# Patient Record
Sex: Female | Born: 1955 | Race: White | Hispanic: No | Marital: Married | Smoking: Former smoker
Health system: Southern US, Community
[De-identification: ages and names within clinical notes are randomized; demographics above are authoritative.]

## PROBLEM LIST (undated history)

## (undated) DIAGNOSIS — E669 Obesity, unspecified: Secondary | ICD-10-CM

## (undated) DIAGNOSIS — K760 Fatty (change of) liver, not elsewhere classified: Secondary | ICD-10-CM

## (undated) DIAGNOSIS — F419 Anxiety disorder, unspecified: Secondary | ICD-10-CM

## (undated) DIAGNOSIS — I10 Essential (primary) hypertension: Secondary | ICD-10-CM

## (undated) DIAGNOSIS — N200 Calculus of kidney: Secondary | ICD-10-CM

## (undated) HISTORY — PX: ABDOMINAL HYSTERECTOMY: SHX81

## (undated) HISTORY — DX: Obesity, unspecified: E66.9

## (undated) HISTORY — DX: Calculus of kidney: N20.0

## (undated) HISTORY — DX: Anxiety disorder, unspecified: F41.9

## (undated) HISTORY — DX: Essential (primary) hypertension: I10

## (undated) HISTORY — DX: Fatty (change of) liver, not elsewhere classified: K76.0

---

## 2006-02-03 ENCOUNTER — Ambulatory Visit: Payer: Self-pay | Admitting: Family Medicine

## 2006-03-27 ENCOUNTER — Ambulatory Visit: Payer: Self-pay | Admitting: Family Medicine

## 2006-04-14 ENCOUNTER — Ambulatory Visit: Payer: Self-pay | Admitting: Family Medicine

## 2006-05-16 DIAGNOSIS — E785 Hyperlipidemia, unspecified: Secondary | ICD-10-CM | POA: Insufficient documentation

## 2006-05-16 DIAGNOSIS — E118 Type 2 diabetes mellitus with unspecified complications: Secondary | ICD-10-CM

## 2006-05-16 DIAGNOSIS — I10 Essential (primary) hypertension: Secondary | ICD-10-CM | POA: Insufficient documentation

## 2006-05-16 DIAGNOSIS — IMO0002 Reserved for concepts with insufficient information to code with codable children: Secondary | ICD-10-CM | POA: Insufficient documentation

## 2006-05-16 DIAGNOSIS — E1165 Type 2 diabetes mellitus with hyperglycemia: Secondary | ICD-10-CM

## 2006-05-23 ENCOUNTER — Ambulatory Visit: Payer: Self-pay | Admitting: Family Medicine

## 2006-05-26 ENCOUNTER — Ambulatory Visit: Payer: Self-pay | Admitting: Family Medicine

## 2006-05-29 ENCOUNTER — Ambulatory Visit: Payer: Self-pay | Admitting: Family Medicine

## 2006-06-20 ENCOUNTER — Telehealth: Payer: Self-pay | Admitting: Family Medicine

## 2006-06-22 ENCOUNTER — Telehealth: Payer: Self-pay | Admitting: Family Medicine

## 2006-06-22 ENCOUNTER — Ambulatory Visit: Payer: Self-pay | Admitting: Family Medicine

## 2006-06-27 ENCOUNTER — Telehealth (INDEPENDENT_AMBULATORY_CARE_PROVIDER_SITE_OTHER): Payer: Self-pay | Admitting: *Deleted

## 2006-07-17 ENCOUNTER — Ambulatory Visit: Payer: Self-pay | Admitting: Family Medicine

## 2006-07-17 LAB — CONVERTED CEMR LAB
Glucose, Urine, Semiquant: 100
Nitrite: NEGATIVE
Protein, U semiquant: >=300
Urobilinogen, UA: 0.2

## 2006-07-21 ENCOUNTER — Telehealth: Payer: Self-pay | Admitting: Family Medicine

## 2006-07-26 ENCOUNTER — Telehealth: Payer: Self-pay | Admitting: Family Medicine

## 2006-08-04 ENCOUNTER — Telehealth: Payer: Self-pay | Admitting: Family Medicine

## 2006-09-21 ENCOUNTER — Ambulatory Visit: Payer: Self-pay | Admitting: Family Medicine

## 2006-09-21 LAB — CONVERTED CEMR LAB: Microalbumin U total vol: ABNORMAL mg/L

## 2006-10-02 ENCOUNTER — Encounter: Payer: Self-pay | Admitting: Family Medicine

## 2006-10-19 ENCOUNTER — Encounter (INDEPENDENT_AMBULATORY_CARE_PROVIDER_SITE_OTHER): Payer: Self-pay | Admitting: Specialist

## 2006-10-19 ENCOUNTER — Ambulatory Visit (HOSPITAL_COMMUNITY): Admission: RE | Admit: 2006-10-19 | Discharge: 2006-10-19 | Payer: Self-pay | Admitting: Surgery

## 2006-10-19 ENCOUNTER — Encounter: Payer: Self-pay | Admitting: Family Medicine

## 2006-10-25 ENCOUNTER — Ambulatory Visit: Payer: Self-pay | Admitting: Family Medicine

## 2006-10-26 ENCOUNTER — Telehealth (INDEPENDENT_AMBULATORY_CARE_PROVIDER_SITE_OTHER): Payer: Self-pay | Admitting: *Deleted

## 2006-10-26 ENCOUNTER — Encounter: Payer: Self-pay | Admitting: Family Medicine

## 2006-10-26 LAB — CONVERTED CEMR LAB
ALT: 24 units/L (ref 0–35)
AST: 15 units/L (ref 0–37)
Albumin: 4.2 g/dL (ref 3.5–5.2)
Alkaline Phosphatase: 76 units/L (ref 39–117)
BUN: 17 mg/dL (ref 6–23)
MCHC: 33.3 g/dL (ref 30.0–36.0)
Platelets: 341 10*3/uL (ref 150–400)
Potassium: 4.4 meq/L (ref 3.5–5.3)
RDW: 13.7 % (ref 11.5–14.0)
Sodium: 138 meq/L (ref 135–145)

## 2006-10-30 ENCOUNTER — Encounter: Payer: Self-pay | Admitting: Family Medicine

## 2006-10-30 LAB — CONVERTED CEMR LAB
HDL: 43 mg/dL (ref >39)
LDL Cholesterol: 86 mg/dL (ref 0–99)

## 2006-11-07 ENCOUNTER — Emergency Department (HOSPITAL_COMMUNITY): Admission: EM | Admit: 2006-11-07 | Discharge: 2006-11-07 | Payer: Self-pay | Admitting: Emergency Medicine

## 2006-11-07 ENCOUNTER — Telehealth: Payer: Self-pay | Admitting: Family Medicine

## 2006-11-27 ENCOUNTER — Telehealth: Payer: Self-pay | Admitting: Family Medicine

## 2006-12-26 ENCOUNTER — Ambulatory Visit: Payer: Self-pay | Admitting: Family Medicine

## 2006-12-26 DIAGNOSIS — E669 Obesity, unspecified: Secondary | ICD-10-CM | POA: Insufficient documentation

## 2006-12-27 ENCOUNTER — Encounter: Payer: Self-pay | Admitting: Family Medicine

## 2006-12-27 LAB — CONVERTED CEMR LAB
CO2: 21 meq/L (ref 19–32)
Calcium: 10.3 mg/dL (ref 8.4–10.5)
Sodium: 141 meq/L (ref 135–145)

## 2007-01-02 ENCOUNTER — Telehealth: Payer: Self-pay | Admitting: Family Medicine

## 2007-01-02 ENCOUNTER — Encounter: Payer: Self-pay | Admitting: Family Medicine

## 2007-01-10 ENCOUNTER — Telehealth: Payer: Self-pay | Admitting: Family Medicine

## 2007-01-10 ENCOUNTER — Ambulatory Visit: Payer: Self-pay | Admitting: Family Medicine

## 2007-01-10 LAB — CONVERTED CEMR LAB: Hgb A1c MFr Bld: 7 %

## 2007-01-11 ENCOUNTER — Telehealth: Payer: Self-pay | Admitting: Family Medicine

## 2007-01-15 ENCOUNTER — Telehealth: Payer: Self-pay | Admitting: Family Medicine

## 2007-01-17 ENCOUNTER — Encounter: Payer: Self-pay | Admitting: Family Medicine

## 2007-01-17 ENCOUNTER — Encounter: Admission: RE | Admit: 2007-01-17 | Discharge: 2007-01-17 | Payer: Self-pay | Admitting: Family Medicine

## 2007-01-23 ENCOUNTER — Telehealth: Payer: Self-pay | Admitting: Family Medicine

## 2007-02-07 ENCOUNTER — Ambulatory Visit: Payer: Self-pay | Admitting: Family Medicine

## 2007-02-12 ENCOUNTER — Telehealth: Payer: Self-pay | Admitting: Family Medicine

## 2007-02-20 ENCOUNTER — Telehealth (INDEPENDENT_AMBULATORY_CARE_PROVIDER_SITE_OTHER): Payer: Self-pay | Admitting: *Deleted

## 2007-02-21 ENCOUNTER — Encounter: Admission: RE | Admit: 2007-02-21 | Discharge: 2007-02-21 | Payer: Self-pay | Admitting: Oncology

## 2007-03-14 ENCOUNTER — Ambulatory Visit: Payer: Self-pay | Admitting: Family Medicine

## 2007-03-14 DIAGNOSIS — F411 Generalized anxiety disorder: Secondary | ICD-10-CM | POA: Insufficient documentation

## 2007-04-06 ENCOUNTER — Telehealth: Payer: Self-pay | Admitting: Family Medicine

## 2007-04-16 ENCOUNTER — Ambulatory Visit (HOSPITAL_COMMUNITY): Payer: Self-pay | Admitting: Psychiatry

## 2007-04-19 ENCOUNTER — Telehealth: Payer: Self-pay | Admitting: Family Medicine

## 2007-04-30 ENCOUNTER — Ambulatory Visit (HOSPITAL_COMMUNITY): Payer: Self-pay | Admitting: Psychology

## 2007-05-02 ENCOUNTER — Telehealth: Payer: Self-pay | Admitting: Family Medicine

## 2007-05-04 ENCOUNTER — Ambulatory Visit: Payer: Self-pay | Admitting: Family Medicine

## 2007-05-04 LAB — CONVERTED CEMR LAB: Hgb A1c MFr Bld: 6.5 %

## 2007-05-30 ENCOUNTER — Ambulatory Visit: Payer: Self-pay | Admitting: Family Medicine

## 2007-06-08 ENCOUNTER — Telehealth: Payer: Self-pay | Admitting: Family Medicine

## 2007-11-13 ENCOUNTER — Ambulatory Visit: Payer: Self-pay | Admitting: Family Medicine

## 2007-11-13 DIAGNOSIS — R809 Proteinuria, unspecified: Secondary | ICD-10-CM | POA: Insufficient documentation

## 2007-11-13 LAB — CONVERTED CEMR LAB: Hgb A1c MFr Bld: 7.1 %

## 2007-12-24 ENCOUNTER — Telehealth (INDEPENDENT_AMBULATORY_CARE_PROVIDER_SITE_OTHER): Payer: Self-pay | Admitting: *Deleted

## 2008-01-14 ENCOUNTER — Ambulatory Visit: Payer: Self-pay | Admitting: Family Medicine

## 2008-01-14 DIAGNOSIS — L570 Actinic keratosis: Secondary | ICD-10-CM | POA: Insufficient documentation

## 2008-01-25 ENCOUNTER — Telehealth (INDEPENDENT_AMBULATORY_CARE_PROVIDER_SITE_OTHER): Payer: Self-pay | Admitting: *Deleted

## 2008-01-29 ENCOUNTER — Encounter: Payer: Self-pay | Admitting: Family Medicine

## 2008-01-31 LAB — CONVERTED CEMR LAB
CO2: 21 meq/L (ref 19–32)
Cholesterol: 198 mg/dL (ref 0–200)
Creatinine, Ser: 0.95 mg/dL (ref 0.40–1.20)
Glucose, Bld: 200 mg/dL — ABNORMAL HIGH (ref 70–99)
Total Bilirubin: 0.7 mg/dL (ref 0.3–1.2)
Total CHOL/HDL Ratio: 5.1
Triglycerides: 156 mg/dL — ABNORMAL HIGH (ref <150)
VLDL: 31 mg/dL (ref 0–40)

## 2008-02-01 ENCOUNTER — Encounter: Admission: RE | Admit: 2008-02-01 | Discharge: 2008-02-01 | Payer: Self-pay | Admitting: Family Medicine

## 2008-02-01 ENCOUNTER — Ambulatory Visit: Payer: Self-pay | Admitting: Family Medicine

## 2008-02-12 ENCOUNTER — Telehealth: Payer: Self-pay | Admitting: Family Medicine

## 2008-02-13 ENCOUNTER — Ambulatory Visit: Payer: Self-pay | Admitting: Family Medicine

## 2008-03-03 ENCOUNTER — Encounter: Admission: RE | Admit: 2008-03-03 | Discharge: 2008-03-03 | Payer: Self-pay | Admitting: Family Medicine

## 2008-04-24 ENCOUNTER — Ambulatory Visit: Payer: Self-pay | Admitting: Family Medicine

## 2008-05-21 ENCOUNTER — Telehealth (INDEPENDENT_AMBULATORY_CARE_PROVIDER_SITE_OTHER): Payer: Self-pay | Admitting: *Deleted

## 2008-06-27 ENCOUNTER — Encounter: Payer: Self-pay | Admitting: Family Medicine

## 2008-08-08 HISTORY — PX: LITHOTRIPSY: SUR834

## 2008-09-16 ENCOUNTER — Ambulatory Visit: Payer: Self-pay | Admitting: Family Medicine

## 2008-09-16 ENCOUNTER — Encounter: Admission: RE | Admit: 2008-09-16 | Discharge: 2008-09-16 | Payer: Self-pay | Admitting: Family Medicine

## 2008-12-15 ENCOUNTER — Ambulatory Visit: Payer: Self-pay | Admitting: Family Medicine

## 2008-12-15 LAB — CONVERTED CEMR LAB: Hgb A1c MFr Bld: 7.1 %

## 2008-12-21 ENCOUNTER — Telehealth: Payer: Self-pay | Admitting: Family Medicine

## 2009-01-15 ENCOUNTER — Telehealth: Payer: Self-pay | Admitting: Family Medicine

## 2009-02-13 ENCOUNTER — Ambulatory Visit: Payer: Self-pay | Admitting: Family Medicine

## 2009-02-13 ENCOUNTER — Other Ambulatory Visit: Admission: RE | Admit: 2009-02-13 | Discharge: 2009-02-13 | Payer: Self-pay | Admitting: Family Medicine

## 2009-02-13 ENCOUNTER — Encounter: Payer: Self-pay | Admitting: Family Medicine

## 2009-02-13 DIAGNOSIS — Z780 Asymptomatic menopausal state: Secondary | ICD-10-CM | POA: Insufficient documentation

## 2009-02-14 ENCOUNTER — Encounter: Payer: Self-pay | Admitting: Family Medicine

## 2009-02-16 LAB — CONVERTED CEMR LAB
ALT: 27 units/L (ref 0–35)
CO2: 24 meq/L (ref 19–32)
Calcium: 9.5 mg/dL (ref 8.4–10.5)
Chloride: 107 meq/L (ref 96–112)
Cholesterol: 195 mg/dL (ref 0–200)
Sodium: 141 meq/L (ref 135–145)
Total Protein: 7.1 g/dL (ref 6.0–8.3)
VLDL: 35 mg/dL (ref 0–40)

## 2009-02-23 ENCOUNTER — Telehealth: Payer: Self-pay | Admitting: Family Medicine

## 2009-04-06 ENCOUNTER — Telehealth: Payer: Self-pay | Admitting: Family Medicine

## 2009-04-20 ENCOUNTER — Encounter: Admission: RE | Admit: 2009-04-20 | Discharge: 2009-04-20 | Payer: Self-pay | Admitting: Family Medicine

## 2009-04-20 ENCOUNTER — Encounter: Payer: Self-pay | Admitting: Family Medicine

## 2009-04-22 ENCOUNTER — Encounter: Payer: Self-pay | Admitting: Family Medicine

## 2009-04-22 LAB — HM COLONOSCOPY

## 2009-04-23 ENCOUNTER — Encounter: Payer: Self-pay | Admitting: Family Medicine

## 2009-04-23 DIAGNOSIS — K5730 Diverticulosis of large intestine without perforation or abscess without bleeding: Secondary | ICD-10-CM | POA: Insufficient documentation

## 2009-04-23 DIAGNOSIS — D126 Benign neoplasm of colon, unspecified: Secondary | ICD-10-CM | POA: Insufficient documentation

## 2009-04-27 ENCOUNTER — Encounter: Payer: Self-pay | Admitting: Family Medicine

## 2009-06-02 ENCOUNTER — Encounter: Admission: RE | Admit: 2009-06-02 | Discharge: 2009-06-02 | Payer: Self-pay | Admitting: Family Medicine

## 2009-06-02 ENCOUNTER — Ambulatory Visit: Payer: Self-pay | Admitting: Family Medicine

## 2009-06-02 DIAGNOSIS — R059 Cough, unspecified: Secondary | ICD-10-CM | POA: Insufficient documentation

## 2009-06-02 DIAGNOSIS — IMO0002 Reserved for concepts with insufficient information to code with codable children: Secondary | ICD-10-CM | POA: Insufficient documentation

## 2009-06-02 DIAGNOSIS — R0902 Hypoxemia: Secondary | ICD-10-CM | POA: Insufficient documentation

## 2009-06-02 DIAGNOSIS — R05 Cough: Secondary | ICD-10-CM

## 2009-06-04 ENCOUNTER — Encounter: Admission: RE | Admit: 2009-06-04 | Discharge: 2009-08-03 | Payer: Self-pay | Admitting: Family Medicine

## 2009-06-16 ENCOUNTER — Ambulatory Visit: Payer: Self-pay | Admitting: Family Medicine

## 2009-06-25 ENCOUNTER — Telehealth: Payer: Self-pay | Admitting: Family Medicine

## 2009-06-26 ENCOUNTER — Encounter: Payer: Self-pay | Admitting: Family Medicine

## 2009-09-11 ENCOUNTER — Ambulatory Visit: Payer: Self-pay | Admitting: Family Medicine

## 2009-09-21 ENCOUNTER — Telehealth: Payer: Self-pay | Admitting: Family Medicine

## 2009-09-24 ENCOUNTER — Telehealth: Payer: Self-pay | Admitting: Family Medicine

## 2009-09-30 ENCOUNTER — Telehealth: Payer: Self-pay | Admitting: Family Medicine

## 2009-10-05 ENCOUNTER — Telehealth: Payer: Self-pay | Admitting: Family Medicine

## 2009-10-08 ENCOUNTER — Telehealth (INDEPENDENT_AMBULATORY_CARE_PROVIDER_SITE_OTHER): Payer: Self-pay | Admitting: *Deleted

## 2009-10-13 ENCOUNTER — Ambulatory Visit: Payer: Self-pay | Admitting: Family Medicine

## 2009-10-13 DIAGNOSIS — J209 Acute bronchitis, unspecified: Secondary | ICD-10-CM | POA: Insufficient documentation

## 2009-10-16 ENCOUNTER — Telehealth: Payer: Self-pay | Admitting: Family Medicine

## 2009-10-29 ENCOUNTER — Ambulatory Visit: Payer: Self-pay | Admitting: Family Medicine

## 2009-10-29 ENCOUNTER — Encounter: Admission: RE | Admit: 2009-10-29 | Discharge: 2009-10-29 | Payer: Self-pay | Admitting: Family Medicine

## 2009-10-29 DIAGNOSIS — IMO0002 Reserved for concepts with insufficient information to code with codable children: Secondary | ICD-10-CM | POA: Insufficient documentation

## 2009-11-02 ENCOUNTER — Telehealth: Payer: Self-pay | Admitting: Family Medicine

## 2009-11-09 ENCOUNTER — Ambulatory Visit: Payer: Self-pay | Admitting: Family Medicine

## 2009-12-17 ENCOUNTER — Encounter: Payer: Self-pay | Admitting: Family Medicine

## 2009-12-22 ENCOUNTER — Ambulatory Visit: Payer: Self-pay | Admitting: Family Medicine

## 2010-02-01 LAB — HM DIABETES EYE EXAM

## 2010-02-04 ENCOUNTER — Encounter: Payer: Self-pay | Admitting: Family Medicine

## 2010-02-11 ENCOUNTER — Telehealth (INDEPENDENT_AMBULATORY_CARE_PROVIDER_SITE_OTHER): Payer: Self-pay | Admitting: *Deleted

## 2010-02-27 ENCOUNTER — Ambulatory Visit: Payer: Self-pay | Admitting: Emergency Medicine

## 2010-02-27 DIAGNOSIS — E1165 Type 2 diabetes mellitus with hyperglycemia: Secondary | ICD-10-CM

## 2010-02-27 DIAGNOSIS — IMO0001 Reserved for inherently not codable concepts without codable children: Secondary | ICD-10-CM | POA: Insufficient documentation

## 2010-02-27 LAB — CONVERTED CEMR LAB
Bilirubin Urine: NEGATIVE
Blood Glucose, Fingerstick: 436
Ketones, urine, test strip: NEGATIVE
Nitrite: NEGATIVE
Protein, U semiquant: 30
Time of First Serial CBG: 1035
WBC Urine, dipstick: NEGATIVE

## 2010-02-28 ENCOUNTER — Encounter: Payer: Self-pay | Admitting: Emergency Medicine

## 2010-03-19 ENCOUNTER — Ambulatory Visit: Payer: Self-pay | Admitting: Family Medicine

## 2010-03-19 LAB — CONVERTED CEMR LAB
Albumin/Creatinine Ratio, Urine, POC: >300
Creatinine,U: 50 mg/dL
Hgb A1c MFr Bld: 10.8 %

## 2010-03-22 ENCOUNTER — Telehealth: Payer: Self-pay | Admitting: Family Medicine

## 2010-03-24 ENCOUNTER — Telehealth (INDEPENDENT_AMBULATORY_CARE_PROVIDER_SITE_OTHER): Payer: Self-pay | Admitting: *Deleted

## 2010-03-26 ENCOUNTER — Telehealth: Payer: Self-pay | Admitting: Family Medicine

## 2010-03-31 ENCOUNTER — Encounter: Payer: Self-pay | Admitting: Family Medicine

## 2010-03-31 ENCOUNTER — Telehealth (INDEPENDENT_AMBULATORY_CARE_PROVIDER_SITE_OTHER): Payer: Self-pay | Admitting: *Deleted

## 2010-04-23 ENCOUNTER — Ambulatory Visit: Payer: Self-pay | Admitting: Family Medicine

## 2010-04-26 ENCOUNTER — Ambulatory Visit: Payer: Self-pay | Admitting: Family Medicine

## 2010-04-26 DIAGNOSIS — R7309 Other abnormal glucose: Secondary | ICD-10-CM | POA: Insufficient documentation

## 2010-04-26 DIAGNOSIS — R748 Abnormal levels of other serum enzymes: Secondary | ICD-10-CM | POA: Insufficient documentation

## 2010-04-26 LAB — CONVERTED CEMR LAB
ALT: 64 units/L — ABNORMAL HIGH (ref 0–35)
AST: 52 units/L — ABNORMAL HIGH (ref 0–37)
Albumin: 4.2 g/dL (ref 3.5–5.2)
Alkaline Phosphatase: 85 units/L (ref 39–117)
Glucose, Bld: 322 mg/dL — ABNORMAL HIGH (ref 70–99)
LDL Cholesterol: 102 mg/dL — ABNORMAL HIGH (ref 0–99)
Potassium: 4.7 meq/L (ref 3.5–5.3)
Sodium: 140 meq/L (ref 135–145)
Total Protein: 7.2 g/dL (ref 6.0–8.3)
VLDL: 24 mg/dL (ref 0–40)

## 2010-05-21 ENCOUNTER — Telehealth: Payer: Self-pay | Admitting: Family Medicine

## 2010-06-14 ENCOUNTER — Telehealth: Payer: Self-pay | Admitting: Family Medicine

## 2010-06-15 ENCOUNTER — Encounter: Admission: RE | Admit: 2010-06-15 | Discharge: 2010-06-15 | Payer: Self-pay | Admitting: Family Medicine

## 2010-06-15 ENCOUNTER — Ambulatory Visit: Payer: Self-pay | Admitting: Family Medicine

## 2010-06-15 DIAGNOSIS — R1013 Epigastric pain: Secondary | ICD-10-CM | POA: Insufficient documentation

## 2010-06-15 DIAGNOSIS — K7689 Other specified diseases of liver: Secondary | ICD-10-CM | POA: Insufficient documentation

## 2010-06-15 LAB — HM MAMMOGRAPHY: HM Mammogram: NEGATIVE

## 2010-06-16 ENCOUNTER — Telehealth: Payer: Self-pay | Admitting: Family Medicine

## 2010-06-17 ENCOUNTER — Encounter: Payer: Self-pay | Admitting: Family Medicine

## 2010-06-18 ENCOUNTER — Encounter: Admission: RE | Admit: 2010-06-18 | Discharge: 2010-06-18 | Payer: Self-pay | Admitting: Family Medicine

## 2010-06-18 LAB — CONVERTED CEMR LAB
ALT: 69 units/L — ABNORMAL HIGH (ref 0–35)
AST: 69 units/L — ABNORMAL HIGH (ref 0–37)
CO2: 25 meq/L (ref 19–32)
Calcium: 9.6 mg/dL (ref 8.4–10.5)
Chloride: 103 meq/L (ref 96–112)
Potassium: 4.8 meq/L (ref 3.5–5.3)
Sodium: 137 meq/L (ref 135–145)
Total Protein: 7.2 g/dL (ref 6.0–8.3)

## 2010-08-10 ENCOUNTER — Ambulatory Visit
Admission: RE | Admit: 2010-08-10 | Discharge: 2010-08-10 | Payer: Self-pay | Source: Home / Self Care | Attending: Family Medicine | Admitting: Family Medicine

## 2010-08-10 DIAGNOSIS — R112 Nausea with vomiting, unspecified: Secondary | ICD-10-CM | POA: Insufficient documentation

## 2010-08-10 DIAGNOSIS — K5289 Other specified noninfective gastroenteritis and colitis: Secondary | ICD-10-CM | POA: Insufficient documentation

## 2010-08-11 ENCOUNTER — Encounter: Payer: Self-pay | Admitting: Family Medicine

## 2010-08-11 LAB — CONVERTED CEMR LAB
ALT: 27 units/L (ref 0–35)
Alkaline Phosphatase: 73 units/L (ref 39–117)
Creatinine, Ser: 1.17 mg/dL (ref 0.40–1.20)
Lipase: 12 units/L (ref 0–75)
Sodium: 133 meq/L — ABNORMAL LOW (ref 135–145)
Total Bilirubin: 0.9 mg/dL (ref 0.3–1.2)
Total Protein: 7.3 g/dL (ref 6.0–8.3)

## 2010-08-27 ENCOUNTER — Ambulatory Visit
Admission: RE | Admit: 2010-08-27 | Discharge: 2010-08-27 | Payer: Self-pay | Source: Home / Self Care | Attending: Family Medicine | Admitting: Family Medicine

## 2010-08-27 LAB — CONVERTED CEMR LAB: Hgb A1c MFr Bld: 11.7 %

## 2010-08-29 ENCOUNTER — Encounter: Payer: Self-pay | Admitting: Family Medicine

## 2010-09-07 NOTE — Progress Notes (Signed)
Summary: genetic counseling  ---- Converted from flag ---- ---- 09/24/2009 1:43 PM, Payton Spark CMA wrote: Pt aware  ---- 09/24/2009 8:16 AM, Seymour Bars DO wrote: ok, I talked to Jola Babinski.  She advised calling Annia Friendly at [phone] x [phone] and leaving a message.  Harriett Sine is a Dentist.  This is the best approach.    ---- 09/23/2009 11:57 AM, Payton Spark CMA wrote: Pt called about screening for pancreatic cancer. Please advise. ------------------------------

## 2010-09-07 NOTE — Assessment & Plan Note (Signed)
Summary: no retinopathy   

## 2010-09-07 NOTE — Assessment & Plan Note (Signed)
Summary: FLU SHOT  Nurse Visit   Vitals Entered By: Payton Spark CMA (April 23, 2010 10:30 AM)  Allergies: 1)  ! Hydrochlorothiazide (Hydrochlorothiazide) 2)  ! * Victoza 3)  ! * Metformin  Orders Added: 1)  Admin 1st Vaccine [90471] 2)  Flu Vaccine 42yrs + [82956] Flu Vaccine Consent Questions     Do you have a history of severe allergic reactions to this vaccine? no    Any prior history of allergic reactions to egg and/or gelatin? no    Do you have a sensitivity to the preservative Thimersol? no    Do you have a past history of Guillan-Barre Syndrome? no    Do you currently have an acute febrile illness? no    Have you ever had a severe reaction to latex? no    Vaccine information given and explained to patient? yes    Are you currently pregnant? no    Lot Number:[id number]   Exp Date:02/05/2011   Site Given  Left Deltoid IM   .lbflu

## 2010-09-07 NOTE — Progress Notes (Signed)
Summary: Dyspnea and rapid heart rate  Phone Note Call from Patient   Caller: Patient Summary of Call: Pt called stating she has had rapid heart beat and dyspnea this afternoon while out shopping. I advised Pt to go to ED. Pt states that you told her to call here first bc she has been to the ED often. I explained that you were unavailable and she should go to the closest ED or UC. Pt agreed.  Initial call taken by: Payton Spark CMA,  May 21, 2010 2:32 PM  Follow-up for Phone Call        yes, agree with going to ED now. Follow-up by: Seymour Bars DO,  May 21, 2010 2:46 PM

## 2010-09-07 NOTE — Progress Notes (Signed)
Summary: call a nurse  [facility] Triage Call Report Triage Record Num: [id number] Operator: Tomasita Crumble Patient Name: Erin [name] Call Date & Time: 06/12/2010 6:00:51PM Patient Phone: [phone] PCP: Seymour Bars, DO Patient Gender: Female PCP Fax : [phone] Patient DOB: 12-14-1955 [facility] Name[facility] Reason for Call: Pt. calling about abdominal pain; onset  ~ 1 month. Increased pain 11/5; intermittent. Sharp pain in left upper abdomen - caller rates stabbing pains at 8 that lasts only a second, then aching in between the sharp pains; nausea, mild dizzinesss. Last voided at 1700. Reports battery in glucose meter is dead; most recent reading 268 "last week". Advised ED per Diabetes: Gastrointestinal Problems protocol. At the conclusion of the call pt. states she takes insulin. States plan to [facility]. Protocol(s) Used: Diabetes: Gastrointestinal Problems Recommended Outcome per Protocol: See ED Immediately Reason for Outcome: Signs of dehydration Care Advice:  ~ 06/12/2010 6:11:12PM Page 1 of 1 [email or web address]  Appended Document: call a nurse Set up OV on WED this week to review sugar readings.  Seymour Bars, D.O.  Appended Document: call a nurse [facility] informing Pt of the above

## 2010-09-07 NOTE — Progress Notes (Signed)
Summary: No better  Phone Note Call from Patient   Caller: Patient Summary of Call: Pt states she is still not feeling any better. C/O  fatigue, muscle aches, productive cough, HA. Earache. Clear nasal drainage. SOB with exertion. Facial congestion and chest tightness. She also states that she has been vomiting from the Victoza. Please advise. Initial call taken by: Payton Spark CMA,  November 02, 2009 11:29 AM  Follow-up for Phone Call        See if she can cut back on the Victoza to 0.3 mg / day (3 clicks) below the 0.6 line on the pen to reduce nausea risk.  I will add an abx but if still not better. she will need to see pulm or ENT for f/u. Follow-up by: Seymour Bars DO,  November 02, 2009 11:51 AM    New/Updated Medications: LEVAQUIN 750 MG TABS (LEVOFLOXACIN) 1 tab by mouth daily x 5 days Prescriptions: LEVAQUIN 750 MG TABS (LEVOFLOXACIN) 1 tab by mouth daily x 5 days  #5 tabs x 0   Entered and Authorized by:   Seymour Bars DO   Signed by:   Seymour Bars DO on 11/02/2009   Method used:   Electronically to        [facility]. [phone]* (retail)       [address]       [place], [place]  [address]       Ph: [phone]       Fax: [phone]   RxID:   [id number]   Appended Document: No better pt aware

## 2010-09-07 NOTE — Progress Notes (Signed)
Summary: High sugar readings  Phone Note Call from Patient   Caller: Patient Summary of Call: Pt states she is taking all DM meds as directed and her sugars are still running over 300. Please advise. Initial call taken by: Payton Spark CMA,  March 31, 2010 9:12 AM  Follow-up for Phone Call        Increase Levemir to 35 units once a day and call with readings on Monday. Follow-up by: Seymour Bars DO,  March 31, 2010 9:24 AM  Additional Follow-up for Phone Call Additional follow up Details #1::        Pt aware of the above Additional Follow-up by: Payton Spark CMA,  March 31, 2010 10:45 AM    New/Updated Medications: LEVEMIR FLEXPEN 100 UNIT/ML SOLN (INSULIN DETEMIR) 35  units [place] injection every morning

## 2010-09-07 NOTE — Assessment & Plan Note (Signed)
Summary: f/u high sugars   Vital Signs:  Patient profile:   55 year old female Menstrual status:  hysterectomy Height:      65.75 inches Weight:      231 pounds BMI:     37.70 O2 Sat:      96 % on Room air Pulse rate:   79 / minute BP sitting:   106 / 70  (left arm) Cuff size:   large  Vitals Entered By: Payton Spark CMA (April 26, 2010 2:00 PM)  O2 Flow:  Room air CC: F/U sugars. Has not been taking lisinopril.   Primary Care Provider:  Seymour Bars D.O.  CC:  F/U sugars. Has not been taking lisinopril.Marland Kitchen  History of Present Illness: 55 yo WF presents for f/u T2DM.  She admits to fair diet.  She has only been using 25 units of Levemir instead of 35 units because she felt 'low' one day and it scared her.  She never did check her sugar this day.  She had been using Levemir in the evenings.  Denies skipping meals.  Under stress w/ her mom's failing health with no physical activity.    She is still on glipizide twice a day with breakfast and dinner.  Her LFTs came back elevated on labs here and at the ED recently.  She has not been evaluated for fatty liver dz.  Off Lisiinopril and BP is doing well on AZor and Atenolol.   Current Medications (verified): 1)  Glucotrol 5 Mg Tabs (Glipizide) .Marland Kitchen.. 1 Tab By Mouth Two Times A Day With Meals 2)  Simvastatin 40 Mg Tabs (Simvastatin) .Marland Kitchen.. 1 Tab By Mouth Qhs 3)  Azor 5-40 Mg Tabs (Amlodipine-Olmesartan) .Marland Kitchen.. 1 Tab By Mouth Daily 4)  Aspir-Low 81 Mg Tbec (Aspirin) .Marland Kitchen.. 1 Tab By Mouth Daily 5)  Atenolol 50 Mg Tabs (Atenolol) .... Take 1 Tab Po Once Daily 6)  Alprazolam 0.5 Mg Tabs (Alprazolam) .Marland Kitchen.. 1 Tab By Mouth Two Times A Day As Needed Anxiety 7)  Levemir Flexpen 100 Unit/ml Soln (Insulin Detemir) .... 35  Units [place] Injection Every Morning 8)  Lisinopril 10 Mg Tabs (Lisinopril) .Marland Kitchen.. 1 Tab By Mouth Daily 9)  Pen Needles 1/2" 29g X 12mm Misc (Insulin Pen Needle) .... Use Daily As Directed  Allergies (verified): 1)  !  Hydrochlorothiazide (Hydrochlorothiazide) 2)  ! * Victoza 3)  ! * Metformin  Past History:  Past Medical History: Reviewed history from 02/27/2010 and no changes required. Hx of kidney stones DM HTN obesity anxiety  Echo 5-09 with [facility]  Past Surgical History: Reviewed history from 02/13/2009 and no changes required. Complete hysterectomy with BSO  for dysplasia  kidney stones in 20s, 30s, s/sec lithotripsy  in [place] 08-2008  Social History: Reviewed history from 02/27/2010 and no changes required. 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   3 adult children with prior spouse.  Quit smoking 2005, no etoh, no drugs,  2 caff/day, no reg exercise.  Review of Systems      See HPI  Physical Exam  General:  alert, well-developed, well-nourished, and well-hydrated.  obese Head:  normocephalic and atraumatic.   Mouth:  pharynx pink and moist.   Neck:  no masses.   Abdomen:  soft, non-tender, normal bowel sounds, no distention, no hepatomegaly, and no splenomegaly.   Skin:  color normal.   Psych:  good eye contact, not anxious appearing, and not depressed appearing.     Impression &  Recommendations:  Problem # 1:  HYPERGLYCEMIA (ICD-790.29) Reviewed her plan.  She had a lot of fears of having lows and had cut her own dose.   Will change her to Levemir 25 units two times a day from once a day.  Continue glipizide.  Work on diabetic diet.  Gave parameters to look at.  Call if any problems.  RTC in 3 wks for f/u. Her updated medication list for this problem includes:    Glucotrol 5 Mg Tabs (Glipizide) .Marland Kitchen... 1 tab by mouth two times a day with meals    Levemir Flexpen 100 Unit/ml Soln (Insulin detemir) .Marland Kitchen... 25 units [place] injection two times a day  Problem # 2:  OTHER NONSPECIFIC ABNORMAL SERUM ENZYME LEVELS (ICD-790.5) Likely NAFLD.  2nd x elevated.  Will get RUQ u/s as I suspect she has NAFLD.   Orders: T-Ultrasound  Abdominal, Complete (45409)  Problem # 3:  HYPERTENSION, BENIGN SYSTEMIC (ICD-401.1) BP looks good on just Azor and ATenolol. The following medications were removed from the medication list:    Lisinopril 10 Mg Tabs (Lisinopril) .Marland Kitchen... 1 tab by mouth daily Her updated medication list for this problem includes:    Azor 5-40 Mg Tabs (Amlodipine-olmesartan) .Marland Kitchen... 1 tab by mouth daily    Atenolol 50 Mg Tabs (Atenolol) .Marland Kitchen... Take 1 tab po once daily  BP today: 106/70 Prior BP: 137/85 (03/19/2010)  Prior 10 Yr Risk Heart Disease: 11 % (04/24/2008)  Labs Reviewed: K+: 4.7 (04/23/2010) Creat: : 0.93 (04/23/2010)   Chol: 164 (04/23/2010)   HDL: 38 (04/23/2010)   LDL: 102 (04/23/2010)   TG: 122 (04/23/2010)  Complete Medication List: 1)  Glucotrol 5 Mg Tabs (Glipizide) .Marland Kitchen.. 1 tab by mouth two times a day with meals 2)  Simvastatin 40 Mg Tabs (Simvastatin) .Marland Kitchen.. 1 tab by mouth qhs 3)  Azor 5-40 Mg Tabs (Amlodipine-olmesartan) .Marland Kitchen.. 1 tab by mouth daily 4)  Aspir-low 81 Mg Tbec (Aspirin) .Marland Kitchen.. 1 tab by mouth daily 5)  Atenolol 50 Mg Tabs (Atenolol) .... Take 1 tab po once daily 6)  Alprazolam 0.5 Mg Tabs (Alprazolam) .Marland Kitchen.. 1 tab by mouth two times a day as needed anxiety 7)  Levemir Flexpen 100 Unit/ml Soln (Insulin detemir) .... 25 units [place] injection two times a day 8)  Pen Needles 1/2" 29g X 12mm Misc (Insulin pen needle) .... Use daily as directed  Patient Instructions: 1)  Increase LEVEMIR frequency to morning and bedtime.-- 25 units with EACH injection. 2)  Stay on Glipizide with breakfast and dinner. 3)  Work on diabetic diet. 4)  AM fasting goal is 80-120. 5)  2 hr after dinner goal is <150. 6)  Call if running >350 or <70. 7)  Set up abdominal ultrasound downstairs to look for fatty liver dz. 8)  Return to review sugar readings in 3 wks.

## 2010-09-07 NOTE — Progress Notes (Signed)
Summary: CALL A NURSE  Phone Note From Other Clinic   Caller: CALL A NURSE Summary of Call: [facility] Triage Call Report Triage Record Num: [id number] Operator: Dayton Martes Patient Name: Jaquesha Boroff Call Date & Time: 03/25/2010 5:27:33PM Patient Phone: [phone] PCP: Seymour Bars, DO Patient Gender: Female PCP Fax : [phone] Patient DOB: Dec 19, 1955 [facility] Name[facility] Reason for Call: Pt sts that she feels very weak and her throat feels tight and "I can't lift my arms." RN called [facility] for pt. RN stayed on phone with pt. RN called pt's daughter at her request and asked her to come get her son. [id number] arrived. Protocol(s) Used: Allergic Reaction, Severe Recommended Outcome per Protocol: Activate EMS 911 Reason for Outcome: New or worsening signs and symptoms that may indicate shock Care Advice:  ~ Do not give the patient anything to eat or drink. Lay the person down and elevate legs at least 12 inches (30 cm) above level of heart. Cover to help maintain body temperature.  ~ Initial call taken by: Payton Spark CMA,  March 26, 2010 8:32 AM

## 2010-09-07 NOTE — Assessment & Plan Note (Signed)
Summary: poorly controlled DM   Vital Signs:  Patient profile:   55 year old female Menstrual status:  hysterectomy Height:      65.75 inches Weight:      230 pounds BMI:     37.54 O2 Sat:      96 % on Room air Pulse rate:   77 / minute BP sitting:   137 / 85  (left arm) Cuff size:   large  Vitals Entered By: Payton Spark CMA (March 19, 2010 1:05 PM)  O2 Flow:  Room air  CC: DM f/u    Primary Care Provider:  Seymour Bars D.O.  CC:  DM f/u .  History of Present Illness: 55 yo WF presents for f/u diabetes.  She stopped her Victoza due to N/V.  She claims to have been on Glipizide two times a day and having fair dietary adherence but her sugar has been running in the 400s.  She still has her testing supplies.  She has failed to lose any wt.  Denies blurry vision, CP, SOB, paresthesias or polyuria.  She never started the Metformin Dr Orson Aloe prescribed 2 wks ago because she had swelling on it in the past.  She is emotionally upset b/c her mom is dying from pancreatic cancer.  Hospice is involved but she has not done cousneling.  She is taking her xanax on a regular basis and has refused SSRIs.      Current Medications (verified): 1)  Glucotrol 5 Mg Tabs (Glipizide) .Marland Kitchen.. 1 Tab By Mouth Two Times A Day With Meals 2)  Simvastatin 40 Mg Tabs (Simvastatin) .Marland Kitchen.. 1 Tab By Mouth Qhs 3)  Azor 5-40 Mg  Tabs (Amlodipine-Olmesartan) .Marland Kitchen.. 1 Tab By Mouth Daily 4)  Aspir-Low 81 Mg Tbec (Aspirin) .Marland Kitchen.. 1 Tab By Mouth Daily 5)  True Care Test Strips .... Use Twice Daily As Directed 6)  Atenolol 50 Mg Tabs (Atenolol) .... Take 1 Tab Po Once Daily 7)  Accu-Check Aviva .... Glucometer Use As Directed Dx 250.00 8)  Accu-Check Aviva Test Strips .... Use Twice Daily As Directed 9)  Alprazolam 0.5 Mg Tabs (Alprazolam) .Marland Kitchen.. 1 Tab By Mouth Two Times A Day As Needed Anxiety 10)  Pen Needles 31g X 6 Mm Misc (Insulin Pen Needle) .... Use Daily As Directed 11)  Metformin Hcl 500 Mg Tabs (Metformin Hcl)  .Marland Kitchen.. 1 Tab By Mouth Bid  Allergies (verified): 1)  ! Hydrochlorothiazide (Hydrochlorothiazide) 2)  ! * Victoza 3)  ! * Metformin  Past History:  Past Medical History: Reviewed history from 02/27/2010 and no changes required. Hx of kidney stones DM HTN obesity anxiety  Echo 5-09 with [facility]  Past Surgical History: Reviewed history from 02/13/2009 and no changes required. Complete hysterectomy with BSO  for dysplasia  kidney stones in 20s, 30s, s/sec lithotripsy  in [place] 08-2008  Social History: Reviewed history from 02/27/2010 and no changes required. 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   3 adult children with prior spouse.  Quit smoking 2005, no etoh, no drugs,  2 caff/day, no reg exercise.  Review of Systems      See HPI  Physical Exam  General:  alert, well-developed, well-nourished, and well-hydrated.   Head:  normocephalic and atraumatic.   Eyes:  pupils equal, pupils round, and pupils reactive to light.   Mouth:  pharynx pink and moist.   Lungs:  Normal respiratory effort, chest expands symmetrically. Lungs are clear to auscultation, no  crackles or wheezes. dry cough Heart:  Normal rate and regular rhythm. S1 and S2 normal without gallop, murmur, click, rub or other extra sounds. Pulses:  2+ radial and pedal pulses Extremities:  no E/C/C Skin:  color normal.   Cervical Nodes:  No lymphadenopathy noted Psych:  depressed affect.    Diabetes Management Exam:    Foot Exam (with socks and/or shoes not present):       Sensory-Pinprick/Light touch:          Left medial foot (L-4): normal          Left dorsal foot (L-5): normal          Left lateral foot (S-1): normal          Right medial foot (L-4): normal          Right dorsal foot (L-5): normal          Right lateral foot (S-1): normal       Sensory-Monofilament:          Left foot: normal          Right foot: normal       Inspection:           Left foot: normal          Right foot: normal       Nails:          Left foot: normal          Right foot: normal   Impression & Recommendations:  Problem # 1:  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 Will treat with KOMBIGLYZE + Glipizide.  Call if any problems.   Update labs in 4 wks. F/U in 3 mos. The following medications were removed from the medication list:    Victoza 18 Mg/67ml Soln (Liraglutide) .Marland Kitchen... 0.6 mg [place] injection qam x 1 wk then increase to 1.2 mg [place] injection daily    Metformin Hcl 500 Mg Tabs (Metformin hcl) .Marland Kitchen... 1 tab by mouth bid Her updated medication list for this problem includes:    Glucotrol 5 Mg Tabs (Glipizide) .Marland Kitchen... 1 tab by mouth two times a day with meals    Azor 5-40 Mg Tabs (Amlodipine-olmesartan) .Marland Kitchen... 1 tab by mouth daily    Aspir-low 81 Mg Tbec (Aspirin) .Marland Kitchen... 1 tab by mouth daily    Kombiglyze Xr 12-998 Mg Xr24h-tab (Saxagliptin-metformin) .Marland Kitchen... 1 tab by mouth daily    Lisinopril 10 Mg Tabs (Lisinopril) .Marland Kitchen... 1 tab by mouth daily  Labs Reviewed: Creat: 0.98 (02/14/2009)   Microalbumin: 150 (03/19/2010)  Last Eye Exam: no retinopathy (Dr Sherrie George) (02/01/2010) Reviewed HgBA1c results: 10.8 (03/19/2010)  8.2 (09/11/2009)  Problem # 2:  MICROALBUMINURIA (ICD-791.0) UMicro today. Add Lisinopril to ARB and recheck in 3 mos.  Problem # 3:  HYPERTENSION, BENIGN SYSTEMIC (ICD-401.1) BP goal is <130/80.  Add Lisinopril on board.  REcheck labs in 4 wks. Her updated medication list for this problem includes:    Azor 5-40 Mg Tabs (Amlodipine-olmesartan) .Marland Kitchen... 1 tab by mouth daily    Atenolol 50 Mg Tabs (Atenolol) .Marland Kitchen... Take 1 tab po once daily    Lisinopril 10 Mg Tabs (Lisinopril) .Marland Kitchen... 1 tab by mouth daily  BP today: 137/85 Prior BP: 137/93 (02/27/2010)  Prior 10 Yr Risk Heart Disease: 11 % (04/24/2008)  Labs Reviewed: K+: 4.3 (02/14/2009) Creat: : 0.98  (02/14/2009)   Chol: 195 (02/14/2009)   HDL:  46 (02/14/2009)   LDL: 114 (02/14/2009)   TG: 177 (02/14/2009)  Problem # 4:  ANXIETY STATE NOS (ICD-300.00) Worsened by the poor health of her mother.  Advised use of SSRI but she is refusing.  I will not increase her dose or frequency of xanax.  REcommended Hospice grief counseling for emotional support. Her updated medication list for this problem includes:    Alprazolam 0.5 Mg Tabs (Alprazolam) .Marland Kitchen... 1 tab by mouth two times a day as needed anxiety  Complete Medication List: 1)  Glucotrol 5 Mg Tabs (Glipizide) .Marland Kitchen.. 1 tab by mouth two times a day with meals 2)  Simvastatin 40 Mg Tabs (Simvastatin) .Marland Kitchen.. 1 tab by mouth qhs 3)  Azor 5-40 Mg Tabs (Amlodipine-olmesartan) .Marland Kitchen.. 1 tab by mouth daily 4)  Aspir-low 81 Mg Tbec (Aspirin) .Marland Kitchen.. 1 tab by mouth daily 5)  Atenolol 50 Mg Tabs (Atenolol) .... Take 1 tab po once daily 6)  Alprazolam 0.5 Mg Tabs (Alprazolam) .Marland Kitchen.. 1 tab by mouth two times a day as needed anxiety 7)  Kombiglyze Xr 12-998 Mg Xr24h-tab (Saxagliptin-metformin) .Marland Kitchen.. 1 tab by mouth daily 8)  Lisinopril 10 Mg Tabs (Lisinopril) .Marland Kitchen.. 1 tab by mouth daily  Other Orders: Fingerstick (81191) Hemoglobin A1C (47829) Urine Microalbumin (56213) T-Comprehensive Metabolic Panel (08657-84696) T-Lipid Profile (29528-41324)  Patient Instructions: 1)  Start on Kombiglyze once a day and stay on Glipizide twice a day for diabetes. 2)  AM fasting goal is 80-120. 3)  2 hrs after dinner goal is <150. 4)  Add Lisinopril for you other  BP meds for urine microalbumin (kidney protection). 5)  Update fasting labs in 4 wks.   6)  Talk to Hospice re: grief counseling. 7)  Return for follow up in 3 mos. Prescriptions: GLUCOTROL 5 MG TABS (GLIPIZIDE) 1 tab by mouth two times a day with meals  #60 x 3   Entered and Authorized by:   Seymour Bars DO   Signed by:   Seymour Bars DO on 03/19/2010   Method used:   Electronically to        [facility] [phone]*  (retail)       [address]       [place], [place]  [address]       Ph: [phone]       Fax: [phone]   RxID:   [id number] ALPRAZOLAM 0.5 MG TABS (ALPRAZOLAM) 1 tab by mouth two times a day as needed anxiety  #60 x 0   Entered and Authorized by:   Seymour Bars DO   Signed by:   Seymour Bars DO on 03/19/2010   Method used:   Printed then faxed to ...       [facility] [phone]* (retail)       [address]       [place], [place]  [address]       Ph: [phone]       Fax: [phone]   RxID:   [id number] LISINOPRIL 10 MG TABS (LISINOPRIL) 1 tab by mouth daily  #30 x 3   Entered and Authorized by:   Seymour Bars DO   Signed by:   Seymour Bars DO on 03/19/2010   Method used:   Electronically to        [facility] [phone]* (retail)       [address]       [place], [place]  [address]       Ph: [phone]       Fax: [phone]  RxID:   [id number] KOMBIGLYZE XR 12-998 MG XR24H-TAB (SAXAGLIPTIN-METFORMIN) 1 tab by mouth daily  #30 x 3   Entered and Authorized by:   Seymour Bars DO   Signed by:   Seymour Bars DO on 03/19/2010   Method used:   Printed then faxed to ...       [facility] [phone]* (retail)       [address]       [place], [place]  [address]       Ph: [phone]       Fax: [phone]   RxID:   [id number] KOMBIGLYZE XR 12-998 MG XR24H-TAB (SAXAGLIPTIN-METFORMIN) 1 tab by mouth daily  #14 x 0   Entered and Authorized by:   Seymour Bars DO   Signed by:   Seymour Bars DO on 03/19/2010   Method used:   Printed then faxed to ...       [facility] [phone]* (retail)       [address]       [place], [place]  [address]       Ph: [phone]       Fax: [phone]   RxID:   [id number]   Laboratory Results   Urine Tests    Microalbumin (urine): 150 mg/L Creatinine: 50mg /dL  A:C Ratio >517  Blood Tests     HGBA1C: 10.8%   (Normal Range: Non-Diabetic - 3-6%   Control Diabetic - 6-8%)

## 2010-09-07 NOTE — Assessment & Plan Note (Signed)
Summary: bronchitis f/u   Vital Signs:  Patient profile:   55 year old female Menstrual status:  hysterectomy Height:      65.75 inches Weight:      226 pounds BMI:     36.89 O2 Sat:      95 % on Room air Temp:     98.6 degrees F oral BP sitting:   114 / 82  (left arm) Cuff size:   large  Vitals Entered By: Payton Spark CMA (October 29, 2009 9:01 AM)  O2 Flow:  Room air CC: Cough, weak and HA   Primary Care Provider:  Seymour Bars D.O.  CC:  Cough and weak and HA.  History of Present Illness: 55  yo  WF presents for continued cough after treatement for bronchitis almost 57 wks ago with Zithromax and RX cough meds.  She continues to smoke about 2 cigs/ day.  C/O  fatigue, muscle aches, productive cough, constant HA x2 days. Earache as of this am. Clear nasal drainage. SOB with exertion. Facial congestion and chest tightness. She feels drainage down the back of her throat. She is able to go to work. Sleeps okay but has to get up  >3x during the night to use the restroom. She is not taking any OTC medications.  She started Victoza yesterday and experienced diarrhea last night and this am.                                                                        She has a small abrasion to the right lower abdomen at the waist line of her pants. The skin is open. It is painful and red. She first noticed it about a week ago after wearing "low rise jeans."   Current Medications (verified): 1)  Glucotrol 5 Mg Tabs (Glipizide) .Marland Kitchen.. 1 Tab By Mouth Two Times A Day With Meals 2)  Simvastatin 40 Mg Tabs (Simvastatin) .Marland Kitchen.. 1 Tab By Mouth Qhs 3)  Azor 5-40 Mg  Tabs (Amlodipine-Olmesartan) .Marland Kitchen.. 1 Tab By Mouth Daily 4)  Aspir-Low 81 Mg Tbec (Aspirin) .Marland Kitchen.. 1 Tab By Mouth Daily 5)  True Care Test Strips .... Use Twice Daily As Directed 6)  Atenolol 50 Mg Tabs (Atenolol) .... Take 1 Tab Po Once Daily 7)  Accu-Check Aviva .... Glucometer Use As Directed Dx 250.00 8)  Accu-Check Aviva Test Strips ....  Use Twice Daily As Directed 9)  Alprazolam 0.5 Mg Tabs (Alprazolam) .Marland Kitchen.. 1 Tab By Mouth Two Times A Day As Needed Anxiety 10)  Promethazine Vc/codeine 6.25-5-10 Mg/73ml Syrp (Phenyleph-Promethazine-Cod) .... 5 Ml By Mouth Q 6 Hrs As Needed Cough 11)  Victoza 18 Mg/47ml Soln (Liraglutide) .... 0.6 Mg [place] Injection Qam X 1 Wk Then Increase To 1.2 Mg [place] Injection Daily 12)  Pen Needles 31g X 6 Mm Misc (Insulin Pen Needle) .... Use Daily As Directed  Allergies (verified): 1)  ! Hydrochlorothiazide (Hydrochlorothiazide)  Past History:  Past Medical History: Hx of kidney stones DM HTN obesity anxiety smoker  Echo 5-09 with [facility]  Past Surgical History: Reviewed history from 02/13/2009 and no changes required. Complete hysterectomy with BSO  for dysplasia  kidney stones in 20s, 30s, s/sec lithotripsy  in [place] 08-2008  Family History: Reviewed history from 09/11/2009 and no changes required. BrCA-mother, Kidney Ca Aunt (shelby matthews) died of pancreatic cancer  Social History: Reviewed history from 09/11/2009 and no changes required. 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  3 adult children with prior spouse.  Quit smoking 2005, no etoh, no drugs, 2 caff/day, no reg exercise.  Review of Systems      See HPI       The patient complains of dyspnea on exertion, prolonged cough, and headaches.  The patient denies fever.    Physical Exam  General:  alert, cooperative to examination, obese  Head:  normocephalic and atraumatic. sinuses NTTP Eyes:  Conjunctiva  clear. Ears:  EACs patent; TMs translucent and gray with good cone of light and bony landmarks.  Nose:  no external deformity, no external erythema, no nasal discharge, and no mucosal edema.   Mouth:  pharyngeal erythema and postnasal drip.    Neck:  supple and full ROM.   Lungs:  Normal respiratory effort, chest expands symmetrically. Lungs are clear to auscultation,  no crackles or wheezes. dry cough Heart:  Normal rate and regular rhythm. S1 and S2 normal without gallop, murmur, click, rub or other extra sounds. Extremities:  no E/C/C Skin:  Right lower abdomen superficial abrasion,4cmx2cm, scant clear drainage. Border red. Surrounding skin pink and dry.  Psych:  Oriented X3, normally interactive, good eye contact, not anxious appearing, and not depressed appearing.     Impression & Recommendations:  Problem # 1:  ACUTE BRONCHITIS (ICD-466.0) Cough continues after about 4 wks and a round of Zithromax.  CXR today to r/o PNA/ Mass. Depo Medrol injection today.  PFs in the green zone so bronchodilator not added.   Take Delsym during the day and RX cough syrup at bedtime.  Stop smoking. Return in 3 weeks for F/U, call if symptoms worsen.  Orders: Depo- Medrol 40mg  (J1030) Admin of Therapeutic Inj  intramuscular or subcutaneous (16109)  The following medications were removed from the medication list:    Zithromax Z-pak 250 Mg Tabs (Azithromycin) .Marland Kitchen... Take as directed Her updated medication list for this problem includes:    Promethazine Vc/codeine 6.25-5-10 Mg/2ml Syrp (Phenyleph-promethazine-cod) .Marland KitchenMarland KitchenMarland KitchenMarland Kitchen 5 ml by mouth q 6 hrs as needed cough    Hydromet 5-1.5 Mg/46ml Syrp (Hydrocodone-homatropine) .Marland KitchenMarland KitchenMarland KitchenMarland Kitchen 5 ml by mouth at bedtime as needed cough  Problem # 2:  DIABETES MELLITUS II, UNCOMPLICATED (ICD-250.00) Stop Actos. (this was removed from her med list at previous visit)   Continue Victoza and Glipizide for DM. Continue am Fasting blood glucose checks. Call for lows, BS <70.  Her updated medication list for this problem includes:    Glucotrol 5 Mg Tabs (Glipizide) .Marland Kitchen... 1 tab by mouth two times a day with meals    Azor 5-40 Mg Tabs (Amlodipine-olmesartan) .Marland Kitchen... 1 tab by mouth daily    Aspir-low 81 Mg Tbec (Aspirin) .Marland Kitchen... 1 tab by mouth daily    Victoza 18 Mg/9ml Soln (Liraglutide) .Marland Kitchen... 0.6 mg [place] injection qam x 1 wk then increase to 1.2 mg [place]  injection daily  Problem # 3:  TRUNK ABRASION/FRICTION BURN WITHOUT MENTION INF (ICD-911.0) Abdomnial wound dressed today in office.  Keep abdominal wound clean with soap and water and cover with polysporin ointment and a bandage until healed.  Complete Medication List: 1)  Glucotrol 5 Mg Tabs (Glipizide) .Marland Kitchen.. 1 tab by mouth two times a day with meals 2)  Simvastatin 40 Mg Tabs (Simvastatin) .Marland KitchenMarland KitchenMarland Kitchen 1  tab by mouth qhs 3)  Azor 5-40 Mg Tabs (Amlodipine-olmesartan) .Marland Kitchen.. 1 tab by mouth daily 4)  Aspir-low 81 Mg Tbec (Aspirin) .Marland Kitchen.. 1 tab by mouth daily 5)  True Care Test Strips  .... Use twice daily as directed 6)  Atenolol 50 Mg Tabs (Atenolol) .... Take 1 tab po once daily 7)  Accu-check Aviva  .... Glucometer use as directed dx 250.00 8)  Accu-check Aviva Test Strips  .... Use twice daily as directed 9)  Alprazolam 0.5 Mg Tabs (Alprazolam) .Marland Kitchen.. 1 tab by mouth two times a day as needed anxiety 10)  Promethazine Vc/codeine 6.25-5-10 Mg/86ml Syrp (Phenyleph-promethazine-cod) .... 5 ml by mouth q 6 hrs as needed cough 11)  Victoza 18 Mg/9ml Soln (Liraglutide) .... 0.6 mg [place] injection qam x 1 wk then increase to 1.2 mg [place] injection daily 12)  Pen Needles 31g X 6 Mm Misc (Insulin pen needle) .... Use daily as directed 13)  Hydromet 5-1.5 Mg/37ml Syrp (Hydrocodone-homatropine) .... 5 ml by mouth at bedtime as needed cough  Other Orders: T-DG Chest 2 View [phone])  Patient Instructions: 1)  Depo Medrol injection today. 2)  CXR today.  Will call you w/ results this afternoon. 3)  Avoid smoking. 4)  Keep abdominal wound clean with soap and water. 5)  Cover with polysporin ointment and a bandage until healed. 6)  Use Delsym during the  day and RX cough syrup at night. 7)  STOP ACTOS. 8)  Stay on Victoza + Glipizide for diabetes. 9)  REturn for f/u in 3 wks. Prescriptions: HYDROMET 5-1.5 MG/5ML SYRP (HYDROCODONE-HOMATROPINE) 5 ml by mouth at bedtime as needed cough  #100 ml x 0   Entered and  Authorized by:   Seymour Bars DO   Signed by:   Seymour Bars DO on 10/29/2009   Method used:   Printed then faxed to ...       [facility]* (retail)       [address] [place], [place]  [address]       Ph: [phone]       Fax: [phone]   RxID:   [id number]    Medication Administration  Injection # 1:    Medication: Depo- Medrol 40mg     Diagnosis: ACUTE BRONCHITIS (ICD-466.0)    Route: IM    Site: RUOQ gluteus    Exp Date: 08/2011    Lot #: [id number]    Patient tolerated injection without complications    Given by: Payton Spark CMA (October 29, 2009 10:08 AM)  Orders Added: 1)  T-DG Chest 2 View [71020] 2)  Depo- Medrol 40mg  [J1030] 3)  Admin of Therapeutic Inj  intramuscular or subcutaneous [96372] 4)  Est. Patient Level IV [72536]

## 2010-09-07 NOTE — Progress Notes (Signed)
Summary: r/s visit  Phone Note Call from Patient   Caller: Patient Call For: Erin Bars DO Reason for Call: Referral Summary of Call: Patient rescheduled her appointment with you today until March 8th and she advised the [facility] office that she is not going to Dr Christell Constant because she has been there before and she does need to see a psychiatrist.  Please advise if you would like to discuss. Kellie Initial call taken by: Fabienne Bruns,  October 05, 2009 3:32 PM  Follow-up for Phone Call        Will discuss at her OV.   Follow-up by: Erin Bars DO,  October 06, 2009 3:02 PM

## 2010-09-07 NOTE — Progress Notes (Signed)
Summary: Anxiety  Phone Note Call from Patient   Caller: Patient Summary of Call: Pt states the med for anxiety is not working well. She is tearful and is having a hard time w/ emotions. She would like to go back on the alprazolam. Please advise.  Initial call taken by: Payton Spark CMA,  September 21, 2009 10:46 AM  Follow-up for Phone Call        can change her on 3-4.  Both are controlled medications. Follow-up by: Seymour Bars DO,  September 21, 2009 1:07 PM     Appended Document: Anxiety LMOM informing Pt of the above

## 2010-09-07 NOTE — Progress Notes (Signed)
Summary: Lost ABX  Phone Note Call from Patient   Caller: Patient Summary of Call: Pt states she lost her Zpack before complete. Pt states she has not had any improvement and requests replacement. Please advise.  Initial call taken by: Payton Spark CMA,  October 16, 2009 11:52 AM    Prescriptions: ZITHROMAX Z-PAK 250 MG TABS (AZITHROMYCIN) take as directed  #1 pack x 0   Entered and Authorized by:   Seymour Bars DO   Signed by:   Seymour Bars DO on 10/16/2009   Method used:   Electronically to        [facility]* (retail)       [address] [place], [place]  [address]       Ph: [phone]       Fax: [phone]   RxID:   [id number]   Appended Document: Lost ABX Pt aware

## 2010-09-07 NOTE — Medication Information (Signed)
Summary: Denial for Patient Assistance/[employer]  Denial for Patient Assistance/[employer]   Imported By: Lanelle Bal 04/08/2010 08:33:17  _____________________________________________________________________  External Attachment:    Type:   Image     Comment:   External Document

## 2010-09-07 NOTE — Progress Notes (Signed)
Summary: Xanax phone message   Phone Note Call from Patient   Caller: Patient Summary of Call: Pt LMOM that said, "This is [employer]. I need my xanax, I have been taking xanax for years and I need it. I can't believe y'all are doing this to me. I am going to the hospital." Initial call taken by: Payton Spark CMA,  September 30, 2009 3:59 PM  Follow-up for Phone Call        Has rx for tamezepam to last until 3/4 so will have to wait until then to change benzos.  Follow-up by: Nani Gasser MD,  September 30, 2009 4:07 PM     Appended Document: Xanax phone message  Pt called Diane stating that she threw away the temezepam bc it was making her "sick". Pt was crying and saying that she has not been off of xanax in 5 years and now she is having bad anxiety attacks and can not wait until 10/09/09. Please advise.   Appended Document: Xanax phone message  I will fill Xanax but I am going to require her to see psych for med managment after this.  Seymour Bars, D.O.  Appended Document: Xanax phone message  Prescriptions: ALPRAZOLAM 0.5 MG TABS (ALPRAZOLAM) 1 tab by mouth two times a day as needed anxiety  #60 x 0   Entered and Authorized by:   Seymour Bars DO   Signed by:   Seymour Bars DO on 10/01/2009   Method used:   Printed then faxed to ...       [facility]* (retail)       [address] [place], [place]  [address]       Ph: [phone]       Fax: [phone]   RxID:   [id number]   Appended Document: Xanax phone message  Pt states that she has tried psych before and they were not able to make any changes. Pt states she prefers to stay on xanax from now on. Please advise.   Appended Document: Xanax phone message  Kellie: Pls address.  This pt has requested benzos early and has refussed psych referral.  Pls explain to pt that this is not an option .  Seymour Bars, D.O.

## 2010-09-07 NOTE — Assessment & Plan Note (Signed)
Summary: HYPERGLYCEMIA/TM   Vital Signs:  Patient Profile:   55 Years Old Female CC:      Hyperglycemia Height:     65.75 inches Weight:      250 pounds O2 Sat:      97 % O2 treatment:    Room Air Temp:     98.3 degrees F oral Pulse rate:   88 / minute Resp:     20 per minute BP sitting:   137 / 93  (right arm) Cuff size:   large  Vitals Entered By: Lajean Saver RN (February 27, 2010 11:25 AM)             Is Patient Diabetic? Yes  CBG Result 436     CBG #1 436 Time 1035 CBG #2 329 Time 1145 CBG #3 298 Time 1247      Updated Prior Medication List: GLUCOTROL 5 MG TABS (GLIPIZIDE) 1 tab by mouth two times a day with meals SIMVASTATIN 40 MG TABS (SIMVASTATIN) 1 tab by mouth qhs AZOR 5-40 MG  TABS (AMLODIPINE-OLMESARTAN) 1 tab by mouth daily ASPIR-LOW 81 MG TBEC (ASPIRIN) 1 tab by mouth daily * TRUE CARE TEST STRIPS use twice daily as directed ATENOLOL 50 MG TABS (ATENOLOL) Take 1 tab po once daily * ACCU-CHECK AVIVA glucometer use as directed Dx 250.00 * ACCU-CHECK AVIVA TEST STRIPS use twice daily as directed ALPRAZOLAM 0.5 MG TABS (ALPRAZOLAM) 1 tab by mouth two times a day as needed anxiety PROMETHAZINE VC/CODEINE 6.25-5-10 MG/5ML SYRP (PHENYLEPH-PROMETHAZINE-COD) 5 ml by mouth q 6 hrs as needed cough VICTOZA 18 MG/3ML SOLN (LIRAGLUTIDE) 0.6 mg [place] injection qAM x 1 wk then increase to 1.2 mg [place] injection daily PEN NEEDLES 31G X 6 MM MISC (INSULIN PEN NEEDLE) use daily as directed HYDROMET 5-1.5 MG/5ML SYRP (HYDROCODONE-HOMATROPINE) 5 ml by mouth at bedtime as needed cough LEVAQUIN 750 MG TABS (LEVOFLOXACIN) 1 tab by mouth daily x 5 days METFORMIN HCL 500 MG TABS (METFORMIN HCL) 1 tab by mouth BID  Current Allergies (reviewed today): ! HYDROCHLOROTHIAZIDE (HYDROCHLOROTHIAZIDE)History of Present Illness History from: patient Chief Complaint: Hyperglycemia History of Present Illness: 55yo WF with DM2 comes to clinic today with BS>400 at home this morning.  Hasn't  been seeing her physician very often or following instructions.  Poor diet, not taking medications regularly.  She has a lot of stress at home with her family.  No other illness recently.  Today she feels nauseated with mild abd pain, but otherwise ok.  No fever, CP, SOB, sweating, numbness, tingling, confusion, dizziness.  She is still taking her Glipizide.  Her BS at home has averaged in the mid-200's.  She doesn't have a f/u appt with her PCP scheduled.  She used to take Metformin but doesn't take it anymore and needs a refill.  REVIEW OF SYSTEMS Constitutional Symptoms       Complains of fatigue.     Denies fever, chills, night sweats, weight loss, and weight gain.      Comments: diaphoresis Eyes       Denies change in vision, eye pain, eye discharge, glasses, contact lenses, and eye surgery. Ear/Nose/Throat/Mouth       Complains of dizziness.      Denies hearing loss/aids, change in hearing, ear pain, ear discharge, frequent runny nose, frequent nose bleeds, sinus problems, sore throat, hoarseness, and tooth pain or bleeding.  Respiratory       Denies dry cough, productive cough, wheezing, shortness of breath, asthma, bronchitis, and emphysema/COPD.  Cardiovascular       Denies murmurs, chest pain, and tires easily with exhertion.    Gastrointestinal       Complains of nausea/vomiting.      Denies stomach pain, diarrhea, constipation, blood in bowel movements, and indigestion.      Comments: no vomiting Genitourniary       Denies painful urination, kidney stones, and loss of urinary control. Neurological       Denies paralysis, seizures, and fainting/blackouts. Musculoskeletal       Denies muscle pain, joint pain, joint stiffness, decreased range of motion, redness, swelling, muscle weakness, and gout.  Skin       Denies bruising, unusual mles/lumps or sores, and hair/skin or nail changes.  Psych       Denies mood changes, temper/anger issues, anxiety/stress, speech problems,  depression, and sleep problems. Other Comments: Patient is a type II diabetic. She took her glipizide this AM and then checked her CBG which was 401. Her current CBG is 436.  9 units of insulin given and IV NS started.    Past History:  Past Medical History: Hx of kidney stones DM HTN obesity anxiety  Echo 5-09 with [facility]  Past Surgical History: Reviewed history from 02/13/2009 and no changes required. Complete hysterectomy with BSO  for dysplasia  kidney stones in 20s, 30s, s/sec lithotripsy  in [place] 08-2008  Family History: Reviewed history from 09/11/2009 and no changes required. BrCA-mother, Kidney Ca Aunt (shelby matthews) died of pancreatic cancer  Social History: Reviewed history from 09/11/2009 and no changes required. 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   3 adult children with prior spouse.  Quit smoking 2005, no etoh, no drugs,  2 caff/day, no reg exercise. Physical Exam General appearance: well developed, well nourished, no acute distress.  Obese. Head: normocephalic, atraumatic Eyes: conjunctivae and lids normal Pupils: equal, round, reactive to light Ears: normal, no lesions or deformities Nasal: mucosa pink, nonedematous, no septal deviation, turbinates normal Oral/Pharynx: tongue normal, posterior pharynx without erythema or exudate Neck: neck supple,  trachea midline, no masses Thyroid: no nodules, masses, tenderness, or enlargement Chest/Lungs: no rales, wheezes, or rhonchi bilateral, breath sounds equal without effort Heart: regular rate and  rhythm, no murmur Abdomen: soft, non-tender without obvious organomegaly Extremities: normal extremities Neurological: grossly intact and non-focal Skin: no obvious rashes or lesions MSE: oriented to time, place, and person Assessment New Problems: DIAB W/O MENTION COMP TYPE II/UNS TYPE UNCNTRL (ICD-250.02)  Initial BS is 436 After 500cc NS given  and 9Units of Insulin, BS to 329. After another 500cc [place], BS is now 298.  Slight nausea treated with 4mg  IM Zofran. Pt tolerating P.O water 1:20pm Patient is now feeling much better, no nausea, no other symptoms and ready to go home  Plan New Medications/Changes: METFORMIN HCL 500 MG TABS (METFORMIN HCL) 1 tab by mouth BID  #60 x 0, 02/27/2010, Hoyt Koch MD  New Orders: New Patient Level V [99205] T-Comprehensive Metabolic Panel [80053-22900] T-CBC w/Diff [85025-10010] T- Hemoglobin A1C [83036-23375] T-TSH [16109-60454] UA Dipstick w/o Micro (automated)  [81003] Capillary Blood Glucose/CBG [82948] Insulin 5 units [J1815] IV Fluids 1st hr Non Medicare [96360] 0.9% NS [EMRZERO] Admin of Therapeutic Inj  intramuscular or subcutaneous [96372] Fingerstick [36416] 12 Lead EKG [12 Lead EKG] EKG w/ Interpretation [93000] Zofran 4mg  Tab [EMRORAL] Zofran 1mg . injection [J2405] Planning Comments:   To discharge patient home in stable and improved condition  Follow Up: Follow  up with Primary Physician Follow Up: this week with your PCP  The patient and/or caregiver has been counseled thoroughly with regard to medications prescribed including dosage, schedule, interactions, rationale for use, and possible side effects and they verbalize understanding.  Diagnoses and expected course of recovery discussed and will return if not improved as expected or if the condition worsens. Patient and/or caregiver verbalized understanding.  Prescriptions: METFORMIN HCL 500 MG TABS (METFORMIN HCL) 1 tab by mouth BID  #60 x 0   Entered and Authorized by:   Hoyt Koch MD   Signed by:   Hoyt Koch MD on 02/27/2010   Method used:   Printed then faxed to ...       [facility] [phone]* (retail)       [address]       [place], [place]  [address]       Ph: [phone]       Fax: [phone]   RxID:   [id number]   Patient Instructions: 1)  Monitor your blood sugar a  few times a day for the next few days 2)  You must schedule a f/u appt with your PCP on Monday or Tuesday to discuss labs and your diabetes treatment 3)  If blood sugar goes above 400 again, or if further severe symptoms, go to the ER 4)  Take all your medicines as directed 5)  You may need to start insulin but I'd like you to discuss that with your PCP 6)  Be sure to hydrate more 7)  Healthy ADA diet discussed 8)  Patient was counseled for 30 mins about diabetes, diet, and warning signs on when to seek medical attention  Medication Administration  Injection # 1:    Medication: Novolin 100u/ml    Diagnosis: DIAB W/O MENTION COMP TYPE II/UNS TYPE UNCNTRL (ICD-250.02)    Route: SQ    Site: posterior right arm    Exp Date: 05/07/2010    Lot #: [id number]    Mfr: Novo Nordisk    Comments: 9units given for CBG of 436    Patient tolerated injection without complications    Given by: Lajean Saver RN (February 27, 2010 11:31 AM)  Injection # 2:    Medication: Zofran 1mg . injection    Diagnosis: DIAB W/O MENTION COMP TYPE II/UNS TYPE UNCNTRL (ICD-250.02)    Route: IM    Site: R deltoid    Exp Date: 07/08/2011    Lot #: [id number]    Mfr: [place]     Comments: 4mg  given    Patient tolerated injection without complications    Given by: Lajean Saver RN (February 27, 2010 1:04 PM)  Infusion # 1:    Diagnosis: DIAB W/O MENTION COMP TYPE II/UNS TYPE UNCNTRL (ICD-250.02)    Started: 1100    Stopped: 1300    Solution: 0.9% NS    Instructions: Infuse over 2 hours    Route: IV infusion    Site: right wrist    Ordered by: Dr. Sheppard Coil    Administered by: Lajean Saver RN-February 27, 2010 11:30 AM    Patient tolerated infusion without complications  Orders Added: 1)  New Patient Level V [99205] 2)  T-Comprehensive Metabolic Panel [80053-22900] 3)  T-CBC w/Diff [63875-64332] 4)  T- Hemoglobin A1C [83036-23375] 5)  T-TSH [95188-41660] 6)  UA Dipstick w/o Micro (automated)  [81003] 7)   Capillary Blood Glucose/CBG [82948] 8)  Insulin 5 units [J1815] 9)  IV Fluids 1st hr Non Medicare [96360] 10)  0.9% NS [EMRZERO] 11)  Admin of Therapeutic Inj  intramuscular or subcutaneous [96372] 12)  Fingerstick [36416] 13)  12 Lead EKG [12 Lead EKG] 14)  EKG w/ Interpretation [93000] 15)  Zofran 4mg  Tab [EMRORAL] 16)  Zofran 1mg . injection [J2405]  Laboratory Results   Urine Tests  Date/Time Received: February 27, 2010 11:20 AM  Date/Time Reported: February 27, 2010 11:20 AM   Routine Urinalysis   Color: yellow Appearance: slightly cloudy Glucose: >=1000   (Normal Range: Negative) Bilirubin: negative   (Normal Range: Negative) Ketone: negative   (Normal Range: Negative) Spec. Gravity: 1.010   (Normal Range: 1.003-1.035) Blood: negative   (Normal Range: Negative) pH: 5.0   (Normal Range: 5.0-8.0) Protein: 30   (Normal Range: Negative) Urobilinogen: 0.2   (Normal Range: 0-1) Nitrite: negative   (Normal Range: Negative) Leukocyte Esterace: negative   (Normal Range: Negative)     Blood Tests     CBG Random:: 436mg /dL

## 2010-09-07 NOTE — Progress Notes (Signed)
Summary: Insulin  Phone Note Call from Patient   Caller: Patient Summary of Call: Pt states she has decided since last OV that she would rather take insulin daily if she can dc some of the other oral DM meds. Pt states she had a lot going on at OV and wasn't "thinking right". Is this something Pt can start?   Initial call taken by: Payton Spark CMA,  March 22, 2010 10:45 AM  Follow-up for Phone Call        OK to stop Kombiglyze.  Stay on Glipizide twice a day and ADD LEVEMIR 20 units [place] injection every morning.  Check sugars AM fasting AND 2 hrs after dinner. Call if any readings are <70 or >300.  Call with home sugar readings in 1 wk. Return for f/u with sugar readings in 3-4 wks.   Follow-up by: Seymour Bars DO,  March 22, 2010 10:52 AM    New/Updated Medications: LEVEMIR FLEXPEN 100 UNIT/ML SOLN (INSULIN DETEMIR) 20 units [place] injection every morning PEN NEEDLES 1/2" 29G X MISC (INSULIN PEN NEEDLE) use daily as directed Prescriptions: PEN NEEDLES 1/2" 29G X MISC (INSULIN PEN NEEDLE) use daily as directed  #100 x 3   Entered and Authorized by:   Seymour Bars DO   Signed by:   Seymour Bars DO on 03/22/2010   Method used:   Electronically to        [facility] [phone]* (retail)       [address]       [place], [place]  [address]       Ph: [phone]       Fax: [phone]   RxID:   [id number] LEVEMIR FLEXPEN 100 UNIT/ML SOLN (INSULIN DETEMIR) 20 units [place] injection every morning  #1 box x 2   Entered and Authorized by:   Seymour Bars DO   Signed by:   Seymour Bars DO on 03/22/2010   Method used:   Electronically to        [facility] [phone]* (retail)       [address]       [place], [place]  [address]       Ph: [phone]       Fax: [phone]   RxID:   [id number]   Appended Document: Insulin Pt aware of the above

## 2010-09-07 NOTE — Assessment & Plan Note (Signed)
Summary: f/u U/S   Vital Signs:  Patient profile:   55 year old female Menstrual status:  hysterectomy Height:      65.75 inches Weight:      232 pounds BMI:     37.87 O2 Sat:      94 % on Room air Pulse rate:   76 / minute BP sitting:   126 / 79  (left arm) Cuff size:   large  Vitals Entered By: Payton Spark CMA (June 15, 2010 11:59 AM)  O2 Flow:  Room air CC: F/U US.    Primary Care Provider:  Seymour Bars D.O.  CC:  F/U US. .  History of Present Illness: 55 yo WF presents for f/u u/s abdomen done today.  She has been having epigastric pain on and off for 2-3 wks.  She denies blood in her stool or constipation.  Slight nausea, no vomitting.  Her appetite is OK.  it is a little worse after eating.  She has used Tums or Prevacid which does not help.  The pain is stabbing and goes to her L flank.  No fevers but has chills.  No cough.  No nightsweats or wt loss.  Her sugars have been running 'high' because she ran out of Levemir samples and cannot afford the RX.    Current Medications (verified): 1)  Glucotrol 5 Mg Tabs (Glipizide) .Marland Kitchen.. 1 Tab By Mouth Two Times A Day With Meals 2)  Simvastatin 40 Mg Tabs (Simvastatin) .Marland Kitchen.. 1 Tab By Mouth Qhs 3)  Azor 5-40 Mg Tabs (Amlodipine-Olmesartan) .Marland Kitchen.. 1 Tab By Mouth Daily 4)  Aspir-Low 81 Mg Tbec (Aspirin) .Marland Kitchen.. 1 Tab By Mouth Daily 5)  Atenolol 50 Mg Tabs (Atenolol) .... Take 1 Tab Po Once Daily 6)  Alprazolam 0.5 Mg Tabs (Alprazolam) .Marland Kitchen.. 1 Tab By Mouth Two Times A Day As Needed Anxiety 7)  Levemir Flexpen 100 Unit/ml Soln (Insulin Detemir) .... 25 Units [place] Injection Two Times A Day 8)  Pen Needles 1/2" 29g X 12mm Misc (Insulin Pen Needle) .... Use Daily As Directed  Allergies (verified): 1)  ! Hydrochlorothiazide (Hydrochlorothiazide) 2)  ! * Victoza 3)  ! * Metformin  Past History:  Past Medical History: Hx of kidney stones DM HTN obesity anxiety fatty liver dz  Echo 5-09 with [facility]  Past  Surgical History: Reviewed history from 02/13/2009 and no changes required. Complete hysterectomy with BSO  for dysplasia  kidney stones in 20s, 30s, s/sec lithotripsy  in [place] 08-2008  Family History: Reviewed history from 09/11/2009 and no changes required. BrCA-mother, Kidney Ca Aunt (shelby matthews) died of pancreatic cancer  Social History: Reviewed history from 02/27/2010 and no changes required. 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 Separated from [place].   3 adult children with prior spouse.  Quit smoking 2005, no etoh, no drugs,  2 caff/day, no reg exercise.  Review of Systems General:  Complains of fatigue; denies fever, loss of appetite, and weight loss. CV:  Denies chest pain or discomfort and shortness of breath with exertion. GI:  Complains of abdominal pain, indigestion, and nausea; denies bloody stools, change in bowel habits, constipation, diarrhea, loss of appetite, and vomiting. GU:  Denies dysuria.  Physical Exam  General:  alert, well-developed, well-nourished, and well-hydrated.  obese Head:  normocephalic and atraumatic.   Eyes:  sclera non icteric Mouth:  pharynx pink and moist.   Neck:  no masses.   Lungs:  Normal respiratory effort, chest expands  symmetrically. Lungs are clear to auscultation, no crackles or wheezes. dry cough Heart:  Normal rate and regular rhythm. S1 and S2 normal without gallop, murmur, click, rub or other extra sounds. Abdomen:  Bowel sounds positive,abdomen soft and non-tender without masses, organomegaly  Extremities:  no E/C/C Skin:  color normal.   Psych:  good eye contact, not anxious appearing, and not depressed appearing.     Impression & Recommendations:  Problem # 1:  FATTY LIVER DISEASE (ICD-571.8) Hepatic steatosis on u/s today with hepatomegaly secondary to her obesity -- BMI 37 c/w class II obesity. We discussed treatment plan: weight loss.  Plan to get albumin, protime in the next 4 mos for  f/u and avoid any hepatotoxic medications.  Problem # 2:  DIABETES MELLITUS II, UNCOMPLICATED (ICD-250.00) Poor control due to financial problems paying for her Levemir so she stopped it.  I changed her Levemir to Lantus SoloStar pen once daily - 35 units once daily (from 20 units two times a day on Levemir).  She needs to stay on diabetic diet.  Sample pens (2) given today. RTC with sugar readings in 2 wks.  Her updated medication list for this problem includes:    Glucotrol 5 Mg Tabs (Glipizide) .Marland Kitchen... 1 tab by mouth two times a day with meals    Azor 5-40 Mg Tabs (Amlodipine-olmesartan) .Marland Kitchen... 1 tab by mouth daily    Aspir-low 81 Mg Tbec (Aspirin) .Marland Kitchen... 1 tab by mouth daily    Lantus Solostar 100 Unit/ml Soln (Insulin glargine) .Marland KitchenMarland KitchenMarland KitchenMarland Kitchen 35 units [place] injection qam  Labs Reviewed: Creat: 0.93 (04/23/2010)   Microalbumin: 150 (03/19/2010)  Last Eye Exam: no retinopathy (Dr Sherrie George) (02/01/2010) Reviewed HgBA1c results: 10.8 (03/19/2010)  8.2 (09/11/2009)  Problem # 3:  ABDOMINAL PAIN, EPIGASTRIC (ICD-789.06) Intermittent pain with normal exam findings today.  Had fatty liver and a questionable spot on her pancreas on u/s this AM.  Will need CT to further evaluate her pancreas esp given her strong fam hx of pancreatic cancer and her diabetes.    Complete Medication List: 1)  Glucotrol 5 Mg Tabs (Glipizide) .Marland Kitchen.. 1 tab by mouth two times a day with meals 2)  Simvastatin 40 Mg Tabs (Simvastatin) .Marland Kitchen.. 1 tab by mouth qhs 3)  Azor 5-40 Mg Tabs (Amlodipine-olmesartan) .Marland Kitchen.. 1 tab by mouth daily 4)  Aspir-low 81 Mg Tbec (Aspirin) .Marland Kitchen.. 1 tab by mouth daily 5)  Atenolol 50 Mg Tabs (Atenolol) .... Take 1 tab po once daily 6)  Alprazolam 0.5 Mg Tabs (Alprazolam) .Marland Kitchen.. 1 tab by mouth two times a day as needed anxiety 7)  Lantus Solostar 100 Unit/ml Soln (Insulin glargine) .... 35 units [place] injection qam 8)  Pen Needles 1/2" 29g X 12mm Misc (Insulin pen needle) .... Use daily as directed  Patient  Instructions: 1)  Will get your CT ordered to be done downstairs tomorrow and I will call you w/ results. 2)  Will change Levemir to Lantus Solostar 35 units injection once daily (in the AM). 3)  REturn for follow up in 2 wks.   Orders Added: 1)  Est. Patient Level IV [[address]]

## 2010-09-07 NOTE — Progress Notes (Signed)
  Phone Note Refill Request Message from:  Fax from Pharmacy on February 11, 2010 4:48 PM  Refills Requested: Medication #1:  GLUCOTROL 5 MG TABS 1 tab by mouth two times a day with meals   Dosage confirmed as above?Dosage Confirmed   Brand Name Necessary? No   Supply Requested: 1 month   Last Refilled: 10/19/2009   Notes: generic for glipizide  Medication #2:  AZOR 5-40 MG  TABS 1 tab by mouth daily   Dosage confirmed as above?Dosage Confirmed   Brand Name Necessary? No   Supply Requested: 1 month   Last Refilled: 11/08/2009 Initial call taken by: Fabienne Bruns,  February 11, 2010 4:50 PM    Prescriptions: AZOR 5-40 MG  TABS (AMLODIPINE-OLMESARTAN) 1 tab by mouth daily  #30.0 Each x 0   Entered by:   Payton Spark CMA   Authorized by:   Seymour Bars DO   Signed by:   Payton Spark CMA on 02/11/2010   Method used:   Electronically to        [facility]. [phone]* (retail)       [address]       [place], [place]  [address]       Ph: [phone]       Fax: [phone]   RxID:   [id number] GLUCOTROL 5 MG TABS (GLIPIZIDE) 1 tab by mouth two times a day with meals  #60.0 Each x 0   Entered by:   Payton Spark CMA   Authorized by:   Seymour Bars DO   Signed by:   Payton Spark CMA on 02/11/2010   Method used:   Electronically to        [facility]. [phone]* (retail)       [address]       [place], [place]  [address]       Ph: [phone]       Fax: [phone]   RxID:   [id number]   Appended Document:

## 2010-09-07 NOTE — Assessment & Plan Note (Signed)
Summary: bronchitis/ anxiety   Vital Signs:  Patient profile:   55 year old female Menstrual status:  hysterectomy Height:      65.75 inches Weight:      231 pounds BMI:     37.70 O2 Sat:      97 % on Room air Temp:     98.5 degrees F oral Pulse rate:   82 / minute BP sitting:   132 / 82  (left arm) Cuff size:   large  Vitals Entered By: Payton Spark CMA (October 13, 2009 11:03 AM)  O2 Flow:  Room air CC: F/U. Discuss meds.    Primary Care Provider:  Seymour Bars D.O.  CC:  F/U. Discuss meds. .  History of Present Illness: 55 yo WF presents for 2 days of sore thoat and cough.  No fevers or chills.  She has some nasal congestion and production w/ her cough.  No SOB or CP.  She is not a smoker.  She is not taking anything OTC.  Her cough is keeping her up at night.  She is doing well back on Alprazolam which she claims to only be taking once a day, usually at night for anxiety.  Things are going better at home.  Her husband came back and they are working things out.  She has yet to get back to exercise and has only cut back on portion sizes but is eating out a lot and gaining more wt.  She is getting fasting sugars in the 160s on Glipizide two times a day but is not taking her actos.  She declined referral to counselor for mood issues.  She tried Zoloft and Effexor, neither of which did her 'much good'.  She would like to focus on improving her sugars and her weight.  She feels emotionally stable now.    Current Medications (verified): 1)  Glucotrol 5 Mg Tabs (Glipizide) .Marland Kitchen.. 1 Tab By Mouth Two Times A Day With Meals 2)  Simvastatin 40 Mg Tabs (Simvastatin) .Marland Kitchen.. 1 Tab By Mouth Qhs 3)  Azor 5-40 Mg  Tabs (Amlodipine-Olmesartan) .Marland Kitchen.. 1 Tab By Mouth Daily 4)  Aspir-Low 81 Mg Tbec (Aspirin) .Marland Kitchen.. 1 Tab By Mouth Daily 5)  True Care Test Strips .... Use Twice Daily As Directed 6)  Atenolol 50 Mg Tabs (Atenolol) .... Take 1 Tab Po Once Daily 7)  Accu-Check Aviva .... Glucometer Use As  Directed Dx 250.00 8)  Accu-Check Aviva Test Strips .... Use Twice Daily As Directed 9)  Actos 30 Mg Tabs (Pioglitazone Hcl) .Marland Kitchen.. 1 Tab By Mouth Once Daily 10)  Alprazolam 0.5 Mg Tabs (Alprazolam) .Marland Kitchen.. 1 Tab By Mouth Two Times A Day As Needed Anxiety  Allergies (verified): 1)  ! Hydrochlorothiazide (Hydrochlorothiazide)  Past History:  Past Medical History: Reviewed history from 12/15/2008 and no changes required. Hx of kidney stones DM HTN obesity anxiety  Echo 5-09 with [facility]  Past Surgical History: Reviewed history from 02/13/2009 and no changes required. Complete hysterectomy with BSO  for dysplasia  kidney stones in 20s, 30s, s/sec lithotripsy  in [place] 08-2008  Social History: Reviewed history from 09/11/2009 and no changes required. 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  3 adult children with prior spouse.  Quit smoking 2005, no etoh, no drugs, 2 caff/day, no reg exercise.  Review of Systems      See HPI  Physical Exam  General:  alert, well-developed, well-nourished, and well-hydrated.  obese Head:  normocephalic and atraumatic.   Eyes:  conjunctiva clear, wears glasses Ears:  EACs patent; TMs translucent and gray with good cone of light and bony landmarks.  Nose:  nasal congestion present Mouth:  o/p injected Neck:  no masses.   Lungs:  normal respiratory effort, no intercostal retractions, no accessory muscle use, and normal breath sounds.  rhonchi with cough Heart:  Normal rate and regular rhythm. S1 and S2 normal without gallop, murmur, click, rub or other extra sounds. Extremities:  no LE edema no cyanosis Skin:  color normal.   Cervical Nodes:  No lymphadenopathy noted Psych:  good eye contact, not anxious appearing, and not depressed appearing.     Impression & Recommendations:  Problem # 1:  ACUTE BRONCHITIS (ICD-466.0) Treat bronchial cough with Zithromax and RX cough meds. Call if not  improved in a wk. Her updated medication list for this problem includes:    Zithromax Z-pak 250 Mg Tabs (Azithromycin) .Marland Kitchen... Take as directed    Promethazine Vc/codeine 6.25-5-10 Mg/25ml Syrp (Phenyleph-promethazine-cod) .Marland KitchenMarland KitchenMarland KitchenMarland Kitchen 5 ml by mouth q 6 hrs as needed cough  Problem # 2:  ANXIETY STATE NOS (ICD-300.00) Doing better.  Home life more stable.  Agrees to take her Xanax just once daily. Her updated medication list for this problem includes:    Alprazolam 0.5 Mg Tabs (Alprazolam) .Marland Kitchen... 1 tab by mouth two times a day as needed anxiety  Problem # 3:  OBESITY NOS (ICD-278.00) BMI 37 complicated by T2DM, HTN, high cholesterol, depression, back pain. 3 # wt gain from last visit. Will start her on Victoza for DM and also wt loss. Cautioned about slowed gastric emptying and nausea SEs. F/U in 3 wks.  Problem # 4:  DIABETES MELLITUS II, UNCOMPLICATED (ICD-250.00) AM fastings in the 160s, not at goal with further wt gain. She is to stay on Glipizide 5 mg two times a day and will add Victoza pen 0.6 mg [place] injection daily x 1 wk then go up to 1.2 mg/ day. Check AM fastings.  Call if any lows (<70).  Work on diet, exercise.   Has f/u with me in 3 wks. The following medications were removed from the medication list:    Actos 30 Mg Tabs (Pioglitazone hcl) .Marland Kitchen... 1 tab by mouth once daily Her updated medication list for this problem includes:    Glucotrol 5 Mg Tabs (Glipizide) .Marland Kitchen... 1 tab by mouth two times a day with meals    Azor 5-40 Mg Tabs (Amlodipine-olmesartan) .Marland Kitchen... 1 tab by mouth daily    Aspir-low 81 Mg Tbec (Aspirin) .Marland Kitchen... 1 tab by mouth daily    Victoza 18 Mg/71ml Soln (Liraglutide) .Marland Kitchen... 0.6 mg [place] injection qam x 1 wk then increase to 1.2 mg  injection daily  Complete Medication List: 1)  Glucotrol 5 Mg Tabs (Glipizide) .Marland Kitchen.. 1 tab by mouth two times a day with meals 2)  Simvastatin 40 Mg Tabs (Simvastatin) .Marland Kitchen.. 1 tab by mouth qhs 3)  Azor 5-40 Mg Tabs (Amlodipine-olmesartan) .Marland Kitchen.. 1 tab  by mouth daily 4)  Aspir-low 81 Mg Tbec (Aspirin) .Marland Kitchen.. 1 tab by mouth daily 5)  True Care Test Strips  .... Use twice daily as directed 6)  Atenolol 50 Mg Tabs (Atenolol) .... Take 1 tab po once daily 7)  Accu-check Aviva  .... Glucometer use as directed dx 250.00 8)  Accu-check Aviva Test Strips  .... Use twice daily as directed 9)  Alprazolam 0.5 Mg Tabs (Alprazolam) .Marland Kitchen.. 1 tab by mouth two times a  day as needed anxiety 10)  Zithromax Z-pak 250 Mg Tabs (Azithromycin) .... Take as directed 11)  Promethazine Vc/codeine 6.25-5-10 Mg/58ml Syrp (Phenyleph-promethazine-cod) .... 5 ml by mouth q 6 hrs as needed cough 12)  Victoza 18 Mg/25ml Soln (Liraglutide) .... 0.6 mg [place] injection qam x 1 wk then increase to 1.2 mg [place] injection daily 13)  Pen Needles 31g X 6 Mm Misc (Insulin pen needle) .... Use daily as directed  Patient Instructions: 1)  Take Zithromax x 5 days for bronchitis. 2)  Use RX cough syrup as needed -- it will make you sleepy. 3)  In 10 days, start on VICTOZA injection once daily (with breakfast).  Stay on your Glipizide. 4)  Check AM fastings 90-120 is goal. 5)  Call if you get any lows (<70). 6)  Work on [employer] and regular exercise. 7)  Call if you want Marcelino Duster to go over injection technique with you. 8)  REturn for f/u with glucose log in 1 month. Prescriptions: SIMVASTATIN 40 MG TABS (SIMVASTATIN) 1 tab by mouth qhs  #30 x 3   Entered and Authorized by:   Seymour Bars DO   Signed by:   Seymour Bars DO on 10/13/2009   Method used:   Electronically to        [facility]. [phone]* (retail)       [address]       [place], [place]  [address]       Ph: [phone]       Fax: [phone]   RxID:   [id number] PEN NEEDLES 31G X 6 MM MISC (INSULIN PEN NEEDLE) use daily as directed  #100 x 1   Entered and Authorized by:   Seymour Bars DO   Signed by:   Seymour Bars DO on 10/13/2009   Method used:   Electronically to        [facility]. [phone]*  (retail)       [address]       [place], [place]  [address]       Ph: [phone]       Fax: [phone]   RxID:   [id number] ZSWFUXN 18 MG/3ML SOLN (LIRAGLUTIDE) 0.6 mg [place] injection qAM x 1 wk then increase to 1.2 mg [place] injection daily  #1 box x 0   Entered and Authorized by:   Seymour Bars DO   Signed by:   Seymour Bars DO on 10/13/2009   Method used:   Electronically to        [facility]. [phone]* (retail)       [address]       [place], [place]  [address]       Ph: [phone]       Fax: [phone]   RxID:   [id number] PROMETHAZINE VC/CODEINE 6.25-5-10 MG/5ML SYRP (PHENYLEPH-PROMETHAZINE-COD) 5 ml by mouth q 6 hrs as needed cough  #200 ml x 0   Entered and Authorized by:   Seymour Bars DO   Signed by:   Seymour Bars DO on 10/13/2009   Method used:   Printed then faxed to ...       [facility] [address] [phone]* (retail)       [address]       [place], [place]  [address]       Ph: [phone]       Fax: [phone]   RxID:   [id number] ZITHROMAX Z-PAK 250 MG TABS (AZITHROMYCIN) take as directed  #  1 pack x 0   Entered and Authorized by:   Seymour Bars DO   Signed by:   Seymour Bars DO on 10/13/2009   Method used:   Electronically to        [facility]. [phone]* (retail)       [address]       [place], [place]  [address]       Ph: [phone]       Fax: [phone]   RxID:   [id number]

## 2010-09-07 NOTE — Progress Notes (Signed)
Summary: Peer to Peer review w/MD for AMI  Phone Note Other Incoming   Caller: Mary from [facility] Summary of Call: Request a peer to peer review with their MD for the CT Abdome and Pelvis. Call before 4 today is required. Call at [phone] Initial call taken by: Kathlene November LPN,  June 16, 2010 7:59 AM  Follow-up for Phone Call        I called and got her OKd for CT abdomen without pelvis -- this is OK.  #[id number] Follow-up by: Seymour Bars DO,  June 16, 2010 12:33 PM

## 2010-09-07 NOTE — Assessment & Plan Note (Signed)
Summary: f/u DM   Vital Signs:  Patient profile:   55 year old female Menstrual status:  hysterectomy Height:      65.75 inches Weight:      228.2 pounds BMI:     37.25 Pulse rate:   72 / minute BP sitting:   118 / 78  Primary Care Provider:  Seymour Bars D.O.   History of Present Illness: 55 yo obese WF presents for overdue f/u for T2DM.  She reports fairly good medication compliance with glipizide 5 mg two times a day.  Denies lows.  Not checking sugars at home.  "Lost" her meter when her husband left her a few months ago.  She has been going thru a lot of emotions but declines offfer for counseling.  She is needing her Xanax to sleep each night.  She has not been exercising and is still struggling w/ her weight.  Denies CP, ABd pain or SOB.  She failed metformin in the past due to GI upset.    She is concerned about her fam hx of pancreatic cancer and is interested in getting screened.     Allergies: 1)  ! Hydrochlorothiazide (Hydrochlorothiazide)  Past History:  Past Medical History: Reviewed history from 12/15/2008 and no changes required. Hx of kidney stones DM HTN obesity anxiety  Echo 5-09 with [facility]  Past Surgical History: Reviewed history from 02/13/2009 and no changes required. 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  3 adult children with prior spouse.  Quit smoking 2005, no etoh, no drugs, 2 caff/day, no reg exercise.  Review of Systems      See HPI  Physical Exam  General:  obese WF in NAD Head:  normocephalic and atraumatic.   Eyes:  pupils equal, pupils round, and pupils reactive to light.   Mouth:  pharynx pink and moist.   Neck:  no masses.   Lungs:  normal respiratory  effort, no intercostal retractions, no accessory muscle use, and normal breath sounds.   Heart:  Normal rate and regular rhythm. S1 and S2 normal without gallop, murmur, click, rub or other extra sounds. Abdomen:  soft and non-tender.   Extremities:  no LE edema no cyanosis Skin:  color normal.   Cervical Nodes:  No lymphadenopathy noted Psych:  good eye contact, not anxious appearing, and not depressed appearing.     Impression & Recommendations:  Problem # 1:  DIABETES MELLITUS II, UNCOMPLICATED (ICD-250.00) A1C rise from 7 to 8 with poor diet, obesity and lack of exercise and emotional stressors. Continue Glipizde at current dose.  Add Actos once daily for insulin resistance.  New RX for a meter with test strips given.  Will have her start checking AM fastings and RTC to review log book and check wt in 2 mos.  Her urine micro is UTD but it looks like her diabetic eye exam is due.   Her updated medication list for this problem includes:    Glucotrol 5 Mg Tabs (Glipizide) .Marland Kitchen... 1 tab by mouth two times a day with meals    Azor 5-40 Mg Tabs (Amlodipine-olmesartan) .Marland Kitchen... 1 tab by mouth daily    Aspir-low 81 Mg Tbec (Aspirin) .Marland Kitchen... 1 tab by mouth daily    Actos 30 Mg Tabs (  Pioglitazone hcl) .Marland Kitchen... 1 tab by mouth once daily  Orders: TLB-A1C / Hgb A1C (Glycohemoglobin) (83036-A1C)  Problem # 2:  HYPERTENSION, BENIGN SYSTEMIC (ICD-401.1) Assessment: Improved At goal. Her updated medication list for this problem includes:    Azor 5-40 Mg Tabs (Amlodipine-olmesartan) .Marland Kitchen... 1 tab by mouth daily    Atenolol 50 Mg Tabs (Atenolol) .Marland Kitchen... Take 1 tab po once daily  BP today: 118/78 Prior BP: 142/82 (06/02/2009)  Prior 10 Yr Risk Heart Disease: 11 % (04/24/2008)  Labs Reviewed: K+: 4.3 (02/14/2009) Creat: : 0.98 (02/14/2009)   Chol: 195 (02/14/2009)   HDL: 46 (02/14/2009)   LDL: 114 (02/14/2009)   TG: 177 (02/14/2009)  Problem # 3:  ANXIETY STATE NOS (ICD-300.00) Off Pristiq.  Using Xanax at  night for sleep.  I changed her to Temazepam which seem less habit forming and has a longer half life for sleep.  Consider adding Zoloft as adjunt but at this time, she is resistant. The following medications were removed from the medication list:    Pristiq 50 Mg Xr24h-tab (Desvenlafaxine succinate) .Marland Kitchen... 1 tab by mouth daily x 1 wk then take every other day for a wk then stop.  Problem # 4:  OBESITY NOS (ICD-278.00) BMI 37 c/w Class II obesity. 7 # gain from last visit in the Fall. She agrees to work on her diet and exercise.  Consider a diabetes nutrition refresher course.  Problem # 5:  DYSLIPIDEMIA (ICD-272.4) Labs UTD. Her updated medication list for this problem includes:    Simvastatin 40 Mg Tabs (Simvastatin) .Marland Kitchen... 1 tab by mouth qhs  Labs Reviewed: SGOT: 22 (02/14/2009)   SGPT: 27 (02/14/2009)  Prior 10 Yr Risk Heart Disease: 11 % (04/24/2008)   HDL:46 (02/14/2009), 39 (01/29/2008)  LDL:114 (02/14/2009), 128 (01/29/2008)  Chol:195 (02/14/2009), 198 (01/29/2008)  Trig:177 (02/14/2009), 156 (01/29/2008)  Complete Medication List: 1)  Glucotrol 5 Mg Tabs (Glipizide) .Marland Kitchen.. 1 tab by mouth two times a day with meals 2)  Simvastatin 40 Mg Tabs (Simvastatin) .Marland Kitchen.. 1 tab by mouth qhs 3)  Azor 5-40 Mg Tabs (Amlodipine-olmesartan) .Marland Kitchen.. 1 tab by mouth daily 4)  Aspir-low 81 Mg Tbec (Aspirin) .Marland Kitchen.. 1 tab by mouth daily 5)  True Care Test Strips  .... Use twice daily as directed 6)  Temazepam 15 Mg Caps (Temazepam) .Marland Kitchen.. 1 capsule by mouth qhs 7)  Atenolol 50 Mg Tabs (Atenolol) .... Take 1 tab po once daily 8)  Ibuprofen 800 Mg Tabs (Ibuprofen) .Marland Kitchen.. 1 tab by mouth three times a day with food x 10 days 9)  Accu-check Aviva  .... Glucometer use as directed dx 250.00 10)  Accu-check Aviva Test Strips  .... Use twice daily as directed 11)  Actos 30 Mg Tabs (Pioglitazone hcl) .Marland Kitchen.. 1 tab by mouth once daily  Patient Instructions: 1)  I will contact our genetic specialist about screening for  pancreatic cancer. 2)  Add Actos 30 mg once daily to Glipizide two times a day with meals for sugars. 3)  Work on diabetic diet and exercise. 4)  Change Alprazolam to Temazepam for sleep at night. 5)  REturn for f/u with sugar readings in 8 wks. Prescriptions: ACTOS 30 MG TABS (PIOGLITAZONE HCL) 1 tab by mouth once daily  #30 x 3   Entered and Authorized by:   Seymour Bars DO   Signed by:   Seymour Bars DO on 09/11/2009   Method used:   Electronically to        [facility]* (retail)  [address], [place]  [address]       Ph: [phone]       Fax: [phone]   RxID:   [id number] ACCU-CHECK AVIVA TEST STRIPS use twice daily as directed  #60 x 3   Entered and Authorized by:   Seymour Bars DO   Signed by:   Seymour Bars DO on 09/11/2009   Method used:   Printed then faxed to ...       [facility]* (retail)       [address] [place], [place]  [address]       Ph: [phone]       Fax: [phone]   RxID:   [id number] ACCU-CHECK AVIVA glucometer use as directed Dx 250.00  #1 x 0   Entered and Authorized by:   Seymour Bars DO   Signed by:   Seymour Bars DO on 09/11/2009   Method used:   Printed then faxed to ...       [facility]* (retail)       [address] [place], [place]  [address]       Ph: [phone]       Fax: [phone]   RxID:   [id number] TEMAZEPAM 15 MG CAPS (TEMAZEPAM) 1 capsule by mouth qhs  #30 x 2   Entered and Authorized by:   Seymour Bars DO   Signed by:   Seymour Bars DO on 09/11/2009   Method used:   Printed then faxed to ...       [facility]* (retail)       [address] [place], [place]  [address]       Ph: [phone]       Fax: [phone]   RxID:   [id number]   Laboratory Results   Blood Tests     HGBA1C: 8.2%   (Normal Range: Non-Diabetic - 3-6%   Control Diabetic - 6-8%)

## 2010-09-07 NOTE — Progress Notes (Signed)
Summary: Levemir too expensive  Phone Note Call from Patient   Caller: Patient Summary of Call: Pt can not afford Levemir and we do not have any savings cards. Initial call taken by: Payton Spark CMA,  March 24, 2010 3:18 PM  Follow-up for Phone Call        fill out pt assistance sheet from [employer] and submit for cost savings.  pick up samples for now if needed. Follow-up by: Seymour Bars DO,  March 24, 2010 3:43 PM  Additional Follow-up for Phone Call Additional follow up Details #1::        Pt aware Additional Follow-up by: Payton Spark CMA,  March 24, 2010 4:15 PM

## 2010-09-07 NOTE — Letter (Signed)
Summary: Patient No Show/[facility]  Patient No Show/[facility]   Imported By: Lanelle Bal 12/25/2009 11:42:55  _____________________________________________________________________  External Attachment:    Type:   Image     Comment:   External Document

## 2010-09-09 NOTE — Assessment & Plan Note (Signed)
Summary: gastroenteritis   Vital Signs:  Patient profile:   55 year old female Menstrual status:  hysterectomy Height:      65.75 inches Weight:      225 pounds BMI:     36.72 O2 Sat:      96 % on Room air Temp:     98.3 degrees F oral Pulse rate:   89 / minute BP sitting:   127 / 75  (left arm) Cuff size:   large  Vitals Entered By: Payton Spark CMA (August 10, 2010 11:25 AM)  O2 Flow:  Room air CC: nausea, fever, HA, vomiting and achy x 3 days.   Primary Care Provider:  Seymour Bars D.O.  CC:  nausea, fever, HA, and vomiting and achy x 3 days.Marland Kitchen  History of Present Illness: 55 yo WF presents for 3 days of nausea and vomitting with fever and poor appetite.  she has had bodyaches.  She denies any abdominal pain.  She denies any diarrhea.  She has been taking tylenol.  She took some phenergan which did knock her out.  She cannot hold water down but is holding sprite down.  She is not taking her insulin and has not been checking her sugars.    She is having a HA and some lightheadedness.  Had had 2 episodes of vomitting yesterday and today.      Current Medications (verified): 1)  Glucotrol 5 Mg Tabs (Glipizide) .Marland Kitchen.. 1 Tab By Mouth Two Times A Day With Meals 2)  Simvastatin 40 Mg Tabs (Simvastatin) .Marland Kitchen.. 1 Tab By Mouth Qhs 3)  Azor 5-40 Mg Tabs (Amlodipine-Olmesartan) .Marland Kitchen.. 1 Tab By Mouth Daily 4)  Aspir-Low 81 Mg Tbec (Aspirin) .Marland Kitchen.. 1 Tab By Mouth Daily 5)  Atenolol 50 Mg Tabs (Atenolol) .... Take 1 Tab Po Once Daily 6)  Alprazolam 0.5 Mg Tabs (Alprazolam) .Marland Kitchen.. 1 Tab By Mouth Two Times A Day As Needed Anxiety 7)  Lantus Solostar 100 Unit/ml Soln (Insulin Glargine) .... 35 Units [place] Injection Qam 8)  Pen Needles 1/2" 29g X 12mm Misc (Insulin Pen Needle) .... Use Daily As Directed  Allergies (verified): 1)  ! Hydrochlorothiazide (Hydrochlorothiazide) 2)  ! * Victoza 3)  ! * Metformin  Past History:  Past Medical History: Reviewed history from 06/15/2010 and no  changes required. Hx of kidney stones DM HTN obesity anxiety fatty liver dz  Echo 5-09 with [facility]  Past Surgical History: Reviewed history from 02/13/2009 and no changes required. Complete hysterectomy with BSO  for dysplasia  kidney stones in 20s, 30s, s/sec lithotripsy  in [place] 08-2008  Social History: Reviewed history from 02/27/2010 and no changes required. 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   3 adult children with prior spouse.  Quit smoking 2005, no etoh, no drugs,  2 caff/day, no reg exercise.  Review of Systems      See HPI  Physical Exam  General:  alert, well-developed, and well-nourished.  obese Head:  normocephalic and atraumatic.   Eyes:  sclera non icteric Mouth:  o/p fairly moist and pink Neck:  no masses.   Lungs:  Normal respiratory effort, chest expands symmetrically. Lungs are clear to auscultation, no crackles or wheezes. Heart:  Normal rate and regular rhythm. S1 and S2 normal without gallop, murmur, click, rub or other extra sounds. Abdomen:  soft.  no epigastric TTP and neg Murphys sign, mildly distended. Skin:  color normal.  no jaundice, pallor or diaphoresis  Cervical Nodes:  No lymphadenopathy noted Psych:  good eye contact, not anxious appearing, and not depressed appearing.     Impression & Recommendations:  Problem # 1:  GASTROENTERITIS, ACUTE (ICD-558.9)  Day 3 viral gastroenteritis with N/V and no sign of clinical dehydration.   Treated in the office (has a driver today) with Phenergan 25 mg IM.  RX for Odansetron sent to pharmacy. Clear liquids, rest, ibuprofen as needed HAs.  Advance to bland diet once nausea improves. Call if not resolved by FRI. Labs today to r/o cholecystitis, hepatitis and pancreatitis. Her updated medication list for this problem includes:    Ondansetron 8 Mg Tbdp (Ondansetron) .Marland Kitchen... 1 tab by mouth three times a day as needed  nausea  Orders: Promethazine up to 50mg  (J2550) Admin of Therapeutic Inj  intramuscular or subcutaneous (04540)  Complete Medication List: 1)  Glucotrol 5 Mg Tabs (Glipizide) .Marland Kitchen.. 1 tab by mouth two times a day with meals 2)  Simvastatin 40 Mg Tabs (Simvastatin) .Marland Kitchen.. 1 tab by mouth qhs 3)  Azor 5-40 Mg Tabs (Amlodipine-olmesartan) .Marland Kitchen.. 1 tab by mouth daily 4)  Aspir-low 81 Mg Tbec (Aspirin) .Marland Kitchen.. 1 tab by mouth daily 5)  Atenolol 50 Mg Tabs (Atenolol) .... Take 1 tab po once daily 6)  Alprazolam 0.5 Mg Tabs (Alprazolam) .Marland Kitchen.. 1 tab by mouth two times a day as needed anxiety 7)  Lantus Solostar 100 Unit/ml Soln (Insulin glargine) .... 35 units [place] injection qam 8)  Pen Needles 1/2" 29g X 12mm Misc (Insulin pen needle) .... Use daily as directed 9)  Ondansetron 8 Mg Tbdp (Ondansetron) .Marland Kitchen.. 1 tab by mouth three times a day as needed nausea  Other Orders: T-Lipase (98119-14782) T-Comprehensive Metabolic Panel (95621-30865)  Patient Instructions: 1)  Phenergan injection given today. 2)  It will make you sleepy for the next 4-6 hrs. 3)  If nausea returns after 4-6 hrs, you can use the RX Odansetron. 4)  Bland diet, clear liquids.  Advance diet to bland foods once nausea improves. 5)  Rest, hydrate and use Ibuprofen as needed for aches and pains. 6)  Labs today. 7)  Call if not improved by FRI.   Prescriptions: ONDANSETRON 8 MG TBDP (ONDANSETRON) 1 tab by mouth three times a day as needed nausea  #20 x 0   Entered and Authorized by:   Seymour Bars DO   Signed by:   Seymour Bars DO on 08/10/2010   Method used:   Electronically to        [facility] [phone]* (retail)       [address]       [place], [place]  [address]       Ph: [phone]       Fax: [phone]   RxID:   [id number]    Medication Administration  Injection # 1:    Medication: Promethazine up to 50mg     Diagnosis: GASTROENTERITIS, ACUTE (ICD-558.9)    Route: IM    Site: LUOQ gluteus    Exp Date: 01/2012     Lot #: [id number]    Comments: 25mg     Patient tolerated injection without complications    Given by: Payton Spark CMA (August 10, 2010 12:01 PM)  Orders Added: 1)  T-Lipase [id number] 2)  T-Comprehensive Metabolic Panel [[id number]] 3)  Est. Patient Level III [16010] 4)  Promethazine up to 50mg  [J2550] 5)  Admin of Therapeutic Inj  intramuscular or subcutaneous [96372]     Medication Administration  Injection # 1:  Medication: Promethazine up to 50mg     Diagnosis: GASTROENTERITIS, ACUTE (ICD-558.9)    Route: IM    Site: LUOQ gluteus    Exp Date: 01/2012    Lot #: [id number]    Comments: 25mg     Patient tolerated injection without complications    Given by: Payton Spark CMA (August 10, 2010 12:01 PM)  Orders Added: 1)  T-Lipase [id number] 2)  T-Comprehensive Metabolic Panel [[id number]] 3)  Est. Patient Level III [01093] 4)  Promethazine up to 50mg  [J2550] 5)  Admin of Therapeutic Inj  intramuscular or subcutaneous [23557]

## 2010-09-09 NOTE — Assessment & Plan Note (Signed)
Summary: f/u DM   Vital Signs:  Patient profile:   55 year old female Menstrual status:  hysterectomy Height:      65.75 inches Weight:      224 pounds BMI:     36.56 O2 Sat:      96 % on Room air Pulse rate:   79 / minute BP sitting:   132 / 81  (left arm) Cuff size:   large  Vitals Entered By: Payton Spark CMA (August 27, 2010 1:57 PM)  O2 Flow:  Room air CC: F/U DM.    Primary Care Provider:  Seymour Bars D.O.  CC:  F/U DM. Marland Kitchen  History of Present Illness: 55 yo WF presents for RF of her Xanax and Azor.  Her labs are UTD.  Her A1C is high at 11 b/c she has not been taking her Lantus due to cost.  She is picking up a sample today.  She applied for savings program but she apparently did not qualify.  She denies CP or DOE.  She has recently started exercising and eating better.    Current Medications (verified): 1)  Glucotrol 5 Mg Tabs (Glipizide) .Marland Kitchen.. 1 Tab By Mouth Two Times A Day With Meals 2)  Simvastatin 40 Mg Tabs (Simvastatin) .Marland Kitchen.. 1 Tab By Mouth Qhs 3)  Azor 5-40 Mg Tabs (Amlodipine-Olmesartan) .Marland Kitchen.. 1 Tab By Mouth Daily 4)  Aspir-Low 81 Mg Tbec (Aspirin) .Marland Kitchen.. 1 Tab By Mouth Daily 5)  Atenolol 50 Mg Tabs (Atenolol) .... Take 1 Tab Po Once Daily 6)  Alprazolam 0.5 Mg Tabs (Alprazolam) .Marland Kitchen.. 1 Tab By Mouth Two Times A Day As Needed Anxiety 7)  Lantus Solostar 100 Unit/ml Soln (Insulin Glargine) .... 35 Units [place] Injection Qam 8)  Pen Needles 1/2" 29g X 12mm Misc (Insulin Pen Needle) .... Use Daily As Directed 9)  Ondansetron 8 Mg Tbdp (Ondansetron) .Marland Kitchen.. 1 Tab By Mouth Three Times A Day As Needed Nausea  Allergies (verified): 1)  ! Hydrochlorothiazide (Hydrochlorothiazide) 2)  ! * Victoza 3)  ! * Metformin  Past History:  Past Medical History: Reviewed history from 06/15/2010 and no changes required. Hx of kidney stones DM HTN obesity anxiety fatty liver dz  Echo 5-09 with [facility]  Social History: Reviewed history from 02/27/2010 and no  changes required. 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   3 adult children with prior spouse.  Quit smoking 2005, no etoh, no drugs,  2 caff/day, no reg exercise.  Review of Systems      See HPI  Physical Exam  General:  alert, well-developed, well-nourished, and well-hydrated.  obese Eyes:  pupils equal, pupils round, and pupils reactive to light.   Mouth:  pharynx pink and moist.   Lungs:  Normal respiratory effort, chest expands symmetrically. Lungs are clear to auscultation, no crackles or wheezes. Heart:  Normal rate and regular rhythm. S1 and S2 normal without gallop, murmur, click, rub or other extra sounds. Skin:  color normal.   Psych:  good eye contact, not anxious appearing, and not depressed appearing.     Impression & Recommendations:  Problem # 1:  DIAB W/O MENTION COMP TYPE II/UNS TYPE UNCNTRL (ICD-250.02) Poor control with A1C 11.7 due to pt non adherence with insulin due to cost.  Will continue to provide her samples of Lantus Solostar and made sure she was taking Glipizide 2 x a day with food.   Her updated medication list for this problem includes:  Glucotrol 5 Mg Tabs (Glipizide) .Marland Kitchen... 1 tab by mouth two times a day with meals    Azor 5-40 Mg Tabs (Amlodipine-olmesartan) .Marland Kitchen... 1 tab by mouth daily    Aspir-low 81 Mg Tbec (Aspirin) .Marland Kitchen... 1 tab by mouth daily    Lantus Solostar 100 Unit/ml Soln (Insulin glargine) .Marland KitchenMarland KitchenMarland KitchenMarland Kitchen 35 units [place] injection qam  Labs Reviewed: Creat: 1.17 (08/11/2010)   Microalbumin: 150 (03/19/2010)  Last Eye Exam: no retinopathy (Dr Sherrie George) (02/01/2010) Reviewed HgBA1c results: 11.7 (08/27/2010)  10.8 (03/19/2010)  Problem # 2:  ANXIETY STATE NOS (ICD-300.00) Stable.  RFd her Xanax.   Her updated medication list for this problem includes:    Alprazolam 0.5 Mg Tabs (Alprazolam) .Marland Kitchen... 1 tab by mouth two times a day as needed anxiety  Problem # 3:  HYPERTENSION, BENIGN SYSTEMIC (ICD-401.1) Labs  UTD.  BP goal is <130/80, close to that today.  RFd her AZOR. Her updated medication list for this problem includes:    Azor 5-40 Mg Tabs (Amlodipine-olmesartan) .Marland Kitchen... 1 tab by mouth daily    Atenolol 50 Mg Tabs (Atenolol) .Marland Kitchen... Take 1 tab po once daily  BP today: 132/81 Prior BP: 127/75 (08/10/2010)  Prior 10 Yr Risk Heart Disease: 11 % (04/24/2008)  Labs Reviewed: K+: 4.4 (08/11/2010) Creat: : 1.17 (08/11/2010)   Chol: 164 (04/23/2010)   HDL: 38 (04/23/2010)   LDL: 102 (04/23/2010)   TG: 122 (04/23/2010)  Problem # 4:  DYSLIPIDEMIA (ICD-272.4) Labs UTD and doing well on Simvastatin, continue. Her updated medication list for this problem includes:    Simvastatin 40 Mg Tabs (Simvastatin) .Marland Kitchen... 1 tab by mouth qhs  Labs Reviewed: SGOT: 27 (08/11/2010)   SGPT: 27 (08/11/2010)  Prior 10 Yr Risk Heart Disease: 11 % (04/24/2008)   HDL:38 (04/23/2010), 46 (02/14/2009)  LDL:102 (04/23/2010), 114 (02/14/2009)  Chol:164 (04/23/2010), 195 (02/14/2009)  Trig:122 (04/23/2010), 177 (02/14/2009)  Complete Medication List: 1)  Glucotrol 5 Mg Tabs (Glipizide) .Marland Kitchen.. 1 tab by mouth two times a day with meals 2)  Simvastatin 40 Mg Tabs (Simvastatin) .Marland Kitchen.. 1 tab by mouth qhs 3)  Azor 5-40 Mg Tabs (Amlodipine-olmesartan) .Marland Kitchen.. 1 tab by mouth daily 4)  Aspir-low 81 Mg Tbec (Aspirin) .Marland Kitchen.. 1 tab by mouth daily 5)  Atenolol 50 Mg Tabs (Atenolol) .... Take 1 tab po once daily 6)  Alprazolam 0.5 Mg Tabs (Alprazolam) .Marland Kitchen.. 1 tab by mouth two times a day as needed anxiety 7)  Lantus Solostar 100 Unit/ml Soln (Insulin glargine) .... 35 units [place] injection qam 8)  Pen Needles 1/2" 29g X 12mm Misc (Insulin pen needle) .... Use daily as directed  Other Orders: Fingerstick (36416) Hemoglobin A1C (64403)  Patient Instructions: 1)  Continue current meds. 2)  OK to come back next month to pick up more samples of Lantus Solostar. 3)  BP at goal. 4)  Return for f/u in 3 mos. Prescriptions: AZOR 5-40 MG TABS  (AMLODIPINE-OLMESARTAN) 1 tab by mouth daily  #30 x 6   Entered and Authorized by:   Seymour Bars DO   Signed by:   Seymour Bars DO on 08/27/2010   Method used:   Electronically to        [facility] [phone]* (retail)       [address]       [place], [place]  [address]       Ph: [phone]       Fax: [phone]   RxID:   [id number]    Orders Added: 1)  Fingerstick [02542] 2)  Hemoglobin A1C [83036] 3)  Est. Patient Level IV [[address]]    Laboratory Results   Blood Tests     HGBA1C: 11.7%   (Normal Range: Non-Diabetic - 3-6%   Control Diabetic - 6-8%)

## 2010-10-28 ENCOUNTER — Other Ambulatory Visit: Payer: Self-pay | Admitting: Family Medicine

## 2010-10-28 DIAGNOSIS — F419 Anxiety disorder, unspecified: Secondary | ICD-10-CM

## 2010-11-09 ENCOUNTER — Other Ambulatory Visit: Payer: Self-pay | Admitting: Family Medicine

## 2010-11-24 ENCOUNTER — Other Ambulatory Visit: Payer: Self-pay | Admitting: Family Medicine

## 2010-12-22 ENCOUNTER — Other Ambulatory Visit: Payer: Self-pay | Admitting: Family Medicine

## 2010-12-24 NOTE — Op Note (Signed)
NAMEMERCEDIES, GANESH             ACCOUNT NO.:  [id number]   MEDICAL RECORD NO.:  [id number]          PATIENT TYPE:  AMB   LOCATION:  SDS                          FACILITY:  [facility]   PHYSICIAN:  Thomas A. Cornett, M.D.DATE OF BIRTH:  12-30-1955   DATE OF PROCEDURE:  DATE OF DISCHARGE:                               OPERATIVE REPORT   PREOPERATIVE DIAGNOSIS:  Large lipoma upper back.   POSTOPERATIVE DIAGNOSIS:  Large lipoma upper back.   PROCEDURE:  Excision lipoma upper back measuring 10 x 15 cm.   SURGEON:  Dr. Harriette Bouillon.   ANESTHESIA:  General endotracheal anesthesia with 20 mL of 0.25%  Sensorcaine.   ESTIMATED BLOOD LOSS:  20 mL.   SPECIMEN:  Roughly 10 x 15 cm mass of fat to pathology felt to be  lipoma.   DRAINS:  None.   INDICATIONS FOR PROCEDURE:  The patient is a 55 year old female who  presents with a very large lipoma at the base of her upper back and neck  region.  It causes her to be unable to extend her neck without  discomfort and is in the way when she lays on it.  She is here today for  excision of this due to the above symptoms.  Informed consent was  obtained.  The patient agreed to proceed.   DESCRIPTION OF PROCEDURE:  The patient was brought to the operating  room, placed supine.  After induction of general anesthesia, the patient  was then placed prone and was appropriately padded.  The upper back  region was prepped and draped in a sterile fashion.  I used tape to pull  down the skin to help better expose the mass which was at the base of  her neck.  A transverse incision was made.  A large area of fat was  encountered  and I dissected this out circumferentially under the skin.  The fat was removed off the fascia of the muscle and was not involving  the muscle.  The entire area was taken out in two separate pieces.  Once  we did this, I then irrigated the cavity and found it to be hemostatic.  I then closed the cavity in layers using 3-0 Vicryl  for a deep layer and  4-0 Monocryl for a  subcuticular layer.  Steri-Strips and dry dressings were applied.  The  patient was then placed back supine, extubated and taken to recovery in  satisfactory condition.  All final counts of sponge, needle and  instruments were found be correct at this portion of the case.      Thomas A. Cornett, M.D.  Electronically Signed     TAC/MEDQ  D:  10/19/2006  T:  10/20/2006  Job:  [id number]   cc:   Nani Gasser, M.D.

## 2011-01-09 ENCOUNTER — Encounter: Payer: Self-pay | Admitting: Family Medicine

## 2011-01-10 ENCOUNTER — Ambulatory Visit: Payer: Self-pay | Admitting: Family Medicine

## 2011-01-27 ENCOUNTER — Other Ambulatory Visit: Payer: Self-pay | Admitting: Family Medicine

## 2011-02-18 ENCOUNTER — Other Ambulatory Visit: Payer: Self-pay | Admitting: Family Medicine

## 2011-02-18 NOTE — Telephone Encounter (Signed)
Patient called left voicemail.. Patient states she was laid off from her job and she will not be able to afford her meds she is currently taking. Patient states she is out of her 2 bp meds she takes and one has been filled but the cost is too expensive and need to have something cheaper and the other bp med needs Dr. Martyn Ehrich before it can be refilled...Marland KitchenMarland Kitchen

## 2011-02-21 MED ORDER — ATENOLOL 50 MG PO TABS: 50.0000 mg | ORAL_TABLET | Freq: Every day | ORAL | Status: DC

## 2011-02-21 MED ORDER — AMLODIPINE BESYLATE 5 MG PO TABS: 5.0000 mg | ORAL_TABLET | Freq: Every day | ORAL | Status: DC

## 2011-02-21 MED ORDER — LISINOPRIL 40 MG PO TABS: 40.0000 mg | ORAL_TABLET | Freq: Every day | ORAL | Status: DC

## 2011-02-21 NOTE — Telephone Encounter (Signed)
I refilled her atenolol. She is unable to afford a sore I called into separate prescriptions. One is for amlodipine 5 mg daily. The other is lisinopril which is similar to losartan once a day. The atenolol and lisinopril should be $4 at [facility]. The amlodipine should also be very inexpensive.

## 2011-02-22 ENCOUNTER — Telehealth: Payer: Self-pay | Admitting: Family Medicine

## 2011-02-22 NOTE — Telephone Encounter (Signed)
Pt calling and said she was waiting yesterday on a call from our office.  Suppose to have made a call to her about needing med changes.  Pt lost job and cannot affor d the current BP med she is on. Plan:  Reviewed the chart and according to pt chart a telephone call was rec and Dr. Linford Arnold had called in two diff $4 scripts to [facility].  Pt informed. [name], LPN [facility] Dimes

## 2011-02-28 ENCOUNTER — Other Ambulatory Visit: Payer: Self-pay | Admitting: Family Medicine

## 2011-03-04 ENCOUNTER — Telehealth: Payer: Self-pay | Admitting: Family Medicine

## 2011-03-04 NOTE — Telephone Encounter (Signed)
Pt aware.

## 2011-03-04 NOTE — Telephone Encounter (Signed)
Pt needs and office visit for this.

## 2011-03-04 NOTE — Telephone Encounter (Signed)
Pt called in and has been experiencing legs and feet cramping.  Please advise since she was started on two new BP meds recently and the azor was discontinued.  Pt does not have any swelling and she went last night to the store and purchased something OTC for for the cramping.  Pt could not tell me the name of what she purchased for the cramping. Please advise. [name], LPN [facility]

## 2011-03-22 ENCOUNTER — Other Ambulatory Visit: Payer: Self-pay | Admitting: Family Medicine

## 2011-03-24 ENCOUNTER — Ambulatory Visit (INDEPENDENT_AMBULATORY_CARE_PROVIDER_SITE_OTHER): Payer: Self-pay | Admitting: Family Medicine

## 2011-03-24 ENCOUNTER — Encounter: Payer: Self-pay | Admitting: Family Medicine

## 2011-03-24 DIAGNOSIS — E119 Type 2 diabetes mellitus without complications: Secondary | ICD-10-CM

## 2011-03-24 DIAGNOSIS — I10 Essential (primary) hypertension: Secondary | ICD-10-CM

## 2011-03-24 DIAGNOSIS — E785 Hyperlipidemia, unspecified: Secondary | ICD-10-CM

## 2011-03-24 MED ORDER — INSULIN DETEMIR 100 UNIT/ML ~~LOC~~ SOLN: 15.0000 [IU] | Freq: Every day | SUBCUTANEOUS | Status: DC

## 2011-03-24 NOTE — Assessment & Plan Note (Signed)
She is very poorly controlled. Unfortunately she does not have insurance and this makes it more difficult. I did give her a sample of Levemir today and asked her to start giving herself 15 units at bedtime. She can call in another week or 2 to see if we have any more samples. I recommended she go onto never noticed that site to get the patient assessment sheet and completed in job and often considered she will qualify for medication strictly to the company. She did have a monofilament exam today. We did not perform a Microvena and secondary cost concerns. She is already on an ACE inhibitor. I would like to see her back in 3 months to make sure that we are on track. The only outlet to see her sooner but there again there are constraints with cost since she does not have insurance. Lab Results  Component Value Date   HGBA1C 12.0 03/24/2011

## 2011-03-24 NOTE — Assessment & Plan Note (Signed)
Her blood pressure looks fantastic today. Continue current regimen.

## 2011-03-24 NOTE — Assessment & Plan Note (Signed)
I recommended she go to the [facility] to get point-of-care lipid panel. It's much cheaper than getting a blood draw. And have them fax me the results review. That way we can adjust her statin if we need to.

## 2011-03-24 NOTE — Progress Notes (Signed)
  Subjective:    Patient ID: Erin [name], female    DOB: [birth year], 55 y.o.   MRN: [id number]  HPI Hypertension-patient has not been here in almost a year because she lost her job and lost her medical insurance. Says had 1-2 episodes of low blood pressure. Right now he visoin feels blurry. She says she has been taking her blood pressure pills regularly. She does note that she takes her blood pressure pills at different times of day.  Diabetes - Still on the glipizide. Couldn't tolerate metformin. Did take th insulin bc of cost.She is out of insurance now. She hasn't been able to check her sugars as she lost her machine. She says she has been taking her glipizide regularly. She has had some vision changes as well as increased thirst and urination. She also notes she has been more fatigued.   Still on cholesterol pill at night. Says she is consistent with this.  Review of Systems     Objective:   Physical Exam  Constitutional: She appears well-developed and well-nourished.  HENT:  Head: Normocephalic and atraumatic.  Cardiovascular: Normal rate, regular rhythm and normal heart sounds.   Pulmonary/Chest: Effort normal and breath sounds normal.  Musculoskeletal: She exhibits no edema.  Skin: Skin is warm and dry.  Psychiatric: She has a normal mood and affect.          Assessment & Plan:

## 2011-04-04 ENCOUNTER — Other Ambulatory Visit: Payer: Self-pay | Admitting: Family Medicine

## 2011-04-04 ENCOUNTER — Other Ambulatory Visit: Payer: Self-pay | Admitting: *Deleted

## 2011-04-04 MED ORDER — ALPRAZOLAM 0.5 MG PO TABS: 0.5000 mg | ORAL_TABLET | Freq: Two times a day (BID) | ORAL | Status: DC | PRN

## 2011-04-04 NOTE — Telephone Encounter (Addendum)
Pt called and said she was seen recently and needs refills.  They were supposed to have been sent but wasn't. Plan:  Reviewed the pt med list in chart file and two meds were sent recently.  One on 03-22-11, and one on 03-24-11.  Not sure which other meds pt is referring to.  LMOM for the pt to call and speak directly with the triage nurse so that her refills can be taken care of today. Pending pt call back. [name], LPN [facility]   Pt returned the triage nurse call at 1:05pm.  Pt states she needs her xanex refilled.  Refilled and printed, and faxed to [facility] [place]. [name], LPN [facility] Dimes

## 2011-04-21 ENCOUNTER — Telehealth: Payer: Self-pay | Admitting: Family Medicine

## 2011-04-21 NOTE — Telephone Encounter (Signed)
Pt called and is asking for levemir flex pen samples.  Pt doesn't have insurance. Plan:  Checked and currently our office does not have any samples and pt asked what is she to do?  Told pt I'll send a message to the provider to ask recommendations. [name], LPN [facility]

## 2011-04-21 NOTE — Telephone Encounter (Signed)
I think we have lantus vial which she could use instead.

## 2011-04-22 NOTE — Telephone Encounter (Signed)
Pt notified and had to [facility] that she could pup a sample of lantus pen instead of the levemir pen today but to call to speak with triage nurse to let her know she is picking it up. [name], LPN [facility]

## 2011-04-26 ENCOUNTER — Other Ambulatory Visit: Payer: Self-pay | Admitting: Family Medicine

## 2011-04-26 MED ORDER — SIMVASTATIN 40 MG PO TABS: 40.0000 mg | ORAL_TABLET | Freq: Every day | ORAL | Status: DC

## 2011-04-26 NOTE — Telephone Encounter (Signed)
Received fax request for RF of simvastatin 40 mg. Plan:  Pt has current appt but last Labs were 04-27-11 and pt needs to repeat since it has been over 6 mths.  Will refill #30 without any refills. [name], LPN [facility]

## 2011-05-02 ENCOUNTER — Other Ambulatory Visit: Payer: Self-pay | Admitting: Family Medicine

## 2011-05-04 ENCOUNTER — Other Ambulatory Visit: Payer: Self-pay | Admitting: Family Medicine

## 2011-05-04 MED ORDER — ALPRAZOLAM 0.5 MG PO TABS: 0.5000 mg | ORAL_TABLET | Freq: Two times a day (BID) | ORAL | Status: DC | PRN

## 2011-05-04 NOTE — Telephone Encounter (Signed)
Pt calling for refill of her xanex 0.5 mg. Plan:  Script printed for provider to sign, and faxed to pharmacy. [name], LPN [facility]

## 2011-05-16 ENCOUNTER — Telehealth: Payer: Self-pay | Admitting: Family Medicine

## 2011-05-16 NOTE — Telephone Encounter (Signed)
Pt called and asked for levemir insulin pen sample.   Plan:  We do not have any samples and pt said she didn't know what she was going to do because she does not have any money to get script at pharmacy. Routed encounter to Dr. Linford Arnold to see if another sample can be given of diff insulin pen. [name], LPN [facility]'

## 2011-05-16 NOTE — Telephone Encounter (Signed)
We can call the rep and see if they can drop some off.

## 2011-05-17 NOTE — Telephone Encounter (Signed)
LMOM for pharmaceutical rep that distributes levemir flex pen.  Asked him to bring some samples, and to call the triage nurse and let her know if possible to do today.  Let the pt know trying to get samples. [name], LPN [facility]    Plan:  Pt notified that the levemir rep did return call and is expecting shipment of levemir today and will bring [place] some samples today.  Pt was told will call her once levemir samples received  Today. [name], LPN [facility]

## 2011-05-17 NOTE — Telephone Encounter (Signed)
Pt informed to pup levemir samples. [name], LPN [facility]

## 2011-05-30 ENCOUNTER — Other Ambulatory Visit: Payer: Self-pay | Admitting: Family Medicine

## 2011-06-06 ENCOUNTER — Other Ambulatory Visit: Payer: Self-pay | Admitting: Family Medicine

## 2011-07-04 ENCOUNTER — Other Ambulatory Visit: Payer: Self-pay | Admitting: Family Medicine

## 2011-07-11 ENCOUNTER — Other Ambulatory Visit: Payer: Self-pay | Admitting: *Deleted

## 2011-07-11 MED ORDER — GLIPIZIDE 5 MG PO TABS: 5.0000 mg | ORAL_TABLET | Freq: Two times a day (BID) | ORAL | Status: DC

## 2011-07-11 MED ORDER — SIMVASTATIN 40 MG PO TABS: 40.0000 mg | ORAL_TABLET | Freq: Every day | ORAL | Status: DC

## 2011-07-11 MED ORDER — ATENOLOL 50 MG PO TABS: 50.0000 mg | ORAL_TABLET | Freq: Every day | ORAL | Status: DC

## 2011-07-31 ENCOUNTER — Other Ambulatory Visit: Payer: Self-pay | Admitting: Family Medicine

## 2011-08-03 ENCOUNTER — Other Ambulatory Visit: Payer: Self-pay | Admitting: Family Medicine

## 2011-09-02 ENCOUNTER — Other Ambulatory Visit: Payer: Self-pay | Admitting: Family Medicine

## 2011-09-29 ENCOUNTER — Ambulatory Visit: Payer: Self-pay | Admitting: Family Medicine

## 2011-09-30 ENCOUNTER — Ambulatory Visit: Payer: Self-pay | Admitting: Physician Assistant

## 2011-10-10 ENCOUNTER — Other Ambulatory Visit: Payer: Self-pay | Admitting: Family Medicine

## 2011-10-10 NOTE — Telephone Encounter (Signed)
Needs appointment

## 2011-11-09 ENCOUNTER — Other Ambulatory Visit: Payer: Self-pay | Admitting: Family Medicine

## 2011-11-14 ENCOUNTER — Ambulatory Visit (INDEPENDENT_AMBULATORY_CARE_PROVIDER_SITE_OTHER): Payer: Self-pay | Admitting: Family Medicine

## 2011-11-14 VITALS — BP 132/86 | HR 81 | Temp 98.4°F | Wt 209.0 lb

## 2011-11-14 DIAGNOSIS — J40 Bronchitis, not specified as acute or chronic: Secondary | ICD-10-CM

## 2011-11-14 DIAGNOSIS — J329 Chronic sinusitis, unspecified: Secondary | ICD-10-CM

## 2011-11-14 DIAGNOSIS — H6690 Otitis media, unspecified, unspecified ear: Secondary | ICD-10-CM

## 2011-11-14 MED ORDER — LISINOPRIL 40 MG PO TABS: 40.0000 mg | ORAL_TABLET | Freq: Every day | ORAL | Status: DC

## 2011-11-14 MED ORDER — HYDROCODONE-HOMATROPINE 5-1.5 MG/5ML PO SYRP: 5.0000 mL | ORAL_SOLUTION | Freq: Every evening | ORAL | Status: AC | PRN

## 2011-11-14 MED ORDER — ALPRAZOLAM 0.5 MG PO TABS: 0.5000 mg | ORAL_TABLET | Freq: Two times a day (BID) | ORAL | Status: DC | PRN

## 2011-11-14 MED ORDER — AMOXICILLIN-POT CLAVULANATE 875-125 MG PO TABS: 1.0000 | ORAL_TABLET | Freq: Two times a day (BID) | ORAL | Status: AC

## 2011-11-14 MED ORDER — GLIPIZIDE 5 MG PO TABS: 5.0000 mg | ORAL_TABLET | Freq: Two times a day (BID) | ORAL | Status: DC

## 2011-11-14 MED ORDER — ATENOLOL 50 MG PO TABS: 50.0000 mg | ORAL_TABLET | Freq: Two times a day (BID) | ORAL | Status: DC

## 2011-11-14 MED ORDER — CEFTRIAXONE SODIUM 1 G IJ SOLR
1.0000 g | Freq: Once | INTRAMUSCULAR | Status: AC
  Administered 2011-11-14: 1 g via INTRAMUSCULAR

## 2011-11-14 MED ORDER — AMLODIPINE BESYLATE 5 MG PO TABS: 5.0000 mg | ORAL_TABLET | Freq: Every day | ORAL | Status: DC

## 2011-11-14 NOTE — Patient Instructions (Addendum)
Follow up for diabetes.  Otitis Media, Adult A middle ear infection is an infection in the space behind the eardrum. The medical name for this is "otitis media." It may happen after a common cold. It is caused by a germ that starts growing in that space. You may feel swollen glands in your neck on the side of the ear infection. HOME CARE INSTRUCTIONS    Take your medicine as directed until it is gone, even if you feel better after the first few days.   Only take over-the-counter or prescription medicines for pain, discomfort, or fever as directed by your caregiver.   Occasional use of a nasal decongestant a couple times per day may help with discomfort and help the eustachian tube to drain better.  Follow up with your caregiver in 10 to 14 days or as directed, to be certain that the infection has cleared. Not keeping the appointment could result in a chronic or permanent injury, pain, hearing loss and disability. If there is any problem keeping the appointment, you must call back to this facility for assistance. SEEK IMMEDIATE MEDICAL CARE IF:    You are not getting better in 2 to 3 days.   You have pain that is not controlled with medication.   You feel worse instead of better.   You cannot use the medication as directed.   You develop swelling, redness or pain around the ear or stiffness in your neck.  MAKE SURE YOU:    Understand these instructions.   Will watch your condition.   Will get help right away if you are not doing well or get worse.  Document Released: 04/29/2004 Document Revised: 07/14/2011 Document Reviewed: 02/29/2008 [facility] Patient Information 2012 [place], [place].

## 2011-11-14 NOTE — Progress Notes (Signed)
  Subjective:    Patient ID: Erin [name], female    DOB: 08-12-1955, 56 y.o.   MRN: [id number]  HPI Sinus congestion and ST for 2 weeks.  Right ear pain for 3 days.  + fever.  Using some OTC ear drop. Using some mucinex and IBU for ear pain. + productive cough with green phlegm.  No nasal congestion.  Mild SOB.  + ST.     Review of Systems     Objective:   Physical Exam  Constitutional: She is oriented to person, place, and time. She appears well-developed and well-nourished.  HENT:  Head: Normocephalic and atraumatic.  Right Ear: External ear normal.  Left Ear: External ear normal.  Nose: Nose normal.  Mouth/Throat: Oropharynx is clear and moist.       Left TM and canal are clear. Right TM is erythematous and bulging. No exudate.  Eyes: Conjunctivae and EOM are normal. Pupils are equal, round, and reactive to light.  Neck: Neck supple. No thyromegaly present.  Cardiovascular: Normal rate, regular rhythm and normal heart sounds.   Pulmonary/Chest: Effort normal and breath sounds normal. She has no wheezes.  Lymphadenopathy:    She has no cervical adenopathy.  Neurological: She is alert and oriented to person, place, and time.  Skin: Skin is warm and dry.  Psychiatric: She has a normal mood and affect.          Assessment & Plan:  Right otitis media-blanket with Augmentin twice a day x10 days. She can use ibuprofen for pain relief. She asked if she could have an injection to help help her get better faster. Thus I gave her Rocephin IM 1 g. Call if not better in one week. She does not currently have health insurance but will hopefully get it in the next 60-90 days.  Bronchitis-she should hopefully improve on the Augmentin for her care. Call if continuing to have shortness of breath and cough and will get a chest x-ray.  Diabetes-asked her to make an appt as soon as she gets health insurance hopefully in the next 60-90 days. I did refill her medications until then.

## 2011-11-18 ENCOUNTER — Ambulatory Visit: Payer: Self-pay | Admitting: Physician Assistant

## 2011-12-08 ENCOUNTER — Other Ambulatory Visit: Payer: Self-pay | Admitting: Family Medicine

## 2011-12-12 ENCOUNTER — Other Ambulatory Visit: Payer: Self-pay | Admitting: Family Medicine

## 2011-12-27 ENCOUNTER — Other Ambulatory Visit: Payer: Self-pay | Admitting: Family Medicine

## 2012-01-09 ENCOUNTER — Other Ambulatory Visit: Payer: Self-pay | Admitting: Family Medicine

## 2012-01-11 ENCOUNTER — Other Ambulatory Visit: Payer: Self-pay | Admitting: *Deleted

## 2012-01-11 MED ORDER — ALPRAZOLAM 0.5 MG PO TABS: 0.5000 mg | ORAL_TABLET | Freq: Two times a day (BID) | ORAL | Status: DC | PRN

## 2012-02-06 ENCOUNTER — Other Ambulatory Visit: Payer: Self-pay | Admitting: Family Medicine

## 2012-02-13 ENCOUNTER — Other Ambulatory Visit: Payer: Self-pay | Admitting: Family Medicine

## 2012-03-14 ENCOUNTER — Other Ambulatory Visit: Payer: Self-pay | Admitting: Family Medicine

## 2012-04-03 ENCOUNTER — Encounter: Payer: Self-pay | Admitting: Family Medicine

## 2012-04-03 ENCOUNTER — Ambulatory Visit (INDEPENDENT_AMBULATORY_CARE_PROVIDER_SITE_OTHER): Payer: Self-pay | Admitting: Family Medicine

## 2012-04-03 VITALS — BP 135/89 | HR 88 | Temp 98.0°F | Ht 65.34 in | Wt 205.0 lb

## 2012-04-03 DIAGNOSIS — G4700 Insomnia, unspecified: Secondary | ICD-10-CM

## 2012-04-03 DIAGNOSIS — IMO0001 Reserved for inherently not codable concepts without codable children: Secondary | ICD-10-CM

## 2012-04-03 DIAGNOSIS — I10 Essential (primary) hypertension: Secondary | ICD-10-CM

## 2012-04-03 DIAGNOSIS — E119 Type 2 diabetes mellitus without complications: Secondary | ICD-10-CM

## 2012-04-03 DIAGNOSIS — F411 Generalized anxiety disorder: Secondary | ICD-10-CM

## 2012-04-03 MED ORDER — SIMVASTATIN 40 MG PO TABS: 40.0000 mg | ORAL_TABLET | Freq: Every day | ORAL | Status: DC

## 2012-04-03 MED ORDER — SERTRALINE HCL 50 MG PO TABS: ORAL_TABLET | ORAL | Status: DC

## 2012-04-03 MED ORDER — ATENOLOL 50 MG PO TABS: 50.0000 mg | ORAL_TABLET | Freq: Two times a day (BID) | ORAL | Status: DC

## 2012-04-03 NOTE — Progress Notes (Signed)
Subjective:    Patient ID: Erin [name], female    DOB: [birth year], 56 y.o.   MRN: [id number]  HPI Has been very anxious lately. Has been getting worse.  She feels ad and is off her insulin.  She is not sleeping well.  Says only gets oabout 4 hours of sleep in the xanax. Has been using them more frequently. She has a lot of external stressors but says she feels it's nothing to excessive. She says she feels it's nothing she can handle.  DM- Says not checking her sugars. She says her machine broke and she has not been able to do that. Her last diabetic followup was a year ago. She is off of insulin. She think she may need to restart his she's also noticed some numbness and tingling in her feet recently. She also says just in general she doesn't feel well physically.  Insomnia-she's not been sleeping well for months. She says she's only getting about 4 hours sleep on average and that if she takes her Xanax. She doesn't she sleeps less than that. She typically causes and turned. She's very anxious and has been worrying.   Review of Systems     Objective:   Physical Exam  Constitutional: She is oriented to person, place, and time. She appears well-developed and well-nourished.  HENT:  Head: Normocephalic and atraumatic.       Normal TM and canal on the right. No carotid bruit on the right.    Cardiovascular: Normal rate, regular rhythm and normal heart sounds.   Pulmonary/Chest: Effort normal and breath sounds normal.  Musculoskeletal: She exhibits no edema.  Neurological: She is alert and oriented to person, place, and time.  Skin: Skin is warm and dry.  Psychiatric: She has a normal mood and affect. Her behavior is normal.          Assessment & Plan:  GAD- GAD 7  Score of 21.  We discussed that she definitely needs to be on a controller for her medications she is Re: on the Xanax twice a day. Discuss how relation be used as needed and that it also helps her not build up a  tolerance to the medication. She tried Effexor several years ago but says it never really helped her so eventually she weaned off of it. I would like to start her on sertraline. We did discuss potential side effects I encouraged her to hang in there with it even if it does increase her anxiety the first couple weeks. I like to see her back in 3-4 weeks. I did refill her Xanax for twice a day as well.  DM- we gave her a new glucometer today. I did encourage her to check her sugars at least 2-3 times per weeks and we can have an idea what they're doing. I gave her samples of Lantus and encouraged her start with 20 units at bedtime. If her fastings are still over 150 then she can go ahead and go up to 30 units. Like her to stay there at least until she follows up in one month. We need to get her back on track as much as possible. For now she does not have insurance and this is a little bit difficult. I did encourage her to apply through the health system for some type of assistance. Lab Results  Component Value Date   HGBA1C >14 04/03/2012    HTN - at goal today. Refill her medications.  Insomnia-I would like to see  if this improves a little better the next 3-4 weeks as we are treating her anxiety. I believe this is the bigger contributor to her anxiety. It is not helping and consider a medication such as trazodone.

## 2012-04-03 NOTE — Patient Instructions (Addendum)
Recommend take your amlodipine at night and your lisinopril in the morning Use the atenolol twice a day.  Start Lantus 20 units at bedtime.   Check you sugar 2-3 times per week. If sugars over 150 in the AM then increase the Lantus to 30 units at bedtime.

## 2012-05-01 ENCOUNTER — Other Ambulatory Visit: Payer: Self-pay | Admitting: *Deleted

## 2012-05-01 MED ORDER — INSULIN PEN NEEDLE 29G X 12MM MISC: Status: DC

## 2012-05-01 MED ORDER — AMBULATORY NON FORMULARY MEDICATION: Status: DC

## 2012-05-09 ENCOUNTER — Other Ambulatory Visit: Payer: Self-pay | Admitting: Family Medicine

## 2012-05-28 ENCOUNTER — Telehealth: Payer: Self-pay

## 2012-05-28 NOTE — Telephone Encounter (Signed)
Patient will come by to pick up tomorrow.

## 2012-05-28 NOTE — Telephone Encounter (Signed)
Patient needs more Lantus samples. Is this ok to give?

## 2012-05-28 NOTE — Telephone Encounter (Signed)
Ok to give

## 2012-06-06 ENCOUNTER — Other Ambulatory Visit: Payer: Self-pay | Admitting: Family Medicine

## 2012-06-07 ENCOUNTER — Other Ambulatory Visit: Payer: Self-pay | Admitting: Family Medicine

## 2012-07-02 ENCOUNTER — Telehealth: Payer: Self-pay | Admitting: *Deleted

## 2012-07-02 NOTE — Telephone Encounter (Signed)
Pt on Levimir insulin and we have been giving her samples. We do not have any samples and she is completely out of insulin. Pt ask what to do? Is there anything we have here insulin wise she can use. Taking the Glipizide as well

## 2012-07-02 NOTE — Telephone Encounter (Signed)
Spoke with daughter and she states pt is on her to her house and she will have her to call to let us know when she can come by to pickup meds.

## 2012-07-02 NOTE — Telephone Encounter (Signed)
We can substitute lantus for the levemir if we have it. Can also give her samples of month or so fo onglyza 5mg  to take in addition to her glipizide.

## 2012-07-03 ENCOUNTER — Other Ambulatory Visit: Payer: Self-pay | Admitting: Family Medicine

## 2012-07-03 NOTE — Telephone Encounter (Signed)
Pt daughter notified and pt coming by today to pick up samples

## 2012-07-26 ENCOUNTER — Other Ambulatory Visit: Payer: Self-pay | Admitting: Family Medicine

## 2012-07-27 ENCOUNTER — Other Ambulatory Visit: Payer: Self-pay | Admitting: Family Medicine

## 2012-08-10 ENCOUNTER — Other Ambulatory Visit: Payer: Self-pay | Admitting: Family Medicine

## 2012-09-05 ENCOUNTER — Ambulatory Visit (INDEPENDENT_AMBULATORY_CARE_PROVIDER_SITE_OTHER): Payer: Self-pay | Admitting: Physician Assistant

## 2012-09-05 ENCOUNTER — Ambulatory Visit: Payer: Self-pay | Admitting: Family Medicine

## 2012-09-05 ENCOUNTER — Encounter: Payer: Self-pay | Admitting: Physician Assistant

## 2012-09-05 ENCOUNTER — Telehealth: Payer: Self-pay | Admitting: *Deleted

## 2012-09-05 VITALS — BP 144/97 | HR 76 | Wt 203.0 lb

## 2012-09-05 DIAGNOSIS — I10 Essential (primary) hypertension: Secondary | ICD-10-CM

## 2012-09-05 DIAGNOSIS — F411 Generalized anxiety disorder: Secondary | ICD-10-CM

## 2012-09-05 DIAGNOSIS — E785 Hyperlipidemia, unspecified: Secondary | ICD-10-CM

## 2012-09-05 DIAGNOSIS — E119 Type 2 diabetes mellitus without complications: Secondary | ICD-10-CM

## 2012-09-05 MED ORDER — AMBULATORY NON FORMULARY MEDICATION: Status: DC

## 2012-09-05 MED ORDER — ATENOLOL 50 MG PO TABS: 50.0000 mg | ORAL_TABLET | Freq: Two times a day (BID) | ORAL | Status: DC

## 2012-09-05 MED ORDER — GLIPIZIDE 5 MG PO TABS: 5.0000 mg | ORAL_TABLET | Freq: Two times a day (BID) | ORAL | Status: DC

## 2012-09-05 MED ORDER — SIMVASTATIN 40 MG PO TABS: 40.0000 mg | ORAL_TABLET | Freq: Every day | ORAL | Status: DC

## 2012-09-05 MED ORDER — LISINOPRIL 40 MG PO TABS: 40.0000 mg | ORAL_TABLET | Freq: Every day | ORAL | Status: DC

## 2012-09-05 MED ORDER — ALPRAZOLAM 0.5 MG PO TABS: 0.5000 mg | ORAL_TABLET | Freq: Two times a day (BID) | ORAL | Status: DC

## 2012-09-05 MED ORDER — AMLODIPINE BESYLATE 5 MG PO TABS: 5.0000 mg | ORAL_TABLET | Freq: Every day | ORAL | Status: DC

## 2012-09-05 NOTE — Patient Instructions (Addendum)
[facility].   Increase lantus to 30units at bedtime. If check fasting and greater than 120. Increase by 2 units until you get to 120. Then only increase by 1 unit. If you get under 120 then need appt.

## 2012-09-05 NOTE — Progress Notes (Signed)
Subjective:    Patient ID: Erin [name], female    DOB: [birth year], 57 y.o.   MRN: [id number]  Diabetes She presents for her follow-up diabetic visit. She has type 2 diabetes mellitus. No MedicAlert identification noted. Her disease course has been improving. Pertinent negatives for hypoglycemia include no headaches or sweats. (ONce in a while if she does not eat enough at meal time and takes glipizide she will feel sweaty and like her heart is racing. Usually about once or twice a month.) Associated symptoms include fatigue and weakness. Pertinent negatives for diabetes include no blurred vision, no chest pain, no foot paresthesias, no foot ulcerations, no polydipsia, no polyphagia, no polyuria, no visual change and no weight loss. There are no hypoglycemic complications. Symptoms are improving. There are no diabetic complications. Risk factors for coronary artery disease include stress, sedentary lifestyle, dyslipidemia, diabetes mellitus, hypertension and obesity. Current diabetic treatment includes insulin injections and oral agent (monotherapy). She is compliant with treatment all of the time. She is currently taking insulin at bedtime (20 units ). Insulin injections are given by patient. Rotation sites for injection include the abdominal wall. Her weight is stable. She is following a generally unhealthy diet. Meal planning includes avoidance of concentrated sweets. She has not had a previous visit with a dietician. She never participates in exercise. Frequency home blood tests: not checking because cannot afford test strips. There is no compliance with monitoring of blood glucose. An ACE inhibitor/angiotensin II receptor blocker is being taken. She does not see a podiatrist.Eye exam is not current.  Hypertension This is a chronic problem. The current episode started more than 1 year ago. The problem is unchanged. The problem is controlled. Associated symptoms include anxiety and malaise/fatigue.  Pertinent negatives include no blurred vision, chest pain, headaches, neck pain, orthopnea, palpitations, peripheral edema, PND, shortness of breath or sweats. Risk factors for coronary artery disease include diabetes mellitus, dyslipidemia, family history, obesity, sedentary lifestyle and stress. Past treatments include ACE inhibitors, beta blockers and calcium channel blockers. The current treatment provides significant improvement. There are no compliance problems.     Hyperlipidemia has not been checked in a while. Stays on Zocor. No assoicated or alleviating factors. Denies exercise or diet changes.   Anxiety controlled on Xanax needs refill.  Review of Systems  Constitutional: Positive for malaise/fatigue and fatigue. Negative for weight loss.  HENT: Negative for neck pain.   Eyes: Negative for blurred vision.  Respiratory: Negative for shortness of breath.   Cardiovascular: Negative for chest pain, palpitations, orthopnea and PND.  Genitourinary: Negative for polyuria.  Neurological: Positive for weakness. Negative for headaches.  Hematological: Negative for polydipsia and polyphagia.       Objective:   Physical Exam  Constitutional: She is oriented to person, place, and time. She appears well-developed and well-nourished.  HENT:  Head: Normocephalic and atraumatic.  Cardiovascular: Normal rate, regular rhythm and normal heart sounds.   Pulmonary/Chest: Effort normal and breath sounds normal.  Neurological: She is alert and oriented to person, place, and time.  Skin: Skin is warm and dry.  Psychiatric: She has a normal mood and affect. Her behavior is normal.          Assessment & Plan:  Diabetes Mellitus- A1C is 12.2. Encouraged because has come down under 14 however still out of control and along ways to go. Would like to start short acting insulin at largest meal but that would be yet another medication that patient cannot afford. She is  not even checking her sugars  regularly because cannot afford test strips. I am concerned if I gave her short acting she would not be able to dose up correctly. Will keep on Lantus but increase to 30 and then if checking fasting adjust by 2 units until at 120. I also refilled Glipizide at meal time. Would like to sign up for diabetes free class to talk about diabetes. Wrote for cheaper meter and strips at [facility] to hopefully make affordable to start checking sugars. We also gave her paperwork to see if Lantus company would pay for meds. She is responsible for returning. Declined any bloodwork today due to cost. Has not had eye exam. Will not check mircoalbumin since A1C out of control will be high.   Anxiety NOS- Refilled Xanax. Encouraged to use as needed only.  Hypertension-refilled Norvasc, atenolol, and lisinopril today. BP was elevated but she had ran out of Norvasc. Reminded patient of low salt diet. Recheck in 3 months.  Hyperlipidemia-patient declined to get lipid level checked today due to no insurance. We'll refill Zocor for 6 months. Discussed with patient need for recheck at some point.  Had a long discussion with patient over care. I think she should call [facility] to discuss options for free clinic where she could have her diabetic and hypertensive needs met. Gave her information today.

## 2012-09-05 NOTE — Telephone Encounter (Signed)
Pt calls & states that you forgot to send her xanax in this morning

## 2012-09-05 NOTE — Telephone Encounter (Signed)
Done

## 2012-09-12 ENCOUNTER — Ambulatory Visit: Payer: Self-pay | Admitting: Family Medicine

## 2012-09-12 ENCOUNTER — Telehealth: Payer: Self-pay | Admitting: Physician Assistant

## 2012-09-12 NOTE — Telephone Encounter (Signed)
Will you call patient and see if she would want to be put on a free list to come if for diabetic education? If so tell jennifer and when we get 4 people will call to schedule.

## 2012-09-12 NOTE — Telephone Encounter (Signed)
Pt agrees to come in for the class

## 2012-09-13 ENCOUNTER — Ambulatory Visit: Payer: Self-pay | Admitting: Family Medicine

## 2012-09-13 DIAGNOSIS — Z0289 Encounter for other administrative examinations: Secondary | ICD-10-CM

## 2012-10-08 ENCOUNTER — Other Ambulatory Visit: Payer: Self-pay | Admitting: Physician Assistant

## 2012-11-02 ENCOUNTER — Other Ambulatory Visit: Payer: Self-pay | Admitting: Physician Assistant

## 2012-11-05 ENCOUNTER — Other Ambulatory Visit: Payer: Self-pay | Admitting: Physician Assistant

## 2012-11-05 NOTE — Telephone Encounter (Signed)
Ok to fill 

## 2012-12-02 ENCOUNTER — Other Ambulatory Visit: Payer: Self-pay | Admitting: Family Medicine

## 2012-12-06 ENCOUNTER — Other Ambulatory Visit: Payer: Self-pay | Admitting: Family Medicine

## 2013-01-06 ENCOUNTER — Other Ambulatory Visit: Payer: Self-pay | Admitting: Family Medicine

## 2013-01-07 ENCOUNTER — Other Ambulatory Visit: Payer: Self-pay | Admitting: Family Medicine

## 2013-01-11 ENCOUNTER — Ambulatory Visit: Payer: Self-pay | Admitting: Family Medicine

## 2013-01-15 ENCOUNTER — Telehealth: Payer: Self-pay | Admitting: *Deleted

## 2013-01-15 MED ORDER — INSULIN GLARGINE 100 UNIT/ML ~~LOC~~ SOLN: SUBCUTANEOUS | Status: DC

## 2013-01-15 NOTE — Telephone Encounter (Signed)
Pt calls and request a sample of Lantus insulin.  Pt given 2 boxes of Lantus pen. Barry Dienes, LPN

## 2013-01-30 ENCOUNTER — Other Ambulatory Visit: Payer: Self-pay | Admitting: Family Medicine

## 2013-02-04 ENCOUNTER — Other Ambulatory Visit: Payer: Self-pay | Admitting: Family Medicine

## 2013-02-14 ENCOUNTER — Other Ambulatory Visit: Payer: Self-pay

## 2013-03-05 ENCOUNTER — Other Ambulatory Visit: Payer: Self-pay | Admitting: Family Medicine

## 2013-03-11 ENCOUNTER — Other Ambulatory Visit: Payer: Self-pay | Admitting: Family Medicine

## 2013-03-12 ENCOUNTER — Other Ambulatory Visit: Payer: Self-pay | Admitting: Family Medicine

## 2013-03-12 ENCOUNTER — Telehealth: Payer: Self-pay | Admitting: *Deleted

## 2013-03-12 NOTE — Telephone Encounter (Signed)
Pt left a message asking for a refill on her xanax.  You saw her in Jan & encouraged her to use PRN only. The last refill was 6-30.  Would you like her to make f/u appt? Please advise

## 2013-03-13 NOTE — Telephone Encounter (Signed)
Yes, plus she is diabetic and really needs a followup appointment.

## 2013-03-13 NOTE — Telephone Encounter (Signed)
No

## 2013-03-13 NOTE — Telephone Encounter (Signed)
Pt.notified

## 2013-03-13 NOTE — Telephone Encounter (Signed)
Spoke with pt & informed her that she really needs a f/u. She asked if you would send her in some until she comes home Sunday, [place] the beach.

## 2013-03-19 ENCOUNTER — Ambulatory Visit (INDEPENDENT_AMBULATORY_CARE_PROVIDER_SITE_OTHER): Payer: Self-pay | Admitting: Family Medicine

## 2013-03-19 ENCOUNTER — Encounter: Payer: Self-pay | Admitting: Family Medicine

## 2013-03-19 VITALS — BP 130/80 | HR 84 | Wt 207.0 lb

## 2013-03-19 DIAGNOSIS — F411 Generalized anxiety disorder: Secondary | ICD-10-CM

## 2013-03-19 DIAGNOSIS — I10 Essential (primary) hypertension: Secondary | ICD-10-CM

## 2013-03-19 DIAGNOSIS — E119 Type 2 diabetes mellitus without complications: Secondary | ICD-10-CM

## 2013-03-19 LAB — POCT GLYCOSYLATED HEMOGLOBIN (HGB A1C): Hemoglobin A1C: 14

## 2013-03-19 MED ORDER — LISINOPRIL 40 MG PO TABS: 40.0000 mg | ORAL_TABLET | Freq: Every day | ORAL | Status: DC

## 2013-03-19 MED ORDER — ALPRAZOLAM 0.5 MG PO TABS: ORAL_TABLET | ORAL | Status: DC

## 2013-03-19 MED ORDER — AMLODIPINE BESYLATE 5 MG PO TABS: 5.0000 mg | ORAL_TABLET | Freq: Every day | ORAL | Status: DC

## 2013-03-19 MED ORDER — CANAGLIFLOZIN 100 MG PO TABS: 300.0000 mg | ORAL_TABLET | Freq: Every day | ORAL | Status: DC

## 2013-03-19 MED ORDER — SIMVASTATIN 40 MG PO TABS: 40.0000 mg | ORAL_TABLET | Freq: Every day | ORAL | Status: DC

## 2013-03-19 MED ORDER — ATENOLOL 50 MG PO TABS: 50.0000 mg | ORAL_TABLET | Freq: Two times a day (BID) | ORAL | Status: DC

## 2013-03-19 NOTE — Progress Notes (Signed)
  Subjective:    Patient ID: Erin [name], female    DOB: 1956/05/12, 57 y.o.   MRN: [id number]  HPI DM- she was last seen 6 months ago. she is out of lantus x 1 mo  and has no health insurance. He is working and to try to apply for assistance but they said she made much money. She will be able to get insurance but will not start until January. She was having might have something to samples to give her today. Right now she's only taking glipizide. She says she's been trying very hard to watch what she eats. No regular exercise. She fell and actually twisted her left knee. She did go to the emergency department and they did do x-rays and told her that it was just a soft tissue sprain and contusion. It still feels sore but it is getting better. She has also been out of her statin for about a month.  HTN -  Pt denies chest pain, SOB, dizziness, or heart palpitations.  Taking meds as directed w/o problems.  Denies medication side effects.  She has been able to afford the atenolol amlodipine and has been taking them regularly.  Generalized anxiety disorder-she's been out of her Xanax for about 2 weeks and says she feels like she is about to go nuts. She would like a refill on that today. Review of Systems     Objective:   Physical Exam  Constitutional: She is oriented to person, place, and time. She appears well-developed and well-nourished.  HENT:  Head: Normocephalic and atraumatic.  Cardiovascular: Normal rate, regular rhythm and normal heart sounds.   Pulmonary/Chest: Effort normal and breath sounds normal.  Neurological: She is alert and oriented to person, place, and time.  Skin: Skin is warm and dry.  Psychiatric: She has a normal mood and affect. Her behavior is normal.          Assessment & Plan:  DM- uncontrolled. Unfortunately financial situation has been very difficult for her to be able to afford medications. I'm going to put her on a new medication called Invokana. We did  discuss the risks and benefits of this medication including increased risk of urinary tract infections and yeast infections. I gave her a coupon card that should make this a portable and hopefully free for at least a year. Samples were given today. Start with 100 mg daily for 15 days and then increase to 300 mg daily. She may be some increased urination with this product. Also gave her 2 sample pen the Lantus today. She is still using 30 units when she is able to get the Lantus. I asked her followup in January when she gets her new insurance. Clearly I would like to see her back much sooner since she is uncontrolled but I do not think he would really change the situation as far as what medication she is able to afford or take until she is able to get some assistance. Foot Exam and urine microalbumin performed today. Encouraged her to get an eye exam this likely will not happen until she's able to get insurance. We did refill her medications today.  HTN- well controlled. Continue current regimen.  Generalized anxiety disorder-did refill her Xanax today.

## 2013-03-22 ENCOUNTER — Telehealth: Payer: Self-pay | Admitting: *Deleted

## 2013-03-22 NOTE — Telephone Encounter (Signed)
Pt calls and states that she forgot to tell you at her appt the other day that she has been having a lot of cramping in her feet, legs and back and wants to know what she can do about it. Barry Dienes, LPN

## 2013-03-22 NOTE — Telephone Encounter (Signed)
Pt notified of MD instrucrtions. Barry Dienes, LPN

## 2013-03-22 NOTE — Telephone Encounter (Signed)
Recommend a daily B complex (make sure has B12 adn B6 in ). Studies showing helps with cramping. Could be coming from electrolyte imbalance as well but I do not have any up-to-date blood work on her. She's been without insurance and has not been able to afford to go.

## 2013-03-26 ENCOUNTER — Ambulatory Visit: Payer: Self-pay | Admitting: Family Medicine

## 2013-04-04 ENCOUNTER — Telehealth: Payer: Self-pay | Admitting: *Deleted

## 2013-04-04 MED ORDER — FLUCONAZOLE 150 MG PO TABS: 150.0000 mg | ORAL_TABLET | Freq: Once | ORAL | Status: DC

## 2013-04-04 NOTE — Telephone Encounter (Signed)
I will call over treatment so try to continue the medication.

## 2013-04-04 NOTE — Telephone Encounter (Signed)
Pt calls and states she has a horrible yeast infection since she started the Invokana.  She wants to know what she should do?  Continue the med or stop it

## 2013-04-05 NOTE — Telephone Encounter (Signed)
Pt notified to continue Invokana and pick up med from pharmacy. Barry Dienes, LPN

## 2013-04-12 ENCOUNTER — Encounter: Payer: Self-pay | Admitting: Physician Assistant

## 2013-04-12 ENCOUNTER — Ambulatory Visit (INDEPENDENT_AMBULATORY_CARE_PROVIDER_SITE_OTHER): Payer: Self-pay | Admitting: Physician Assistant

## 2013-04-12 VITALS — BP 128/93 | HR 81 | Temp 98.5°F | Wt 205.0 lb

## 2013-04-12 DIAGNOSIS — J019 Acute sinusitis, unspecified: Secondary | ICD-10-CM

## 2013-04-12 DIAGNOSIS — J209 Acute bronchitis, unspecified: Secondary | ICD-10-CM

## 2013-04-12 MED ORDER — PREDNISONE 50 MG PO TABS: ORAL_TABLET | ORAL | Status: DC

## 2013-04-12 MED ORDER — AZITHROMYCIN 250 MG PO TABS: ORAL_TABLET | ORAL | Status: DC

## 2013-04-12 NOTE — Patient Instructions (Addendum)

## 2013-04-12 NOTE — Progress Notes (Signed)
  Subjective:    Patient ID: Erin [name], female    DOB: 10-12-1955, 57 y.o.   MRN: [id number]  HPI Patient presents to the clinic with cough and sinus pressure. She went to ER in [place] 3 weeks ago and was diagnosed with bronchitis and given abx but unsure what is was. She felt much better and then seemed ot start getting worse and worse. Pt does not currently smoke since she quit in 2005. Cough is productive with yellowish sputum. She feels very wheezy and SOB. She denies a fever or chills. She does feel nauseated. She does not have any know allergies or asthma diagnoses.     Review of Systems     Objective:   Physical Exam  Constitutional: She appears well-developed and well-nourished.  HENT:  Head: Normocephalic and atraumatic.  Right Ear: External ear normal.  Left Ear: External ear normal.  Nose: Nose normal.  Mouth/Throat: Oropharynx is clear and moist. No oropharyngeal exudate.  Tenderness to palpation over maxillary sinuses bilaterally.   Eyes: Conjunctivae are normal. Right eye exhibits no discharge. Left eye exhibits no discharge.  Neck: Normal range of motion. Neck supple.  Cardiovascular: Normal rate, regular rhythm and normal heart sounds.   Pulmonary/Chest: Effort normal.  Normal effort. Wheezing at base of both lungs. Cough productive.   Lymphadenopathy:    She has no cervical adenopathy.  Skin: Skin is warm and dry.  Psychiatric: She has a normal mood and affect. Her behavior is normal.          Assessment & Plan:  Bronchitis/sinusitis- I will send zpak since likely ER gave her Augmentin. I would like to give pt albuterol inhaler but pt cannot afford due to no insurance. Did also give prednisone for 5 days. If pt not feeling better or suddenly worse needs to call office.

## 2013-04-19 ENCOUNTER — Telehealth: Payer: Self-pay | Admitting: *Deleted

## 2013-04-19 MED ORDER — HYDROCODONE-HOMATROPINE 5-1.5 MG/5ML PO SYRP: 5.0000 mL | ORAL_SOLUTION | Freq: Every evening | ORAL | Status: DC | PRN

## 2013-04-19 NOTE — Telephone Encounter (Signed)
I will send over a prescription for cough medicine.

## 2013-04-19 NOTE — Telephone Encounter (Signed)
Pt states she still has the cough from the bronchitis and would like to know if you will send her over some cough syrup. She states that is the only thing that helps with the cough.  Meyer Cory, LPN

## 2013-04-19 NOTE — Telephone Encounter (Signed)
LMOM with response.  Misty Ahmad, LPN  

## 2013-05-15 ENCOUNTER — Ambulatory Visit: Payer: Self-pay | Admitting: Family Medicine

## 2013-05-31 ENCOUNTER — Ambulatory Visit (INDEPENDENT_AMBULATORY_CARE_PROVIDER_SITE_OTHER): Payer: Self-pay | Admitting: Physician Assistant

## 2013-05-31 ENCOUNTER — Encounter: Payer: Self-pay | Admitting: Physician Assistant

## 2013-05-31 ENCOUNTER — Ambulatory Visit (INDEPENDENT_AMBULATORY_CARE_PROVIDER_SITE_OTHER): Payer: Self-pay

## 2013-05-31 VITALS — BP 178/110 | HR 75 | Wt 206.0 lb

## 2013-05-31 DIAGNOSIS — M5030 Other cervical disc degeneration, unspecified cervical region: Secondary | ICD-10-CM

## 2013-05-31 DIAGNOSIS — I672 Cerebral atherosclerosis: Secondary | ICD-10-CM

## 2013-05-31 DIAGNOSIS — I10 Essential (primary) hypertension: Secondary | ICD-10-CM

## 2013-05-31 DIAGNOSIS — N644 Mastodynia: Secondary | ICD-10-CM

## 2013-05-31 DIAGNOSIS — M542 Cervicalgia: Secondary | ICD-10-CM

## 2013-05-31 DIAGNOSIS — G629 Polyneuropathy, unspecified: Secondary | ICD-10-CM

## 2013-05-31 DIAGNOSIS — R209 Unspecified disturbances of skin sensation: Secondary | ICD-10-CM

## 2013-05-31 DIAGNOSIS — G589 Mononeuropathy, unspecified: Secondary | ICD-10-CM

## 2013-05-31 DIAGNOSIS — M5090 Cervical disc disorder, unspecified, unspecified cervical region: Secondary | ICD-10-CM

## 2013-05-31 DIAGNOSIS — R030 Elevated blood-pressure reading, without diagnosis of hypertension: Secondary | ICD-10-CM

## 2013-05-31 DIAGNOSIS — R51 Headache: Secondary | ICD-10-CM

## 2013-05-31 LAB — CBC WITH DIFFERENTIAL/PLATELET
Basophils Absolute: 0 10*3/uL (ref 0.0–0.1)
Eosinophils Absolute: 0.2 10*3/uL (ref 0.0–0.7)
Eosinophils Relative: 3 % (ref 0–5)
HCT: 43.1 % (ref 36.0–46.0)
Lymphocytes Relative: 45 % (ref 12–46)
MCH: 30.8 pg (ref 26.0–34.0)
MCHC: 35.5 g/dL (ref 30.0–36.0)
MCV: 86.7 fL (ref 78.0–100.0)
Monocytes Absolute: 0.5 10*3/uL (ref 0.1–1.0)
RDW: 14 % (ref 11.5–15.5)
WBC: 7.1 10*3/uL (ref 4.0–10.5)

## 2013-05-31 MED ORDER — CYCLOBENZAPRINE HCL 10 MG PO TABS: 10.0000 mg | ORAL_TABLET | Freq: Three times a day (TID) | ORAL | Status: DC | PRN

## 2013-05-31 MED ORDER — TRAMADOL HCL 50 MG PO TABS: 50.0000 mg | ORAL_TABLET | Freq: Four times a day (QID) | ORAL | Status: DC | PRN

## 2013-05-31 MED ORDER — PREDNISONE 50 MG PO TABS: ORAL_TABLET | ORAL | Status: DC

## 2013-05-31 NOTE — Patient Instructions (Addendum)
Continue with warm compresses alternated with cold.   Don't take prednisone until check BP and lower.   Cervical Strain and Sprain (Whiplash) with Rehab Cervical strain and sprains are injuries that commonly occur with "whiplash" injuries. Whiplash occurs when the neck is forcefully whipped backward or forward, such as during a motor vehicle accident. The muscles, ligaments, tendons, discs and nerves of the neck are susceptible to injury when this occurs. SYMPTOMS   Pain or stiffness in the front and/or back of neck  Symptoms may present immediately or up to 24 hours after injury.  Dizziness, headache, nausea and vomiting.  Muscle spasm with soreness and stiffness in the neck.  Tenderness and swelling at the injury site. CAUSES  Whiplash injuries often occur during contact sports or motor vehicle accidents.  RISK INCREASES WITH:  Osteoarthritis of the spine.  Situations that make head or neck accidents or trauma more likely.  High-risk sports (football, rugby, wrestling, hockey, auto racing, gymnastics, diving, contact karate or boxing).  Poor strength and flexibility of the neck.  Previous neck injury.  Poor tackling technique.  Improperly fitted or padded equipment. PREVENTION  Learn and use proper technique (avoid tackling with the head, spearing and head-butting; use proper falling techniques to avoid landing on the head).  Warm up and stretch properly before activity.  Maintain physical fitness:  Strength, flexibility and endurance.  Cardiovascular fitness.  Wear properly fitted and padded protective equipment, such as padded soft collars, for participation in contact sports. PROGNOSIS  Recovery for cervical strain and sprain injuries is dependent on the extent of the injury. These injuries are usually curable in 1 week to 3 months with appropriate treatment.  RELATED COMPLICATIONS   Temporary numbness and weakness may occur if the nerve roots are damaged, and  this may persist until the nerve has completely healed.  Chronic pain due to frequent recurrence of symptoms.  Prolonged healing, especially if activity is resumed too soon (before complete recovery). TREATMENT  Treatment initially involves the use of ice and medication to help reduce pain and inflammation. It is also important to perform strengthening and stretching exercises and modify activities that worsen symptoms so the injury does not get worse. These exercises may be performed at home or with a therapist. For patients who experience severe symptoms, a soft padded collar may be recommended to be worn around the neck.  Improving your posture may help reduce symptoms. Posture improvement includes pulling your chin and abdomen in while sitting or standing. If you are sitting, sit in a firm chair with your buttocks against the back of the chair. While sleeping, try replacing your pillow with a small towel rolled to 2 inches in diameter, or use a cervical pillow or soft cervical collar. Poor sleeping positions delay healing.  For patients with nerve root damage, which causes numbness or weakness, the use of a cervical traction apparatus may be recommended. Surgery is rarely necessary for these injuries. However, cervical strain and sprains that are present at birth (congenital) may require surgery. MEDICATION   If pain medication is necessary, nonsteroidal anti-inflammatory medications, such as aspirin and ibuprofen, or other minor pain relievers, such as acetaminophen, are often recommended.  Do not take pain medication for 7 days before surgery.  Prescription pain relievers may be given if deemed necessary by your caregiver. Use only as directed and only as much as you need. HEAT AND COLD:   Cold treatment (icing) relieves pain and reduces inflammation. Cold treatment should be applied for  10 to 15 minutes every 2 to 3 hours for inflammation and pain and immediately after any activity that  aggravates your symptoms. Use ice packs or an ice massage.  Heat treatment may be used prior to performing the stretching and strengthening activities prescribed by your caregiver, physical therapist, or athletic trainer. Use a heat pack or a warm soak. SEEK MEDICAL CARE IF:   Symptoms get worse or do not improve in 2 weeks despite treatment.  New, unexplained symptoms develop (drugs used in treatment may produce side effects). EXERCISES RANGE OF MOTION (ROM) AND STRETCHING EXERCISES - Cervical Strain and Sprain These exercises may help you when beginning to rehabilitate your injury. In order to successfully resolve your symptoms, you must improve your posture. These exercises are designed to help reduce the forward-head and rounded-shoulder posture which contributes to this condition. Your symptoms may resolve with or without further involvement from your physician, physical therapist or athletic trainer. While completing these exercises, remember:   Restoring tissue flexibility helps normal motion to return to the joints. This allows healthier, less painful movement and activity.  An effective stretch should be held for at least 20 seconds, although you may need to begin with shorter hold times for comfort.  A stretch should never be painful. You should only feel a gentle lengthening or release in the stretched tissue. STRETCH- Axial Extensors  Lie on your back on the floor. You may bend your knees for comfort. Place a rolled up hand towel or dish towel, about 2 inches in diameter, under the part of your head that makes contact with the floor.  Gently tuck your chin, as if trying to make a "double chin," until you feel a gentle stretch at the base of your head.  Hold __________ seconds. Repeat __________ times. Complete this exercise __________ times per day.  STRETECH - Axial Extension   Stand or sit on a firm surface. Assume a good posture: chest up, shoulders drawn back, abdominal  muscles slightly tense, knees unlocked (if standing) and feet hip width apart.  Slowly retract your chin so your head slides back and your chin slightly lowers.Continue to look straight ahead.  You should feel a gentle stretch in the back of your head. Be certain not to feel an aggressive stretch since this can cause headaches later.  Hold for __________ seconds. Repeat __________ times. Complete this exercise __________ times per day. STRETCH  Cervical Side Bend   Stand or sit on a firm surface. Assume a good posture: chest up, shoulders drawn back, abdominal muscles slightly tense, knees unlocked (if standing) and feet hip width apart.  Without letting your nose or shoulders move, slowly tip your right / left ear to your shoulder until your feel a gentle stretch in the muscles on the opposite side of your neck.  Hold __________ seconds. Repeat __________ times. Complete this exercise __________ times per day. STRETCH  Cervical Rotators   Stand or sit on a firm surface. Assume a good posture: chest up, shoulders drawn back, abdominal muscles slightly tense, knees unlocked (if standing) and feet hip width apart.  Keeping your eyes level with the ground, slowly turn your head until you feel a gentle stretch along the back and opposite side of your neck.  Hold __________ seconds. Repeat __________ times. Complete this exercise __________ times per day. RANGE OF MOTION - Neck Circles   Stand or sit on a firm surface. Assume a good posture: chest up, shoulders drawn back, abdominal muscles slightly  tense, knees unlocked (if standing) and feet hip width apart.  Gently roll your head down and around from the back of one shoulder to the back of the other. The motion should never be forced or painful.  Repeat the motion 10-20 times, or until you feel the neck muscles relax and loosen. Repeat __________ times. Complete the exercise __________ times per day. STRENGTHENING EXERCISES - Cervical  Strain and Sprain These exercises may help you when beginning to rehabilitate your injury. They may resolve your symptoms with or without further involvement from your physician, physical therapist or athletic trainer. While completing these exercises, remember:   Muscles can gain both the endurance and the strength needed for everyday activities through controlled exercises.  Complete these exercises as instructed by your physician, physical therapist or athletic trainer. Progress the resistance and repetitions only as guided.  You may experience muscle soreness or fatigue, but the pain or discomfort you are trying to eliminate should never worsen during these exercises. If this pain does worsen, stop and make certain you are following the directions exactly. If the pain is still present after adjustments, discontinue the exercise until you can discuss the trouble with your clinician. STRENGTH Cervical Flexors, Isometric  Face a wall, standing about 6 inches away. Place a small pillow, a ball about 6-8 inches in diameter, or a folded towel between your forehead and the wall.  Slightly tuck your chin and gently push your forehead into the soft object. Push only with mild to moderate intensity, building up tension gradually. Keep your jaw and forehead relaxed.  Hold 10 to 20 seconds. Keep your breathing relaxed.  Release the tension slowly. Relax your neck muscles completely before you start the next repetition. Repeat __________ times. Complete this exercise __________ times per day. STRENGTH- Cervical Lateral Flexors, Isometric   Stand about 6 inches away from a wall. Place a small pillow, a ball about 6-8 inches in diameter, or a folded towel between the side of your head and the wall.  Slightly tuck your chin and gently tilt your head into the soft object. Push only with mild to moderate intensity, building up tension gradually. Keep your jaw and forehead relaxed.  Hold 10 to 20 seconds.  Keep your breathing relaxed.  Release the tension slowly. Relax your neck muscles completely before you start the next repetition. Repeat __________ times. Complete this exercise __________ times per day. STRENGTH  Cervical Extensors, Isometric   Stand about 6 inches away from a wall. Place a small pillow, a ball about 6-8 inches in diameter, or a folded towel between the back of your head and the wall.  Slightly tuck your chin and gently tilt your head back into the soft object. Push only with mild to moderate intensity, building up tension gradually. Keep your jaw and forehead relaxed.  Hold 10 to 20 seconds. Keep your breathing relaxed.  Release the tension slowly. Relax your neck muscles completely before you start the next repetition. Repeat __________ times. Complete this exercise __________ times per day. POSTURE AND BODY MECHANICS CONSIDERATIONS - Cervical Strain and Sprain Keeping correct posture when sitting, standing or completing your activities will reduce the stress put on different body tissues, allowing injured tissues a chance to heal and limiting painful experiences. The following are general guidelines for improved posture. Your physician or physical therapist will provide you with any instructions specific to your needs. While reading these guidelines, remember:  The exercises prescribed by your provider will help you have the  flexibility and strength to maintain correct postures.  The correct posture provides the optimal environment for your joints to work. All of your joints have less wear and tear when properly supported by a spine with good posture. This means you will experience a healthier, less painful body.  Correct posture must be practiced with all of your activities, especially prolonged sitting and standing. Correct posture is as important when doing repetitive low-stress activities (typing) as it is when doing a single heavy-load activity (lifting). PROLONGED  STANDING WHILE SLIGHTLY LEANING FORWARD When completing a task that requires you to lean forward while standing in one place for a long time, place either foot up on a stationary 2-4 inch high object to help maintain the best posture. When both feet are on the ground, the low back tends to lose its slight inward curve. If this curve flattens (or becomes too large), then the back and your other joints will experience too much stress, fatigue more quickly and can cause pain.  RESTING POSITIONS Consider which positions are most painful for you when choosing a resting position. If you have pain with flexion-based activities (sitting, bending, stooping, squatting), choose a position that allows you to rest in a less flexed posture. You would want to avoid curling into a fetal position on your side. If your pain worsens with extension-based activities (prolonged standing, working overhead), avoid resting in an extended position such as sleeping on your stomach. Most people will find more comfort when they rest with their spine in a more neutral position, neither too rounded nor too arched. Lying on a non-sagging bed on your side with a pillow between your knees, or on your back with a pillow under your knees will often provide some relief. Keep in mind, being in any one position for a prolonged period of time, no matter how correct your posture, can still lead to stiffness. WALKING Walk with an upright posture. Your ears, shoulders and hips should all line-up. OFFICE WORK When working at a desk, create an environment that supports good, upright posture. Without extra support, muscles fatigue and lead to excessive strain on joints and other tissues. CHAIR:  A chair should be able to slide under your desk when your back makes contact with the back of the chair. This allows you to work closely.  The chair's height should allow your eyes to be level with the upper part of your monitor and your hands to be slightly  lower than your elbows.  Body position:  Your feet should make contact with the floor. If this is not possible, use a foot rest.  Keep your ears over your shoulders. This will reduce stress on your neck and low back. Document Released: 07/25/2005 Document Revised: 10/17/2011 Document Reviewed: 11/06/2008 [facility] Patient Information 2014 [place], [place].

## 2013-05-31 NOTE — Progress Notes (Signed)
  Subjective:    Patient ID: Erin [name], female    DOB: 1956-01-26, 57 y.o.   MRN: [id number]  HPI Patient presents to the clinic with headache that will not stop. For the last week her HA has been on and off. Denies any sinus pressure, ear pain or ST. NO fever, chills, n/v/d, SOB, wheezing, cough. Difficult to move head in any direction. Pain radiates from neck up into head and behind ear. In the last couple of days she has also had some breast numbness and tingling. Taking tylenol for aches and pain  Pt has not taken any BP medications for last 2 days. Denies any CP, palpitations, vision changes.    Review of Systems     Objective:   Physical Exam  Constitutional: She is oriented to person, place, and time. She appears well-developed and well-nourished.  HENT:  Head: Normocephalic and atraumatic.  Right Ear: External ear normal.  Left Ear: External ear normal.  Nose: Nose normal.  Mouth/Throat: Oropharynx is clear and moist.  Eyes: Conjunctivae are normal.  Neck: Normal range of motion. Neck supple.  Cardiovascular: Normal rate, regular rhythm and normal heart sounds.   Pulmonary/Chest: Effort normal and breath sounds normal. She has no wheezes.  Musculoskeletal:  Tenderness over C-spine with palpitation. Pain over Paraspinous muscle and up neck. ROM of neck limited in all directions due to pain. Pain with palpation behind bilateral ears but worse on left. Strength 5/5 of neck. Strength 5/5 of bilateral arms.   Lymphadenopathy:    She has no cervical adenopathy.  Neurological: She is alert and oriented to person, place, and time.  Skin: Skin is warm and dry.  Psychiatric: She has a normal mood and affect. Her behavior is normal.          Assessment & Plan:  Neck pain/headache/neuropathy- I do suspect that neck pain is causing headache today. Pt given flexeril, prednisone, and tramadol. Pt encouraged to take ibuprofen 800mg  up to TID for neck pain/headache. Pt given HO  with stretches for neck. Encouraged cold compresses on neck. Xrays were ordered for cervical/thoracic spine since recent breast pain. I will also check a CBC to make sure white count looks good. Follow up if not improving in next week.   HTN/elevated blood pressure- discussed with patient that she needed to take her medication. Wait for a day or so until BP has decreased to start prednisone. Pt agreed to check her BP before starting prednisone.

## 2013-06-03 ENCOUNTER — Other Ambulatory Visit: Payer: Self-pay | Admitting: Physician Assistant

## 2013-06-03 DIAGNOSIS — R9389 Abnormal findings on diagnostic imaging of other specified body structures: Secondary | ICD-10-CM

## 2013-06-05 ENCOUNTER — Ambulatory Visit (HOSPITAL_BASED_OUTPATIENT_CLINIC_OR_DEPARTMENT_OTHER)
Admission: RE | Admit: 2013-06-05 | Discharge: 2013-06-05 | Disposition: A | Discharge status: Home / Self Care | Payer: PRIVATE HEALTH INSURANCE | Source: Ambulatory Visit | Attending: Physician Assistant | Admitting: Physician Assistant

## 2013-06-05 ENCOUNTER — Encounter: Payer: Self-pay | Admitting: Physician Assistant

## 2013-06-05 DIAGNOSIS — I6522 Occlusion and stenosis of left carotid artery: Secondary | ICD-10-CM | POA: Insufficient documentation

## 2013-06-05 DIAGNOSIS — R9389 Abnormal findings on diagnostic imaging of other specified body structures: Secondary | ICD-10-CM

## 2013-06-05 DIAGNOSIS — I6529 Occlusion and stenosis of unspecified carotid artery: Secondary | ICD-10-CM | POA: Insufficient documentation

## 2013-06-17 ENCOUNTER — Telehealth: Payer: Self-pay

## 2013-06-17 NOTE — Telephone Encounter (Signed)
Patient called and left a message on nurse line asking for a return call.   Returned Call: Left message asking patient to call back.  

## 2013-07-12 ENCOUNTER — Other Ambulatory Visit: Payer: Self-pay | Admitting: Family Medicine

## 2013-07-15 ENCOUNTER — Other Ambulatory Visit: Payer: Self-pay | Admitting: Family Medicine

## 2013-07-18 ENCOUNTER — Other Ambulatory Visit: Payer: Self-pay | Admitting: Family Medicine

## 2013-07-18 ENCOUNTER — Telehealth: Payer: Self-pay | Admitting: *Deleted

## 2013-07-18 NOTE — Telephone Encounter (Signed)
Ok for 2 refills

## 2013-07-18 NOTE — Telephone Encounter (Signed)
Pt is asking for a refill on her xanax.  She's due tomorrow but I wanted to ask if you wanted to send refills & if so, how many.  Please advise

## 2013-07-19 NOTE — Telephone Encounter (Signed)
rx faxed

## 2013-08-12 ENCOUNTER — Other Ambulatory Visit: Payer: Self-pay | Admitting: Family Medicine

## 2013-08-19 ENCOUNTER — Other Ambulatory Visit: Payer: Self-pay | Admitting: Family Medicine

## 2013-08-22 ENCOUNTER — Ambulatory Visit: Payer: Self-pay | Admitting: Family Medicine

## 2013-08-28 ENCOUNTER — Ambulatory Visit (INDEPENDENT_AMBULATORY_CARE_PROVIDER_SITE_OTHER): Payer: Self-pay | Admitting: Family Medicine

## 2013-08-28 ENCOUNTER — Encounter: Payer: Self-pay | Admitting: Family Medicine

## 2013-08-28 VITALS — BP 113/75 | HR 87 | Temp 98.2°F | Ht 65.0 in | Wt 191.0 lb

## 2013-08-28 DIAGNOSIS — R0602 Shortness of breath: Secondary | ICD-10-CM

## 2013-08-28 DIAGNOSIS — N644 Mastodynia: Secondary | ICD-10-CM

## 2013-08-28 DIAGNOSIS — M79604 Pain in right leg: Secondary | ICD-10-CM

## 2013-08-28 DIAGNOSIS — M79605 Pain in left leg: Secondary | ICD-10-CM

## 2013-08-28 DIAGNOSIS — M79609 Pain in unspecified limb: Secondary | ICD-10-CM

## 2013-08-28 DIAGNOSIS — Z23 Encounter for immunization: Secondary | ICD-10-CM

## 2013-08-28 MED ORDER — TRAMADOL HCL 50 MG PO TABS: 50.0000 mg | ORAL_TABLET | Freq: Four times a day (QID) | ORAL | Status: DC | PRN

## 2013-08-28 MED ORDER — AMITRIPTYLINE HCL 25 MG PO TABS: 25.0000 mg | ORAL_TABLET | Freq: Every day | ORAL | Status: DC

## 2013-08-28 NOTE — Patient Instructions (Signed)
Farxiga 5mg  once a day.   Follow up as soon as your health insurance kicks in for diabetic check-up.

## 2013-08-28 NOTE — Progress Notes (Signed)
Subjective:    Patient ID: Erin [name], female    DOB: 1956/06/28, 58 y.o.   MRN: [id number]  HPI pt reports that pain began a few months agoin both breast the pain does not happen every day she has not had and d/c from breast or redness. some tenderness at times. Pain is worse on the right. Pain is lateral part of breath.  Last mammo was 2 years ago (06/2010).  Mother with hx of BrCa.  Occ feels SOB at rest. doesn happen when she works out.  She says will feel her heart flutter when it happens. It is brief.   Has not been able to afford her insulin. Started a few weeks ago.    Leg pain has been worse of ethe last few week. Keeping her awake at night. She says both of her legs have been hurting. She says it's the entire leg. It doesn't bother her much during the daytime but mostly occurs at night. Wonder if this could be somewhat positional. Again her sugars have not been well controlled.  DM- she has been off of her insulin because she has not been able to afford it. She does have a new job that she started 3 weeks ago but her health insurance has not kicked in yet. She has been taking her glipizide. She's intolerant to metformin. I have given her samples of Imdur, but it caused kidney pain so she stopped it. Her glucometer is broken so she has not been able to check her sugars.  Review of Systems     Objective:   Physical Exam  Constitutional: She is oriented to person, place, and time. She appears well-developed and well-nourished.  HENT:  Head: Normocephalic and atraumatic.  Cardiovascular: Normal rate, regular rhythm and normal heart sounds.   Pulmonary/Chest: Effort normal and breath sounds normal. Right breast exhibits no inverted nipple, no mass, no nipple discharge, no skin change and no tenderness. Left breast exhibits no inverted nipple, no mass, no nipple discharge, no skin change and no tenderness. Breasts are symmetrical.  Neurological: She is alert and oriented to  person, place, and time.  Skin: Skin is warm and dry.  Psychiatric: She has a normal mood and affect. Her behavior is normal.          Assessment & Plan:  Breast pain-exam is benign and reassuring. I do not palpate any lesions. I wonder if this could be related to new bra where that she wondered off of PVC recently. Asked her she actually been measured and she has not. It may be worth getting fitted for new bras and making sure that she's wearing the correct size with good support. In the meantime she is him is 3 years behind on her mammogram. I would like to get this ordered. She will check to find out when her insurance kicks in. If it's within the next couple weeks and we can wait. If it's going to be 2 or 3 months that we need to go ahead and move forward in order this.  Diabetes-again she needs a full evaluation and a glucometer so I'm hoping that her insurance will kick in in the next couple of weeks. In the meantime we will try for a get him placed the Invokana. Given 3 weeks worth of samples to try. Works to give 5 mg daily. Next  Leg pain-unclear etiology. This certainly could be related to uncontrolled blood sugars. She does need additional blood work for workup including ferritin  and CBC. We will hopefully be able to obtain this when she gets insurance. In the meantime we'll try amitriptyline 25 mg 1-2 tabs at bedtime as needed.  Shortness of breathLung  exam is normal today. Pulse ox is normal as well which is reassuring. It never happens with activity or exercise which is wonderful as well. Certainly this could be palpitations. Further workup at follow up appointment recommended and possible Holter monitor.

## 2013-08-30 ENCOUNTER — Telehealth: Payer: Self-pay | Admitting: *Deleted

## 2013-08-30 ENCOUNTER — Other Ambulatory Visit: Payer: Self-pay | Admitting: Physician Assistant

## 2013-08-30 MED ORDER — GABAPENTIN 100 MG PO CAPS: 100.0000 mg | ORAL_CAPSULE | Freq: Every day | ORAL | Status: DC

## 2013-08-30 NOTE — Telephone Encounter (Signed)
Ok stop amitriptyline. Try neurontin 100mg  qhs.

## 2013-08-30 NOTE — Telephone Encounter (Signed)
Increase to 100mg  or 4 tabs and see if that helps. If not consider switching to another medication.

## 2013-08-30 NOTE — Telephone Encounter (Signed)
Pt notified to increase to 4 tabs at bedtime and she does not want to take this med at all- she states that it made her feel funny and see spots in front of her eyes. Clemetine Marker, LPN

## 2013-08-30 NOTE — Telephone Encounter (Signed)
Pt calls and states that the elavil 25mg  2 tabs at bedtime is not helping her leg pain at all and wants to know if there is something elses she can give her for it. Clemetine Marker, LPN

## 2013-08-30 NOTE — Telephone Encounter (Signed)
Pt notified and voiced understanding 

## 2013-09-16 ENCOUNTER — Other Ambulatory Visit: Payer: Self-pay | Admitting: Family Medicine

## 2013-09-16 ENCOUNTER — Other Ambulatory Visit: Payer: Self-pay | Admitting: Physician Assistant

## 2013-09-23 ENCOUNTER — Other Ambulatory Visit: Payer: Self-pay | Admitting: Family Medicine

## 2013-09-23 ENCOUNTER — Telehealth: Payer: Self-pay | Admitting: *Deleted

## 2013-09-23 ENCOUNTER — Other Ambulatory Visit: Payer: Self-pay | Admitting: Physician Assistant

## 2013-09-23 MED ORDER — DAPAGLIFLOZIN PROPANEDIOL 5 MG PO TABS: ORAL_TABLET | ORAL | Status: DC

## 2013-09-23 NOTE — Telephone Encounter (Signed)
Sent!

## 2013-09-23 NOTE — Telephone Encounter (Signed)
Pt calls and states that she was given samples of Farxiga 5mg  by Dr. Madilyn Fireman and would like to get rx sent to [facility] in [place].

## 2013-09-30 ENCOUNTER — Ambulatory Visit (INDEPENDENT_AMBULATORY_CARE_PROVIDER_SITE_OTHER): Payer: PRIVATE HEALTH INSURANCE | Admitting: Physician Assistant

## 2013-09-30 ENCOUNTER — Ambulatory Visit (INDEPENDENT_AMBULATORY_CARE_PROVIDER_SITE_OTHER): Payer: PRIVATE HEALTH INSURANCE

## 2013-09-30 VITALS — BP 121/80 | HR 80 | Temp 98.0°F | Ht 66.0 in | Wt 191.0 lb

## 2013-09-30 DIAGNOSIS — Z23 Encounter for immunization: Secondary | ICD-10-CM

## 2013-09-30 DIAGNOSIS — E1165 Type 2 diabetes mellitus with hyperglycemia: Secondary | ICD-10-CM | POA: Insufficient documentation

## 2013-09-30 DIAGNOSIS — IMO0002 Reserved for concepts with insufficient information to code with codable children: Secondary | ICD-10-CM

## 2013-09-30 DIAGNOSIS — E785 Hyperlipidemia, unspecified: Secondary | ICD-10-CM

## 2013-09-30 DIAGNOSIS — Z1231 Encounter for screening mammogram for malignant neoplasm of breast: Secondary | ICD-10-CM

## 2013-09-30 DIAGNOSIS — IMO0001 Reserved for inherently not codable concepts without codable children: Secondary | ICD-10-CM

## 2013-09-30 DIAGNOSIS — E119 Type 2 diabetes mellitus without complications: Secondary | ICD-10-CM

## 2013-09-30 DIAGNOSIS — R0602 Shortness of breath: Secondary | ICD-10-CM

## 2013-09-30 LAB — POCT GLYCOSYLATED HEMOGLOBIN (HGB A1C): Hemoglobin A1C: 12.4

## 2013-09-30 MED ORDER — AMBULATORY NON FORMULARY MEDICATION: Status: DC

## 2013-09-30 MED ORDER — INSULIN GLARGINE 100 UNIT/ML ~~LOC~~ SOLN: SUBCUTANEOUS | Status: DC

## 2013-09-30 MED ORDER — OMEPRAZOLE 40 MG PO CPDR: 40.0000 mg | DELAYED_RELEASE_CAPSULE | Freq: Every day | ORAL | Status: DC

## 2013-09-30 NOTE — Patient Instructions (Signed)
Get CXR

## 2013-09-30 NOTE — Progress Notes (Signed)
   Subjective:    Patient ID: Erin [name], female    DOB: 12-30-1955, 58 y.o.   MRN: [id number]  HPI Patient presents to the clinic to f/u on diabetes. She has not been taking her lantus due to not having insurance. She wants to get back on it. She is not checking glucose at this time. She takes faraxgia and glipizide daily. Cannot tolerate metformin.denies any hypoglycemia events. No open or unhealed sores. No vision changes or neuropathy. Needs eye exam. On ace and statin.   Pt is having some SOB that concerns her. It has been ongoing for over 6 months. It is located in the center of her chest and feels like pressure with no radiation. No jaw pain or CP. Does not respond to albuterol. Happens some after eating. Denies any wheezing. Not affected by exercise.   Dyslipidemia- needs medication refill. Continues to take zocor.   HTN- needs refilled. Denies any CP, palpitations, vision changes, or headaches.    Review of Systems     Objective:   Physical Exam  Constitutional: She is oriented to person, place, and time. She appears well-developed and well-nourished.  HENT:  Head: Normocephalic and atraumatic.  Eyes: Conjunctivae are normal.  Neck: Normal range of motion. Neck supple.  Cardiovascular: Normal rate, regular rhythm and normal heart sounds.   Pulmonary/Chest: Effort normal and breath sounds normal. She has no wheezes.  Lymphadenopathy:    She has no cervical adenopathy.  Neurological: She is alert and oriented to person, place, and time.  Skin: Skin is dry.  Psychiatric: She has a normal mood and affect. Her behavior is normal.          Assessment & Plan:  DM, uncontrolled- A1C is 12.4. Pt was given rx to get glucometer, lancets and test strips. Pt needs to start checking glucose fasting in the morning. Pt has not been taking Lantus. Gave sample pen today along with rx to get filled. Start Lantus 15 units and can increase by 1 unit nightly to her regular 30 units or  until fasting glucose around 100. Continue faraxiga and glipizide daily. Reminded of importance of low carb/low sugar diet. Exercise encouraged. Pt already on ace, statin. Needs eye exam. Prevnar shot given.  Follow up in 3 months.    SOB- peak flows in the green zone. Pt did not respone to albuterol in the past. Will get CXR since former smoker. Symptoms could be do to acid reflux. I would like to start omeprazole daily and see if helps SOB. If not improving or worsening please follow up.   Dyslipidemia- will check lipid profile.  HTN- Will refill medication. Will check kidney function    Ordered mammogram.

## 2013-10-10 ENCOUNTER — Ambulatory Visit: Payer: PRIVATE HEALTH INSURANCE

## 2013-10-15 ENCOUNTER — Other Ambulatory Visit: Payer: Self-pay | Admitting: *Deleted

## 2013-10-15 ENCOUNTER — Ambulatory Visit: Payer: PRIVATE HEALTH INSURANCE

## 2013-10-15 MED ORDER — INSULIN GLARGINE 100 UNIT/ML ~~LOC~~ SOLN: SUBCUTANEOUS | Status: DC

## 2013-10-16 ENCOUNTER — Telehealth: Payer: Self-pay | Admitting: *Deleted

## 2013-10-16 ENCOUNTER — Other Ambulatory Visit: Payer: Self-pay | Admitting: Family Medicine

## 2013-10-16 NOTE — Telephone Encounter (Signed)
Pt states she will call her insurance company and call me back when she finds out something.  Oscar La, LPN

## 2013-10-16 NOTE — Telephone Encounter (Signed)
Pt states thaat she called pharmacy about the price of the insulin and states that it is over $200 with her insurance and would like to know if we can send something different.  Oscar La, LPN

## 2013-10-16 NOTE — Telephone Encounter (Signed)
She may want to call her insurance company and see if she has a deductible. The only alternative her Lantus is Levemir. We can certainly call and that it may be approximately the same price if she has a deductible on her prescription coverage. She will have to pay out of pocket until she meets back and then will go down to its usual price. he may also require that she uses mail order to save money and that might be why the cost as high as well. See what she was previously paying for it.

## 2013-10-22 ENCOUNTER — Other Ambulatory Visit: Payer: Self-pay | Admitting: Family Medicine

## 2013-10-22 ENCOUNTER — Ambulatory Visit: Payer: PRIVATE HEALTH INSURANCE | Admitting: Family Medicine

## 2013-10-22 ENCOUNTER — Other Ambulatory Visit: Payer: Self-pay | Admitting: Physician Assistant

## 2013-10-29 ENCOUNTER — Telehealth: Payer: Self-pay | Admitting: *Deleted

## 2013-10-29 NOTE — Telephone Encounter (Signed)
Pt states she is still having bad leg pain. States that Ibuprofen helps a little, Tramadol makes her sick on her stomach and the Neurotin isn't helping. Please advise.  Oscar La, LPN

## 2013-10-30 NOTE — Telephone Encounter (Signed)
Pt notified and she states that she is going to have blood work done on Thursday that you ordered previously but hasn't had chance to do it yet.  Do you want her to get iron and B12 level checked while having it drawn?  I sent her to front to schedule appointment with you as well to discuss and evaluate. Clemetine Marker, LPN

## 2013-10-30 NOTE — Telephone Encounter (Signed)
LMOM for pt to return call to discuss response.  Oscar La, LPN

## 2013-10-30 NOTE — Telephone Encounter (Signed)
The problem is we really need to figure out why she's having leg pain. I really need to be able to do additional blood work on her to check iron B12 etc. These can cause nerve type pain. Also want to be able to evaluate her low back. She could have impingement of nerves they're causing her leg pain. Unless I really know what's triggering it I don't know the best option to treat it. Continuing to throat pain medication at it really isn't going to solve the problem. Since out of prison does help he did see if she's been taking 800 mg 3 times a day. That would be the maximum.

## 2013-10-31 ENCOUNTER — Encounter: Payer: Self-pay | Admitting: Family Medicine

## 2013-10-31 ENCOUNTER — Ambulatory Visit (INDEPENDENT_AMBULATORY_CARE_PROVIDER_SITE_OTHER): Payer: PRIVATE HEALTH INSURANCE | Admitting: Family Medicine

## 2013-10-31 ENCOUNTER — Telehealth: Payer: Self-pay | Admitting: *Deleted

## 2013-10-31 ENCOUNTER — Ambulatory Visit (INDEPENDENT_AMBULATORY_CARE_PROVIDER_SITE_OTHER): Payer: PRIVATE HEALTH INSURANCE

## 2013-10-31 VITALS — BP 122/89 | HR 80 | Wt 187.0 lb

## 2013-10-31 DIAGNOSIS — E669 Obesity, unspecified: Secondary | ICD-10-CM | POA: Insufficient documentation

## 2013-10-31 DIAGNOSIS — E1142 Type 2 diabetes mellitus with diabetic polyneuropathy: Secondary | ICD-10-CM

## 2013-10-31 DIAGNOSIS — E1149 Type 2 diabetes mellitus with other diabetic neurological complication: Secondary | ICD-10-CM

## 2013-10-31 DIAGNOSIS — E1140 Type 2 diabetes mellitus with diabetic neuropathy, unspecified: Secondary | ICD-10-CM

## 2013-10-31 DIAGNOSIS — G609 Hereditary and idiopathic neuropathy, unspecified: Secondary | ICD-10-CM

## 2013-10-31 DIAGNOSIS — Z1231 Encounter for screening mammogram for malignant neoplasm of breast: Secondary | ICD-10-CM

## 2013-10-31 LAB — COMPLETE METABOLIC PANEL WITH GFR
ALT: 51 U/L — ABNORMAL HIGH (ref 0–35)
AST: 29 U/L (ref 0–37)
Albumin: 4.1 g/dL (ref 3.5–5.2)
Alkaline Phosphatase: 72 U/L (ref 39–117)
BUN: 15 mg/dL (ref 6–23)
CALCIUM: 9.7 mg/dL (ref 8.4–10.5)
CO2: 27 mEq/L (ref 19–32)
Chloride: 101 mEq/L (ref 96–112)
Creat: 0.96 mg/dL (ref 0.50–1.10)
GFR, Est African American: 75 mL/min
GFR, Est Non African American: 65 mL/min
Glucose, Bld: 273 mg/dL — ABNORMAL HIGH (ref 70–99)
Potassium: 4.5 mEq/L (ref 3.5–5.3)
Sodium: 137 mEq/L (ref 135–145)
Total Bilirubin: 0.6 mg/dL (ref 0.2–1.2)
Total Protein: 6.9 g/dL (ref 6.0–8.3)

## 2013-10-31 LAB — LIPID PANEL
CHOLESTEROL: 221 mg/dL — AB (ref 0–200)
HDL: 35 mg/dL — ABNORMAL LOW (ref >39)
LDL Cholesterol: 144 mg/dL — ABNORMAL HIGH (ref 0–99)
Total CHOL/HDL Ratio: 6.3 Ratio
Triglycerides: 208 mg/dL — ABNORMAL HIGH (ref <150)
VLDL: 42 mg/dL — ABNORMAL HIGH (ref 0–40)

## 2013-10-31 MED ORDER — GABAPENTIN 100 MG PO CAPS: ORAL_CAPSULE | ORAL | Status: DC

## 2013-10-31 NOTE — Progress Notes (Signed)
Subjective:    Patient ID: Erin [name], female    DOB: Oct 09, 1955, 58 y.o.   MRN: [id number]  HPI Here to followup for bilateral leg pain-note she does have a history of uncontrolled diabetes. Her last hemoglobin A1c was greater than 12. Unfortunately she ran out of insurance for time . He was unable to take her medications consistently. There she's really not been well controlled over the last 2 years. We did start her on amitriptyline back in December. She called and felt like it wasn't helping plus it was causing some vision changes and didn't like how she felt on it so she discontinued it. There is a comment that recommends Neurontin but it's unclear whether not she actually felt this medication and took it or not. She also has a history of chronic low back pain. Previously we had discussed getting additional lab work to rule out anemia etc. but at the time she did not have health insurance.  Describes her pain is severe, feels like sharp stabbing knives. Worse at night.  Keeps her up at night goes through her toes and then feels an intense aching up her legs. Does take IBU and helps.  Rates her pain 10/10 at time. Walking seems to help some as well. Notices when takes her insulin her pain is better. She is only using 25 units of lantus to spread it our    Review of Systems  BP 122/89  Pulse 80  Wt 187 lb (84.823 kg)  SpO2 96%    Allergies  Allergen Reactions  . Amitriptyline Other (See Comments)    Vision change.   . Hydrochlorothiazide     REACTION: cause,weird feelings  . Liraglutide     REACTION: N/V  . Metformin     REACTION: swelling  . Tramadol Nausea And Vomiting    Past Medical History  Diagnosis Date  . Kidney stones   . Diabetes mellitus   . Hypertension   . Obesity   . Anxiety   . Fatty liver     Past Surgical History  Procedure Laterality Date  . Abdominal hysterectomy      complete with BSO for dysplasia  . [place]  08[place]     History   Social History  . Marital Status: Married    Spouse Name: N/A    Number of Children: N/A  . Years of Education: N/A   Occupational History  . Not on file.   Social History Main Topics  . Smoking status: Former Smoker    Quit date: 08/09/2003  . Smokeless tobacco: Not on file  . Alcohol Use: No  . Drug Use: No  . 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   Other Topics Concern  . Not on file   Social History Narrative  . No narrative on file    Family History  Problem Relation Age of Onset  . Cancer Mother     breast and kidney  . Kidney disease Son   . Cancer Other     pancreatic cancer    Outpatient Encounter Prescriptions as of 10/31/2013  Medication Sig  . Dapagliflozin Propanediol (FARXIGA) 5 MG TABS Take 1 tablet by mouth daily.  Marland Kitchen ALPRAZolam (XANAX) 0.5 MG tablet TAKE ONE TABLET BY MOUTH TWICE DAILY  . AMBULATORY NON FORMULARY MEDICATION Medication Name: Freestyle Insulix  Test Strips TID testing Diagnosis Code:250.0  . AMBULATORY NON FORMULARY MEDICATION Medication Name: Relion meter and test strips.  . AMBULATORY NON FORMULARY MEDICATION Glucometer device, lancets and test strips.   Dx: 250.02  . amLODipine (NORVASC) 5 MG tablet TAKE ONE TABLET BY MOUTH EVERY DAY  . aspirin 81 MG tablet Take 81 mg by mouth daily.    Marland Kitchen atenolol (TENORMIN) 50 MG tablet Take 1 tablet (50 mg total) by mouth 2 (two) times daily.  Marland Kitchen b complex vitamins tablet Take 1 tablet by mouth daily.  Marland Kitchen gabapentin (NEURONTIN) 100 MG capsule One at bedtime for 5 days then increaes to BID x 5 days then increase to 2 at bedtime and one in AM for leg pain  . glipiZIDE (GLUCOTROL) 5 MG tablet TAKE ONE TABLET BY MOUTH TWICE DAILY BEFORE MEALS  . insulin glargine (LANTUS) 100 UNIT/ML injection Inject 30 units daily  . Insulin Pen Needle (EXEL PEN  NEEDLES 29GX1/2") 29G X 12MM MISC Use daily as directed.  Marland Kitchen lisinopril (PRINIVIL,ZESTRIL) 40 MG tablet Take 1 tablet (40 mg total) by mouth daily.  . Multiple Vitamin (MULTIVITAMIN) tablet Take 1 tablet by mouth daily.  . simvastatin (ZOCOR) 40 MG tablet Take 1 tablet (40 mg total) by mouth at bedtime.  . [DISCONTINUED] amitriptyline (ELAVIL) 25 MG tablet Take 1-2 tablets (25-50 mg total) by mouth at bedtime. PRN leg pain  . [DISCONTINUED] Dapagliflozin Propanediol 5 MG TABS Take one tablet by mouth daily.  . [DISCONTINUED] omeprazole (PRILOSEC) 40 MG capsule Take 1 capsule (40 mg total) by mouth daily.  . [DISCONTINUED] traMADol (ULTRAM) 50 MG tablet TAKE ONE TABLET BY MOUTH EVERY 6 HOURS AS NEEDED          Objective:   Physical Exam  Constitutional: She is oriented to person, place, and time. She appears well-developed and well-nourished.  HENT:  Head: Normocephalic and atraumatic.  Cardiovascular: Normal rate, regular rhythm and normal heart sounds.   Pulmonary/Chest: Effort normal and breath sounds normal.  Neurological: She is alert and oriented to person, place, and time.  Skin: Skin is warm and dry.  Psychiatric: She has a normal mood and affect. Her behavior is normal.          Assessment & Plan:  Neuropathy of both legs-  Uncontrolled.  Will check B12, iron deficiency. We'll check thyroid level as well. Recommend a trial of Neurontin. Consider that this could be secondary to poorly controlled diabetes versus low back pain issues. 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 I did add the amitriptyline and tramadol to her intolerance list. I would like to start Neurontin 100 mg at bedtime and increase as tolerated. I will see her back in 8 weeks for the diabetes as well as her neuropathy. She has a positive medication then please call. She's not currently having any back pain or problems. He did have a normal monofilament exam today.

## 2013-10-31 NOTE — Telephone Encounter (Signed)
Called pt to inform her that she will need to go to the lab to have additional blood drawn.Erin [name]

## 2013-11-19 ENCOUNTER — Other Ambulatory Visit: Payer: Self-pay | Admitting: Physician Assistant

## 2013-11-20 LAB — FERRITIN: FERRITIN: 173 ng/mL (ref 10–291)

## 2013-11-20 LAB — CBC
HEMATOCRIT: 43.5 % (ref 36.0–46.0)
HEMOGLOBIN: 15.3 g/dL — AB (ref 12.0–15.0)
MCH: 29.7 pg (ref 26.0–34.0)
MCHC: 35.2 g/dL (ref 30.0–36.0)
MCV: 84.3 fL (ref 78.0–100.0)
Platelets: 289 10*3/uL (ref 150–400)
RBC: 5.16 MIL/uL — ABNORMAL HIGH (ref 3.87–5.11)
RDW: 13.7 % (ref 11.5–15.5)
WBC: 7.5 10*3/uL (ref 4.0–10.5)

## 2013-11-20 LAB — VITAMIN B12: Vitamin B-12: 961 pg/mL — ABNORMAL HIGH (ref 211–911)

## 2013-11-20 LAB — TSH: TSH: 0.707 u[IU]/mL (ref 0.350–4.500)

## 2013-11-21 ENCOUNTER — Other Ambulatory Visit: Payer: Self-pay | Admitting: *Deleted

## 2013-11-21 DIAGNOSIS — R748 Abnormal levels of other serum enzymes: Secondary | ICD-10-CM

## 2013-11-25 ENCOUNTER — Other Ambulatory Visit: Payer: Self-pay | Admitting: Physician Assistant

## 2013-11-26 ENCOUNTER — Other Ambulatory Visit: Payer: Self-pay | Admitting: Family Medicine

## 2013-12-02 ENCOUNTER — Telehealth: Payer: Self-pay | Admitting: *Deleted

## 2013-12-02 MED ORDER — PREGABALIN 50 MG PO CAPS: 50.0000 mg | ORAL_CAPSULE | Freq: Three times a day (TID) | ORAL | Status: DC

## 2013-12-02 NOTE — Telephone Encounter (Signed)
Call pt: Prescription presented for Lyrica. She really has to be patient with Lyrica medication. Can take several weeks to really kick in. Keep regularly scheduled followup in that we can adjust her dose if needed at that time.

## 2013-12-02 NOTE — Telephone Encounter (Signed)
Pt states she is taking 200mg  in pm and 100mg  in am. States that the Neurotin isn't helping and wants to try the lyrica. Informed pt she would have to pick it up. States her back is not hurting at all.  Oscar La, LPN

## 2013-12-02 NOTE — Addendum Note (Signed)
Addended by: Beatrice Lecher D on: 12/02/2013 01:07 PM   Modules accepted: Orders, Medications

## 2013-12-02 NOTE — Telephone Encounter (Signed)
Weight dose of Neurontin did she get up to? We can go as high as 6 or milligrams up to 3 times a day if needed. We could also try Lyrica which is a another prescription medication. Also is having some pain in her back we can always get an x-ray of her lumbar spine.

## 2013-12-02 NOTE — Telephone Encounter (Signed)
Pt LM that her feet, legs and ankles are hurting and nothing is helping and hung up. Please advise.  Oscar La, LPN

## 2013-12-02 NOTE — Telephone Encounter (Signed)
Pt informed.  Natha Guin, LPN  

## 2013-12-05 ENCOUNTER — Telehealth: Payer: Self-pay | Admitting: *Deleted

## 2013-12-05 DIAGNOSIS — G629 Polyneuropathy, unspecified: Secondary | ICD-10-CM

## 2013-12-05 MED ORDER — GABAPENTIN 100 MG PO CAPS: ORAL_CAPSULE | ORAL | Status: DC

## 2013-12-05 NOTE — Telephone Encounter (Signed)
I really want her to maximize the Neurontin first. She was on a very low dose. He can make a little bit sleepy when he first started when he got up on the dose but the body gets accustomed to a pretty quickly. In the meantime I would like to go ahead and refer her to a neurologist for the peripheral neuropathy as well. Since her pain has not been controlled with typical agents.

## 2013-12-05 NOTE — Telephone Encounter (Signed)
Informed pt on how to increase her Neurotin and that she is being referred to a neurologist.  Oscar La, LPN

## 2013-12-05 NOTE — Telephone Encounter (Signed)
Pt states the Lyrica is over $300 and she can't afford it. She is asking for a pain medication. States she took a half Percocet from her friend and a medication you gave her and the pain went away. Please advise.  Oscar La, LPN

## 2013-12-13 ENCOUNTER — Other Ambulatory Visit: Payer: Self-pay | Admitting: *Deleted

## 2013-12-13 ENCOUNTER — Other Ambulatory Visit: Payer: Self-pay | Admitting: Family Medicine

## 2013-12-13 MED ORDER — TRAMADOL HCL 50 MG PO TABS: ORAL_TABLET | ORAL | Status: DC

## 2013-12-16 ENCOUNTER — Telehealth: Payer: Self-pay

## 2013-12-16 NOTE — Telephone Encounter (Signed)
I scheduled her for Thursday for follow up. She wanted to know if she could get a refill on Tramadol.

## 2013-12-16 NOTE — Telephone Encounter (Signed)
Let have her make appt

## 2013-12-16 NOTE — Telephone Encounter (Signed)
We just refilled on Friday. Did she get the prescription?

## 2013-12-16 NOTE — Telephone Encounter (Signed)
Patient called and left a message stating she is still having leg pain. She states her appointment with the Neurologist is not for a few weeks. Please advise.

## 2013-12-18 NOTE — Telephone Encounter (Signed)
Patient advised.

## 2013-12-19 ENCOUNTER — Ambulatory Visit: Payer: PRIVATE HEALTH INSURANCE | Admitting: Family Medicine

## 2013-12-25 ENCOUNTER — Other Ambulatory Visit: Payer: Self-pay | Admitting: Family Medicine

## 2014-01-06 ENCOUNTER — Other Ambulatory Visit: Payer: Self-pay | Admitting: Family Medicine

## 2014-01-10 ENCOUNTER — Ambulatory Visit: Payer: PRIVATE HEALTH INSURANCE | Admitting: Neurology

## 2014-01-10 ENCOUNTER — Telehealth: Payer: Self-pay | Admitting: Neurology

## 2014-01-10 NOTE — Telephone Encounter (Signed)
This patient did not show for a new patient appointment today. 

## 2014-01-13 ENCOUNTER — Encounter: Payer: Self-pay | Admitting: Family Medicine

## 2014-01-13 ENCOUNTER — Telehealth: Payer: Self-pay | Admitting: Family Medicine

## 2014-01-13 ENCOUNTER — Ambulatory Visit (INDEPENDENT_AMBULATORY_CARE_PROVIDER_SITE_OTHER): Payer: PRIVATE HEALTH INSURANCE | Admitting: Family Medicine

## 2014-01-13 ENCOUNTER — Ambulatory Visit (INDEPENDENT_AMBULATORY_CARE_PROVIDER_SITE_OTHER): Payer: Self-pay

## 2014-01-13 ENCOUNTER — Other Ambulatory Visit: Payer: Self-pay | Admitting: Family Medicine

## 2014-01-13 ENCOUNTER — Telehealth: Payer: Self-pay | Admitting: *Deleted

## 2014-01-13 VITALS — BP 148/92 | HR 85 | Wt 184.0 lb

## 2014-01-13 DIAGNOSIS — M545 Low back pain, unspecified: Secondary | ICD-10-CM

## 2014-01-13 DIAGNOSIS — R202 Paresthesia of skin: Secondary | ICD-10-CM

## 2014-01-13 DIAGNOSIS — IMO0001 Reserved for inherently not codable concepts without codable children: Secondary | ICD-10-CM

## 2014-01-13 DIAGNOSIS — E1165 Type 2 diabetes mellitus with hyperglycemia: Secondary | ICD-10-CM

## 2014-01-13 DIAGNOSIS — R209 Unspecified disturbances of skin sensation: Secondary | ICD-10-CM

## 2014-01-13 DIAGNOSIS — M5137 Other intervertebral disc degeneration, lumbosacral region: Secondary | ICD-10-CM

## 2014-01-13 DIAGNOSIS — M4120 Other idiopathic scoliosis, site unspecified: Secondary | ICD-10-CM

## 2014-01-13 MED ORDER — KETOROLAC TROMETHAMINE 60 MG/2ML IM SOLN
60.0000 mg | Freq: Once | INTRAMUSCULAR | Status: AC
  Administered 2014-01-13: 60 mg via INTRAMUSCULAR

## 2014-01-13 MED ORDER — OXYCODONE-ACETAMINOPHEN 5-325 MG PO TABS: 1.0000 | ORAL_TABLET | ORAL | Status: DC | PRN

## 2014-01-13 MED ORDER — GLIPIZIDE 10 MG PO TABS: ORAL_TABLET | ORAL | Status: DC

## 2014-01-13 MED ORDER — TRAMADOL HCL 50 MG PO TABS: ORAL_TABLET | ORAL | Status: DC

## 2014-01-13 NOTE — Telephone Encounter (Signed)
Call pt: I increased her glipizide to 10mg  BID for better controll of sugars.

## 2014-01-13 NOTE — Telephone Encounter (Signed)
Pt called stating pain in legs and ankles is horrible and is asking what can she do. We previously sent Lyrica but she stated she couldn't afford it. Please advise.

## 2014-01-13 NOTE — Telephone Encounter (Signed)
Need appt w/ me

## 2014-01-13 NOTE — Telephone Encounter (Signed)
Pt transferred to schedule appt with Dr. Madilyn Fireman.  Oscar La, LPN

## 2014-01-13 NOTE — Progress Notes (Signed)
   Subjective:    Patient ID: Erin [name], female    DOB: 09/21/1955, 58 y.o.   MRN: [id number]  HPI Here to discuss leg pain. She says that from her knees down she has a constant pins and needles and sharp sensation. It also feels somewhat known. Occasionally will go as high as her hips. She's had some recent low back pain after recently going to the beach. We have significantly increased her gabapentin without any relief in symptoms. We also called in a prescription for Lyrica but the cost was prohibitive since she was not able to try it. She R. he takes for tramadol today. It does provide some relief but she is in severe pain. He keeps her awake at night. No position change alleviates or worsens her symptoms. No swelling of the lower sugars. She's been purposely trying to lose weight.  Unfortunately, she recently lost her health insurance and has no coverage currently. Review of Systems     Objective:   Physical Exam  Constitutional: She is oriented to person, place, and time. She appears well-developed and well-nourished.  HENT:  Head: Normocephalic and atraumatic.  Musculoskeletal:  Tender in the lumbar spine and bilateral SI joints. Lumbar spine with normal range of motion. Negative straight leg raises she did have some tightness in the hamstrings. Hip, knee, ankle [name] is 5 out of 5 bilaterally. Patellar reflexes are plus bilaterally. Reflexes are temperature but he 0+ bilaterally.  Neurological: She is alert and oriented to person, place, and time.  Skin: Skin is warm.  Psychiatric: She has a normal mood and affect.          Assessment & Plan:  Bilateral leg paresthesias-am most concerned about possible spinal stenosis. We'll start with a lumbar x-ray but will likely need MRI for further evaluation. Gave her a new perception for Percocet to use at bedtime and refill her tramadol so that she can take 4 tabs daily with a running out early. We'll give Toradol injection here  in the office for acute relief. I really want to find out the cause of her pain and I feel her pain is more signficant that waht is tyically seen with periopheral neuropathy.   DM- uncontrolled. Will inc glipizide to 10mg .

## 2014-01-15 ENCOUNTER — Telehealth: Payer: Self-pay

## 2014-01-15 ENCOUNTER — Other Ambulatory Visit: Payer: Self-pay | Admitting: Family Medicine

## 2014-01-15 MED ORDER — PREDNISONE 50 MG PO TABS: 50.0000 mg | ORAL_TABLET | Freq: Every day | ORAL | Status: DC

## 2014-01-15 NOTE — Telephone Encounter (Signed)
Patient advised and scheduled.  

## 2014-01-15 NOTE — Telephone Encounter (Signed)
Left detailed message stating she will need to come in for an office visit. Left message for her to return my call.

## 2014-01-15 NOTE — Telephone Encounter (Signed)
You ca double book her for later this morning if she can come in.

## 2014-01-15 NOTE — Telephone Encounter (Signed)
Patient advised.

## 2014-01-15 NOTE — Telephone Encounter (Signed)
Erin [name] complains of white discharge. She knows it is a yeast infection. I asked her if she was having other symptoms. She stated she does have pain during urination. I advised her she will need to come in for an office visit. Please advise.

## 2014-01-16 ENCOUNTER — Ambulatory Visit: Payer: Self-pay | Admitting: Family Medicine

## 2014-01-21 ENCOUNTER — Other Ambulatory Visit: Payer: Self-pay | Admitting: Family Medicine

## 2014-02-05 ENCOUNTER — Telehealth: Payer: Self-pay

## 2014-02-05 NOTE — Telephone Encounter (Signed)
Pt called today and wanted to get a refill on her percocet. I informed her that it is early to call our office on Monday (02-10-2014) and we can refill it on Tuesday./ [facility]

## 2014-02-10 ENCOUNTER — Other Ambulatory Visit: Payer: Self-pay

## 2014-02-10 ENCOUNTER — Telehealth: Payer: Self-pay

## 2014-02-10 MED ORDER — OXYCODONE-ACETAMINOPHEN 5-325 MG PO TABS: 1.0000 | ORAL_TABLET | ORAL | Status: DC | PRN

## 2014-02-10 MED ORDER — FLUCONAZOLE 150 MG PO TABS: 150.0000 mg | ORAL_TABLET | Freq: Every day | ORAL | Status: DC

## 2014-02-10 NOTE — Telephone Encounter (Signed)
Pt. Called and stated that she has a yeast infection and she stated that she has tried OTC medicine and it is not working. She would like to know if something could be called into the pharmacy./ Josiah Lobo, CMA

## 2014-02-10 NOTE — Telephone Encounter (Signed)
I will send her for Diflucan. If she's not better within 3 days of taking the medication she'll need to make an appointment for her symptoms.

## 2014-02-11 NOTE — Telephone Encounter (Signed)
Pt informed./Sejla Marzano,CMA 

## 2014-02-25 ENCOUNTER — Telehealth: Payer: Self-pay

## 2014-02-25 MED ORDER — OXYCODONE-ACETAMINOPHEN 5-325 MG PO TABS: 1.0000 | ORAL_TABLET | ORAL | Status: DC | PRN

## 2014-02-25 NOTE — Telephone Encounter (Signed)
Ok to fill medicatoin for now but we have to work on additional therapies so that she is not using this type of medication so often.

## 2014-02-25 NOTE — Telephone Encounter (Signed)
Erin [name] called this morning asking for a refill on her percocet. Spoke with Dr. Madilyn Fireman regarding her Percocet because I felt it was to early and she suggested since pt is in that much pain we should get her in with Dr. Darene Lamer this week and we need to make sure she is taking her Tramadol as a base line pain med. She can take Tylenol along with her Tramadol. I called pt and informed her and I got her in with Dr. Darene Lamer on Tuesday July 28 at 9:15. That was the first thing Dr. Darene Lamer has. Pt was ok with that. Pt stated that the Tramadol make her feel alittle sick so I told her to make sure she has something on her stomach when she takes the medication. Pt also stated that since her appointment with Dr. Darene Lamer is next week can she get an refill on her percocet. Please advise./Erin [name],CMA

## 2014-02-25 NOTE — Telephone Encounter (Signed)
LMOM for pt and placed rx up front at check in desk./Tomaz Janis,CMA

## 2014-03-03 ENCOUNTER — Other Ambulatory Visit: Payer: Self-pay

## 2014-03-03 MED ORDER — ALPRAZOLAM 0.5 MG PO TABS: 0.5000 mg | ORAL_TABLET | Freq: Two times a day (BID) | ORAL | Status: DC | PRN

## 2014-03-04 ENCOUNTER — Encounter: Payer: Self-pay | Admitting: Sports Medicine

## 2014-03-04 DIAGNOSIS — Z0289 Encounter for other administrative examinations: Secondary | ICD-10-CM

## 2014-03-11 ENCOUNTER — Telehealth: Payer: Self-pay

## 2014-03-11 NOTE — Telephone Encounter (Signed)
Pt called this morning wanting a refill on her percocet. She just got it filled on July 6,2015 # 45 and then again on July 21 # 45 per Dr. Madilyn Fireman. I made her make an appointment with Dr. Darene Lamer when I spoke with her on the 21 of July and that appointment was made for July 28. The pt was a no show for that appointment. I told the patient today that her percocet can not be refilled because it is to early and because she missed her appointment with Dr. Darene Lamer. I told the pt that Dr. Darene Lamer has an opening today that he can see her today and the pt refused and said no not today and hung up the phone./Yasmin Bronaugh, [place]

## 2014-03-12 NOTE — Telephone Encounter (Signed)
LMOM for pt to return my call./Trasean Delima,CMA

## 2014-03-12 NOTE — Telephone Encounter (Signed)
I agree. WE really need to get her in with Dt. [facility] or an ortho to help figure out what we can do about her pain besides just pain meds.

## 2014-03-14 NOTE — Telephone Encounter (Signed)
LMOM for pt to return my call again./Nolan Lasser,CMA

## 2014-03-21 ENCOUNTER — Ambulatory Visit (INDEPENDENT_AMBULATORY_CARE_PROVIDER_SITE_OTHER): Payer: Self-pay

## 2014-03-21 ENCOUNTER — Encounter: Payer: Self-pay | Admitting: Sports Medicine

## 2014-03-21 ENCOUNTER — Ambulatory Visit (INDEPENDENT_AMBULATORY_CARE_PROVIDER_SITE_OTHER): Payer: Self-pay | Admitting: Sports Medicine

## 2014-03-21 VITALS — BP 107/76 | HR 89 | Ht 66.0 in | Wt 186.0 lb

## 2014-03-21 DIAGNOSIS — M7730 Calcaneal spur, unspecified foot: Secondary | ICD-10-CM

## 2014-03-21 DIAGNOSIS — M25571 Pain in right ankle and joints of right foot: Secondary | ICD-10-CM

## 2014-03-21 DIAGNOSIS — M25572 Pain in left ankle and joints of left foot: Principal | ICD-10-CM

## 2014-03-21 DIAGNOSIS — M25579 Pain in unspecified ankle and joints of unspecified foot: Secondary | ICD-10-CM

## 2014-03-21 MED ORDER — MELOXICAM 15 MG PO TABS: ORAL_TABLET | ORAL | Status: DC

## 2014-03-21 NOTE — Assessment & Plan Note (Addendum)
Left ankle is significantly swollen, tender, and with effusion, right ankle is tender to palpation at the talocrural joint. Bilateral injection for diagnostic and therapeutic purposes, x-rays, physical therapy. Mobic. I think that the achiness in her legs is more likely due to diabetic peripheral neuropathy, management for this will be per PCP. Return for custom orthotics, Return to see me in a month.

## 2014-03-21 NOTE — Progress Notes (Addendum)
   Subjective:    I'm seeing this patient as a consultation for:  Dr. Madilyn Fireman  CC: Bilateral foot pain  HPI: This is a pleasant 58 year old female with diabetes mellitus type 2, who comes in with severe pain that she localizes in both ankles, worse on the left side with swelling and pain at the talocrural joint, she also has some numbness and tingling with an achy sensation in both legs in a stocking type distribution. Symptoms are moderate, persistent. Pain is worse with ambulation. She tells me that she does not really have much back pain.  Past medical history, Surgical history, Family history not pertinant except as noted below, Social history, Allergies, and medications have been entered into the medical record, reviewed, and no changes needed.   Review of Systems: No headache, visual changes, nausea, vomiting, diarrhea, constipation, dizziness, abdominal pain, skin rash, fevers, chills, night sweats, weight loss, swollen lymph nodes, body aches, joint swelling, muscle aches, chest pain, shortness of breath, mood changes, visual or auditory hallucinations.   Objective:   General: Well Developed, well nourished, and in no acute distress.  Neuro/Psych: Alert and oriented x3, extra-ocular muscles intact, able to move all 4 extremities, sensation grossly intact. Skin: Warm and dry, no rashes noted.  Respiratory: Not using accessory muscles, speaking in full sentences, trachea midline.  Cardiovascular: Pulses palpable, no extremity edema. Abdomen: Does not appear distended. Bilateral Ankle: Left ankle is swollen with tenderness over the talocrural joint, right ankle is less swollen, but still with tenderness at the talocrural joint. Range of motion is full in all directions. Strength is 5/5 in all directions. Stable lateral and medial ligaments; squeeze test and kleiger test unremarkable; Talar dome nontender; No pain at base of 5th MT; No tenderness over cuboid; No tenderness over N  spot or navicular prominence No tenderness on posterior aspects of lateral and medial malleolus No sign of peroneal tendon subluxations; Negative tarsal tunnel tinel's Able to walk 4 steps.  Impression and Recommendations:   This case required medical decision making of moderate complexity.

## 2014-04-04 ENCOUNTER — Encounter: Payer: Self-pay | Admitting: Physician Assistant

## 2014-04-04 ENCOUNTER — Ambulatory Visit (INDEPENDENT_AMBULATORY_CARE_PROVIDER_SITE_OTHER): Payer: Self-pay | Admitting: Physician Assistant

## 2014-04-04 VITALS — BP 121/81 | HR 84 | Ht 66.0 in | Wt 179.0 lb

## 2014-04-04 DIAGNOSIS — M79609 Pain in unspecified limb: Secondary | ICD-10-CM

## 2014-04-04 DIAGNOSIS — R7309 Other abnormal glucose: Secondary | ICD-10-CM

## 2014-04-04 DIAGNOSIS — R739 Hyperglycemia, unspecified: Secondary | ICD-10-CM

## 2014-04-04 DIAGNOSIS — IMO0002 Reserved for concepts with insufficient information to code with codable children: Secondary | ICD-10-CM

## 2014-04-04 DIAGNOSIS — E118 Type 2 diabetes mellitus with unspecified complications: Secondary | ICD-10-CM

## 2014-04-04 DIAGNOSIS — M79672 Pain in left foot: Secondary | ICD-10-CM

## 2014-04-04 DIAGNOSIS — E1165 Type 2 diabetes mellitus with hyperglycemia: Secondary | ICD-10-CM

## 2014-04-04 LAB — CBC WITH DIFFERENTIAL/PLATELET
BASOS ABS: 0.1 10*3/uL (ref 0.0–0.1)
Basophils Relative: 1 % (ref 0–1)
Eosinophils Absolute: 0.1 10*3/uL (ref 0.0–0.7)
Eosinophils Relative: 1 % (ref 0–5)
HCT: 43.1 % (ref 36.0–46.0)
Hemoglobin: 14.8 g/dL (ref 12.0–15.0)
LYMPHS PCT: 28 % (ref 12–46)
Lymphs Abs: 3.4 10*3/uL (ref 0.7–4.0)
MCH: 30 pg (ref 26.0–34.0)
MCHC: 34.3 g/dL (ref 30.0–36.0)
MCV: 87.2 fL (ref 78.0–100.0)
Monocytes Absolute: 0.7 10*3/uL (ref 0.1–1.0)
Monocytes Relative: 6 % (ref 3–12)
NEUTROS ABS: 7.9 10*3/uL — AB (ref 1.7–7.7)
NEUTROS PCT: 64 % (ref 43–77)
Platelets: 380 10*3/uL (ref 150–400)
RBC: 4.94 MIL/uL (ref 3.87–5.11)
RDW: 14.2 % (ref 11.5–15.5)
WBC: 12.3 10*3/uL — AB (ref 4.0–10.5)

## 2014-04-04 MED ORDER — INSULIN ASPART 100 UNIT/ML FLEXPEN: 4.0000 [IU] | PEN_INJECTOR | Freq: Three times a day (TID) | SUBCUTANEOUS | Status: DC

## 2014-04-04 MED ORDER — INSULIN GLARGINE 300 UNIT/ML ~~LOC~~ SOPN: 30.0000 [IU] | PEN_INJECTOR | Freq: Once | SUBCUTANEOUS | Status: DC

## 2014-04-04 NOTE — Patient Instructions (Addendum)
Switch to tojeou. Same units 30 at bedtime. Increase by 2 units every 5 days until fasting 120.   Add novolog 4 units at largest meal. If no hypoglyemia symptoms add 4 units to every meal up to 3 times a day.  Check sugars 2 hours after meal and before giving insulin at least once or twice a week.

## 2014-04-05 LAB — URIC ACID: Uric Acid, Serum: 3.7 mg/dL (ref 2.4–7.0)

## 2014-04-05 LAB — HEMOGLOBIN A1C
Hgb A1c MFr Bld: 10.9 % — ABNORMAL HIGH (ref <5.7)
MEAN PLASMA GLUCOSE: 266 mg/dL — AB (ref <117)

## 2014-04-05 LAB — COMPLETE METABOLIC PANEL WITH GFR
ALBUMIN: 3.8 g/dL (ref 3.5–5.2)
ALT: 28 U/L (ref 0–35)
AST: 23 U/L (ref 0–37)
Alkaline Phosphatase: 60 U/L (ref 39–117)
BUN: 26 mg/dL — AB (ref 6–23)
CALCIUM: 9.5 mg/dL (ref 8.4–10.5)
CHLORIDE: 104 meq/L (ref 96–112)
CO2: 24 meq/L (ref 19–32)
Creat: 1.03 mg/dL (ref 0.50–1.10)
GFR, EST AFRICAN AMERICAN: 69 mL/min
GFR, EST NON AFRICAN AMERICAN: 60 mL/min
GLUCOSE: 221 mg/dL — AB (ref 70–99)
POTASSIUM: 4.5 meq/L (ref 3.5–5.3)
Sodium: 138 mEq/L (ref 135–145)
Total Bilirubin: 0.6 mg/dL (ref 0.2–1.2)
Total Protein: 6.7 g/dL (ref 6.0–8.3)

## 2014-04-08 ENCOUNTER — Other Ambulatory Visit: Payer: Self-pay | Admitting: Family Medicine

## 2014-04-08 ENCOUNTER — Other Ambulatory Visit: Payer: Self-pay | Admitting: Physician Assistant

## 2014-04-08 DIAGNOSIS — M79672 Pain in left foot: Secondary | ICD-10-CM

## 2014-04-08 NOTE — Progress Notes (Signed)
   Subjective:    Patient ID: Erin [name], female    DOB: [birth year], 58 y.o.   MRN: [id number]  HPI Pt presents to the clinic to follow up after ER visit on 04/03/14. She was seen for glucose that had risen above 600 on home glucometer. She had seen UC on 04/02/14 for suspected gout in left foot. No testing was done but given colchine and prednisone. Prednisone made her sugars very elevated. WBC was 11.6, glucose was 503 and SCr was 1.01. She was give fluids, short acting insulin, and shot of toradaol.   She presents to the clinic today feeling better. She wants to get her sugars more under control. She does not have insurance and it makes it difficult to afford. She did not tolerated victoza, faraxga, or metformin. She currently takes lantus 30units at bedtime and glipizide 10mg  bid. She denies any hypoglycemia events. Her sugars fasting range from 177-300.  She has not been checking at meal time.   Her left foot does seem to be feeling better. Denies any trauma or injury. Currently not treating with any medication.   Review of Systems  All other systems reviewed and are negative.      Objective:   Physical Exam  Constitutional: She is oriented to person, place, and time. She appears well-developed and well-nourished.  HENT:  Head: Normocephalic and atraumatic.  Cardiovascular: Normal rate, regular rhythm and normal heart sounds.   Pulmonary/Chest: Effort normal and breath sounds normal.  Musculoskeletal:  Left foot- no swelling or bruising. No pain with palpation over great toe or anterior foot.  ROM normal.  Strength 5/5.   Neurological: She is alert and oriented to person, place, and time.  Skin: Skin is dry.  Psychiatric: She has a normal mood and affect. Her behavior is normal.          Assessment & Plan:  DM, uncontrolled- added prednisone to intolerance list.    Lab Results  Component Value Date   HGBA1C 10.9* 04/04/2014   Better than last a1c, 3 months ago.    Cbc and cmp ordered to follow up from hospital.  Switched pt from lantus to toujeo unit per unit. 30units at bedtime. toujeo should be cheaper for pt and better. Introduced pt to Data processing manager.  Pt aware to increase by 2 units every 5 days until gets to fasting glucose of 120. Sample pen was given to try.   Pt was also given novolog for short acting insulin would like for pt to start with 4 units at largest meal. If sugars not continuing to come down at fasting and not having hypoglyemia symptoms increase to every meal. Pt concerned about checking glucose at every meal and cost. Discussed does not have to check at every meal since taking only 4 units. I did encourage her to check occasionally before meal and then 2 hours after meal and record.   Continue glipizide.   Follow up in 4-6 weeks with Dr. Madilyn Fireman and show her glucose log.    Left foot pain- cannot completley exclude peripheral neuropathy as some of pain or arthritis. Not completely confident was gout. Will check uric acid levels. Consider xray if pain persist. Continue to use NSAIDs as needed.

## 2014-04-09 ENCOUNTER — Telehealth: Payer: Self-pay

## 2014-04-09 ENCOUNTER — Ambulatory Visit (INDEPENDENT_AMBULATORY_CARE_PROVIDER_SITE_OTHER): Payer: Self-pay

## 2014-04-09 DIAGNOSIS — M7730 Calcaneal spur, unspecified foot: Secondary | ICD-10-CM

## 2014-04-09 DIAGNOSIS — M79672 Pain in left foot: Secondary | ICD-10-CM

## 2014-04-09 DIAGNOSIS — M2010 Hallux valgus (acquired), unspecified foot: Secondary | ICD-10-CM

## 2014-04-09 NOTE — Telephone Encounter (Signed)
Tamari walked into the office.  Erin [name] reports the ibuprofen and the mobic did not help with the pain. She did have Oxycodone about a month ago and states this medication did help with the pain. I advised her we would wait for the Xray results. She agreed to go home and wait for a return call.

## 2014-04-11 ENCOUNTER — Other Ambulatory Visit: Payer: Self-pay

## 2014-04-11 MED ORDER — HYDROCODONE-ACETAMINOPHEN 7.5-325 MG PO TABS: 1.0000 | ORAL_TABLET | Freq: Three times a day (TID) | ORAL | Status: DC | PRN

## 2014-04-21 ENCOUNTER — Ambulatory Visit: Payer: Self-pay | Admitting: Sports Medicine

## 2014-04-22 ENCOUNTER — Ambulatory Visit: Payer: Self-pay | Admitting: Family Medicine

## 2014-05-01 ENCOUNTER — Telehealth: Payer: Self-pay

## 2014-05-01 NOTE — Telephone Encounter (Signed)
Erin [name] reports her blood sugar is not controlled. I asked her read the values from her meter. She did not have meter with her, but reports her blood sugar has been up to 300 mg/dl. I asked how she is taking her Novolog and Toujeo. She states she is taking Novolog 8 units once daily and Toujeo 22 units at bed time. I advised her the prescriptions read Novolog 4 units before each meal 3 times a day and Toujeo 30 units once daily and increase by 2 units every 5 days until fasting blood sugar is 120. She states she will start taking her insulin as directed on the directions. She states she was unaware of directions.   Brent also reports bilateral leg pain and was very weepy during this conversation. I advised her I could schedule her a follow up with Dr Madilyn Fireman, per Dr Landry Corporal last note, to discuss possible diabetic peripheral neuropathy.

## 2014-05-01 NOTE — Telephone Encounter (Signed)
Left detailed message.   

## 2014-05-01 NOTE — Telephone Encounter (Signed)
Erin [name] returned call and it was explained to Erin [name] that she is to increase Erin [name] Toujeo and use the Novolog prior to each meal. She verbalized understanding. Margette Fast, CMA

## 2014-05-01 NOTE — Telephone Encounter (Signed)
I agree. Have her increase her Tujeo from 22 units to 26 units. She can stay at back until I see her on Monday. Also encouraged her to use her NovoLog before each meal if she is able to. That way we can see if her sugars are coming down over the weekend.

## 2014-05-02 ENCOUNTER — Telehealth: Payer: Self-pay | Admitting: Emergency Medicine

## 2014-05-05 ENCOUNTER — Ambulatory Visit: Payer: Self-pay | Admitting: Family Medicine

## 2014-05-14 ENCOUNTER — Other Ambulatory Visit: Payer: Self-pay | Admitting: Family Medicine

## 2014-05-15 ENCOUNTER — Other Ambulatory Visit: Payer: Self-pay | Admitting: *Deleted

## 2014-05-15 MED ORDER — HYDROCODONE-ACETAMINOPHEN 7.5-325 MG PO TABS: 1.0000 | ORAL_TABLET | Freq: Three times a day (TID) | ORAL | Status: DC | PRN

## 2014-05-15 NOTE — Telephone Encounter (Signed)
Jayla called for refill of her hydrocodone. Margette Fast, CMA

## 2014-05-16 ENCOUNTER — Telehealth: Payer: Self-pay | Admitting: *Deleted

## 2014-05-16 NOTE — Telephone Encounter (Signed)
Baylen called and was made aware of pain script at front desk ready for pick up. Margette Fast, CMA

## 2014-06-12 ENCOUNTER — Ambulatory Visit (INDEPENDENT_AMBULATORY_CARE_PROVIDER_SITE_OTHER): Payer: Self-pay | Admitting: Family Medicine

## 2014-06-12 ENCOUNTER — Encounter: Payer: Self-pay | Admitting: Family Medicine

## 2014-06-12 ENCOUNTER — Ambulatory Visit (INDEPENDENT_AMBULATORY_CARE_PROVIDER_SITE_OTHER): Payer: Self-pay | Admitting: *Deleted

## 2014-06-12 VITALS — BP 134/92 | HR 81 | Wt 185.0 lb

## 2014-06-12 DIAGNOSIS — M791 Myalgia: Secondary | ICD-10-CM

## 2014-06-12 DIAGNOSIS — G629 Polyneuropathy, unspecified: Secondary | ICD-10-CM

## 2014-06-12 DIAGNOSIS — M546 Pain in thoracic spine: Secondary | ICD-10-CM

## 2014-06-12 DIAGNOSIS — M7918 Myalgia, other site: Secondary | ICD-10-CM

## 2014-06-12 DIAGNOSIS — Z8719 Personal history of other diseases of the digestive system: Secondary | ICD-10-CM

## 2014-06-12 DIAGNOSIS — E1142 Type 2 diabetes mellitus with diabetic polyneuropathy: Secondary | ICD-10-CM

## 2014-06-12 DIAGNOSIS — Z23 Encounter for immunization: Secondary | ICD-10-CM

## 2014-06-12 DIAGNOSIS — K8500 Idiopathic acute pancreatitis without necrosis or infection: Secondary | ICD-10-CM

## 2014-06-12 DIAGNOSIS — IMO0002 Reserved for concepts with insufficient information to code with codable children: Secondary | ICD-10-CM

## 2014-06-12 DIAGNOSIS — M549 Dorsalgia, unspecified: Secondary | ICD-10-CM

## 2014-06-12 DIAGNOSIS — E118 Type 2 diabetes mellitus with unspecified complications: Secondary | ICD-10-CM

## 2014-06-12 DIAGNOSIS — E1165 Type 2 diabetes mellitus with hyperglycemia: Secondary | ICD-10-CM

## 2014-06-12 MED ORDER — DULOXETINE HCL 30 MG PO CPEP: 30.0000 mg | ORAL_CAPSULE | Freq: Every day | ORAL | Status: DC

## 2014-06-12 MED ORDER — HYDROCODONE-ACETAMINOPHEN 7.5-325 MG PO TABS: 1.0000 | ORAL_TABLET | Freq: Two times a day (BID) | ORAL | Status: DC | PRN

## 2014-06-12 NOTE — Patient Instructions (Signed)
Increase Toujeo 30 units once a day.    Increase the Novolog 8  units with each meal.   We will recheck your pancreas enzymes today.   Try the cymbalta for your pain.   Use your heating pad for 10 minutes twice a day. After applying heat please try to do the stretches on the sheet provided.

## 2014-06-12 NOTE — Progress Notes (Signed)
Subjective:    Patient ID: Erin [name], female    DOB: 1956-05-01, 58 y.o.   MRN: [id number]  HPI Back pain - Starts in her upper back, worse on the right.  Wraps around her underarm on the right.  Feels sore like someone hit her.  Keeps her up all night.  Starting to get burning in her palms.  She takes IBU, aleve, Advil and uses the hydrocodone.  She usually takes it BID.  Still has bilateral feet and leg pain.  She rode in a [place] on the beach in May and it has been getting progressively worse since then.  She is getting some numbness in her buttocks.  Getting readiating pain into her legs and feet.  Occ getting sharp pain in her vagina and rectum.  She is noticing her toes are locking up. Also having sciatica on the right side. Feels better to keep moving.    Diabetes - she has been checking her blood sugars at home. Mostly been running in the upper 100s to 200s and a couple in the 300 range.She says one day she made some homemade chicken noodle soup and her blood sugar was actually 100 that day. She is currently taking 26 units of Toujeo. She's using 6 units of NovoLog with meals. She says she hasn't noticed much of a difference after increasing her NovoLog to 6.  Say was also in the hospital, for [facility] in September for pancreatitis. She says her mother had problems with her pancreas and wants [place] to follow this periodically. She says it worries her and concerns her.she went to [facility] because she felt numb all over. She really was not having any significant abdominal pain or nausea but evidently her pancreatic enzymes were quite elevated.   Review of Systems     Objective:   Physical Exam  Constitutional: She is oriented to person, place, and time. She appears well-developed and well-nourished.  HENT:  Head: Normocephalic and atraumatic.  Cardiovascular: Normal rate, regular rhythm and normal heart sounds.   Pulmonary/Chest: Effort normal and breath sounds  normal.  Musculoskeletal:  Neck with NROM. Mildly tender over the mid thoracic spine. She is tender over the rhomboids bilaterally between the spine in the scapula. Nontender of the lumbar spine today. Her sciatica has been bothering her as well. Normal lumbar flexion, extension, rotation. Patellar reflexes 2+ bilaterally. Negative straight leg raise today. Externally strength out of 5 bilaterally.  Neurological: She is alert and oriented to person, place, and time.  Skin: Skin is warm and dry.  Psychiatric: She has a normal mood and affect. Her behavior is normal.          Assessment & Plan:  Diabetes-increase Toujeo to 30 units once a day. Increase NovoLog for mealtime from 6 units to 8 units. Follow-up in 6 weeks.call if blood pressures are still elevated. Continue work on diet, regular exercise and weight loss.  Upper back pain-handout given on exercises to do on her own at home. Recommend heating pad as well. Will put her on Cymbalta. Will refill her hydrocodone for twice a day.I would like to see her back in 6 weeks to make sure that she is improving. She really doesn't have a lot of tender points on exam today so suggest fibromyalgia. But she does have a lot of myofascial complaints. I think she would still be a good candidate for Cymbalta and Also help with her depressed mood.  Peripheral neuropathy-I am concerned  that she is now starting to get some numbness and feeling in her hands as well.  Acute pancreatitis-we'll recheck enzymes today to make sure they are back at baseline. [facility] [place] [facility] recent hospitalization in September. to get a copy of her medical records.

## 2014-06-13 LAB — LIPASE: Lipase: 21 U/L (ref 0–75)

## 2014-06-13 LAB — AMYLASE: Amylase: 30 U/L (ref 0–105)

## 2014-06-18 ENCOUNTER — Other Ambulatory Visit: Payer: Self-pay | Admitting: Family Medicine

## 2014-06-23 ENCOUNTER — Other Ambulatory Visit: Payer: Self-pay | Admitting: Family Medicine

## 2014-06-30 ENCOUNTER — Telehealth: Payer: Self-pay | Admitting: *Deleted

## 2014-06-30 MED ORDER — FLUCONAZOLE 150 MG PO TABS: 150.0000 mg | ORAL_TABLET | Freq: Every day | ORAL | Status: DC

## 2014-06-30 NOTE — Telephone Encounter (Signed)
Erin [name] called and said that she has another yeast infection. I asked if she was having any discharge and she said that she is having discharge white in color. She said that her BS are in the 200's and this is a typical yeast infection you have treated in the past. She is asking for medication and is not able to come in for an office or nurse visit. Please advise. Margette Fast, CMA

## 2014-06-30 NOTE — Telephone Encounter (Signed)
Prescription sent. She needs to really work on getting her sugars down or make it difficult to clear the infection. If she's not improving and will need to come in for a vaginal swab.

## 2014-06-30 NOTE — Telephone Encounter (Signed)
Kasen aware. Margette Fast, CMA

## 2014-07-09 ENCOUNTER — Telehealth: Payer: Self-pay

## 2014-07-09 NOTE — Telephone Encounter (Signed)
Deandrea would like a refill on the hydrocodone. Is this a long term medication? Please advise. Refill due on 07/12/2014.

## 2014-07-10 MED ORDER — HYDROCODONE-ACETAMINOPHEN 7.5-325 MG PO TABS: 1.0000 | ORAL_TABLET | Freq: Two times a day (BID) | ORAL | Status: DC | PRN

## 2014-07-10 NOTE — Telephone Encounter (Signed)
Patient advised.

## 2014-07-10 NOTE — Telephone Encounter (Signed)
Ok to fill. She does need to sign a pain contract at next ov

## 2014-07-25 ENCOUNTER — Ambulatory Visit: Payer: Self-pay | Admitting: Physician Assistant

## 2014-07-25 ENCOUNTER — Other Ambulatory Visit: Payer: Self-pay

## 2014-07-25 MED ORDER — ALPRAZOLAM 0.5 MG PO TABS
0.5000 mg | ORAL_TABLET | Freq: Two times a day (BID) | ORAL | Status: DC | PRN
Start: 2014-07-25 — End: 2014-08-26

## 2014-07-29 ENCOUNTER — Telehealth: Payer: Self-pay

## 2014-07-29 ENCOUNTER — Other Ambulatory Visit: Payer: Self-pay | Admitting: Family Medicine

## 2014-07-29 DIAGNOSIS — M549 Dorsalgia, unspecified: Secondary | ICD-10-CM

## 2014-07-29 NOTE — Telephone Encounter (Signed)
[name] called to ask if it was time for her pain medication refill. I advised her a refill of the Hydrocodone is not due until January 3 rd. She reports being in a lot of pain and she took more than 1 twice daily as prescribed. She has been out for 3 days. Based on the information she has provided; she is taking 4 tablets daily. I advised her the prescription was for 30 days and to take it 1 tablet twice daily. She started crying and talking about being in pain. I offered her an office visit. She declined.

## 2014-08-04 MED ORDER — DULOXETINE HCL 60 MG PO CPEP: 60.0000 mg | ORAL_CAPSULE | Freq: Every day | ORAL | Status: DC

## 2014-08-04 NOTE — Telephone Encounter (Signed)
However like to refer her to orthopedist for her back. Referral placed. I'm also going to increase the cymbalta to 60mg  since has been on it for a month to help with back pain.

## 2014-08-04 NOTE — Telephone Encounter (Signed)
Mrs Jamerson states she has leg and feet pain not upper back pain. She states she stopped taking the Cymbalta because of the cost. $200 a month. She says she will have insurance in January.

## 2014-08-06 ENCOUNTER — Telehealth: Payer: Self-pay

## 2014-08-06 DIAGNOSIS — M79605 Pain in left leg: Principal | ICD-10-CM

## 2014-08-06 DIAGNOSIS — M79604 Pain in right leg: Secondary | ICD-10-CM

## 2014-08-06 MED ORDER — HYDROCODONE-ACETAMINOPHEN 7.5-325 MG PO TABS: 1.0000 | ORAL_TABLET | Freq: Two times a day (BID) | ORAL | Status: DC | PRN

## 2014-08-06 NOTE — Telephone Encounter (Signed)
Left message for patient to return call.

## 2014-08-06 NOTE — Telephone Encounter (Signed)
Erin [name] states she has foot and leg pain from the spurs in her feet. She states doesn't need to see Ortho for low back pain. She would like to have her hydrocodone refilled 5 days early. Please advise.

## 2014-08-06 NOTE — Telephone Encounter (Signed)
Ok we can change referral to leg pain instead of back pain.  Is she icing her feet adn doing the stretches?  A sterooid shot in the foot could be very helpful. We don't usually use narcotics for heel spurs.  Ok to fill the medication 5 days early but need to keep appt with ortho

## 2014-08-07 ENCOUNTER — Telehealth: Payer: Self-pay

## 2014-08-07 NOTE — Telephone Encounter (Signed)
Patient agreed to see Ortho for bilateral leg pain. She is aware to pick up prescription after 1:30 today.

## 2014-08-07 NOTE — Telephone Encounter (Signed)
Erin [name], the pharmacist at [facility], called to ask if it was ok to refill her Hydrocodone early. I advised yes it is ok to fill early. He reports she told him she needed it early because she was leaving town.

## 2014-08-13 ENCOUNTER — Other Ambulatory Visit: Payer: Self-pay | Admitting: Family Medicine

## 2014-08-26 ENCOUNTER — Other Ambulatory Visit: Payer: Self-pay | Admitting: Physician Assistant

## 2014-08-27 ENCOUNTER — Other Ambulatory Visit: Payer: Self-pay | Admitting: Physician Assistant

## 2014-08-28 ENCOUNTER — Telehealth: Payer: Self-pay

## 2014-08-28 NOTE — Telephone Encounter (Signed)
Erin [name], Patient called and left a message wanting a refill on Xanax. In the chart the Xanax was refilled on 08/26/2014. Would you call the pharmacy to verify that they did receive the refill. Thank you.

## 2014-08-28 NOTE — Telephone Encounter (Signed)
Erin [name] was called and pharmacy notified of fill date of 08/26/14.

## 2014-09-01 ENCOUNTER — Ambulatory Visit: Payer: Self-pay | Admitting: Family Medicine

## 2014-09-02 ENCOUNTER — Other Ambulatory Visit: Payer: Self-pay

## 2014-09-02 MED ORDER — HYDROCODONE-ACETAMINOPHEN 7.5-325 MG PO TABS: 1.0000 | ORAL_TABLET | Freq: Two times a day (BID) | ORAL | Status: DC | PRN

## 2014-09-02 NOTE — Telephone Encounter (Signed)
I advised patient this will be the last refill for hydrocodone. She has been referred.

## 2014-09-04 ENCOUNTER — Other Ambulatory Visit: Payer: Self-pay

## 2014-09-04 MED ORDER — HYDROCODONE-ACETAMINOPHEN 7.5-325 MG PO TABS: 1.0000 | ORAL_TABLET | Freq: Two times a day (BID) | ORAL | Status: DC | PRN

## 2014-09-05 ENCOUNTER — Other Ambulatory Visit: Payer: Self-pay | Admitting: Family Medicine

## 2014-09-22 ENCOUNTER — Ambulatory Visit: Payer: Self-pay | Admitting: Sports Medicine

## 2014-09-22 ENCOUNTER — Ambulatory Visit: Payer: Self-pay | Admitting: Family Medicine

## 2014-09-23 ENCOUNTER — Other Ambulatory Visit: Payer: Self-pay | Admitting: Physician Assistant

## 2014-09-24 ENCOUNTER — Other Ambulatory Visit: Payer: Self-pay | Admitting: Family Medicine

## 2014-09-26 ENCOUNTER — Other Ambulatory Visit: Payer: Self-pay | Admitting: Physician Assistant

## 2014-09-26 MED ORDER — ATENOLOL 50 MG PO TABS: 50.0000 mg | ORAL_TABLET | Freq: Every day | ORAL | Status: DC

## 2014-09-26 MED ORDER — AMLODIPINE BESYLATE 5 MG PO TABS: 5.0000 mg | ORAL_TABLET | Freq: Every day | ORAL | Status: DC

## 2014-09-26 MED ORDER — LISINOPRIL 40 MG PO TABS: ORAL_TABLET | ORAL | Status: DC

## 2014-09-27 ENCOUNTER — Other Ambulatory Visit: Payer: Self-pay | Admitting: Family Medicine

## 2014-10-02 ENCOUNTER — Encounter: Payer: Self-pay | Admitting: Family Medicine

## 2014-10-02 ENCOUNTER — Ambulatory Visit (INDEPENDENT_AMBULATORY_CARE_PROVIDER_SITE_OTHER): Payer: No Typology Code available for payment source | Admitting: Family Medicine

## 2014-10-02 VITALS — BP 124/79 | HR 75 | Wt 192.0 lb

## 2014-10-02 DIAGNOSIS — M546 Pain in thoracic spine: Secondary | ICD-10-CM

## 2014-10-02 DIAGNOSIS — E1142 Type 2 diabetes mellitus with diabetic polyneuropathy: Secondary | ICD-10-CM

## 2014-10-02 DIAGNOSIS — IMO0002 Reserved for concepts with insufficient information to code with codable children: Secondary | ICD-10-CM

## 2014-10-02 DIAGNOSIS — E1165 Type 2 diabetes mellitus with hyperglycemia: Secondary | ICD-10-CM

## 2014-10-02 DIAGNOSIS — M549 Dorsalgia, unspecified: Secondary | ICD-10-CM

## 2014-10-02 DIAGNOSIS — E118 Type 2 diabetes mellitus with unspecified complications: Secondary | ICD-10-CM

## 2014-10-02 LAB — POCT UA - MICROALBUMIN
Creatinine, POC: 50 mg/dL
MICROALBUMIN (UR) POC: 10 mg/L

## 2014-10-02 LAB — POCT GLYCOSYLATED HEMOGLOBIN (HGB A1C): Hemoglobin A1C: 10.8

## 2014-10-02 MED ORDER — PREGABALIN 50 MG PO CAPS: 50.0000 mg | ORAL_CAPSULE | Freq: Three times a day (TID) | ORAL | Status: DC

## 2014-10-02 MED ORDER — OXYCODONE-ACETAMINOPHEN 10-325 MG PO TABS: 1.0000 | ORAL_TABLET | Freq: Three times a day (TID) | ORAL | Status: DC | PRN

## 2014-10-02 NOTE — Progress Notes (Signed)
   Subjective:    Patient ID: Erin [name], female    DOB: [birth year], 59 y.o.   MRN: [id number]  HPI Diabetes - no hypoglycemic events. No wounds or sores that are not healing well. No increased thirst or urination. Checking glucose at home. Taking medications as prescribed without any side effects. She is on Toujeo and meatltime. She is not using NovoLog because she was unable to afford it. On Toujeo she only went up to 35 units then stop because she was scared to go above that. Her blood sugars have been running in the 200s to 300s at home.  Occ getting sharp pain in her breast that last seconds.  Started before her last mammo and it was normal.  No exacerbating or alleviating factors. Just happens randomly. Just a couple times a month.  She is complaining about the severity of her pain for her neuropathy today. She did not go for her appointment for her generalized leg pain in December to the orthopedist. Erin [name] asked referred her to neurology specifically for the neuropathy last breathing she did not make that either. She now has insurance again and says she's okay with getting back in with the orthopedist. She wants to reconsider trying Lyrica again. We have prescribed it at one point in time that it was too expensive. She has tried and failed gabapentin in the past.  Review of Systems     Objective:   Physical Exam  Constitutional: She is oriented to person, place, and time. She appears well-developed and well-nourished.  HENT:  Head: Normocephalic and atraumatic.  Cardiovascular: Normal rate, regular rhythm and normal heart sounds.   Pulmonary/Chest: Effort normal and breath sounds normal.  Neurological: She is alert and oriented to person, place, and time.  Skin: Skin is warm and dry.  Psychiatric: She has a normal mood and affect. Her behavior is normal.          Assessment & Plan:  DM- discussed increasing the toujeo  Until sugars are under better control. I want her to  increase 1 unit every other day. I want to be a little bit more aggressive and try to get her levels up. I will see her back in 3 weeks to make sure that she is making some progress with her blood sugars.   Neuropathy - once again had a discussion with her about getting her diabetes under better control to help with her neuropathy. If she does not she is to make treating her diabetes and priority her neuropathy will never get better and in fact will continue to get worse. She was also supposed to see an orthopedist back in December but never followed up because she did not have insurance at that time and was unable to go. She now has insurance again so we will need to place a new referral for an orthopedist. We also referred her to neurology back in the spring. She did not go at that time either. She has tried gabapentin in the past and did not do well with it. We had tried prescribing Lyrica at one point but it was too pricey. We'll try prescribing it again today to see if now with her new insurance and now that the medication is generic if she will be able to get it. Lab Results  Component Value Date   HGBA1C 10.8 10/02/2014   Upper back pain-we will place new referral for orthopedist now that she has insurance again.

## 2014-10-02 NOTE — Patient Instructions (Signed)
Start 34 units tonight on the Toujeo Increase by 1 unit every other night until sugars are under 130 in the morning.

## 2014-10-03 ENCOUNTER — Encounter: Payer: Self-pay | Admitting: Family Medicine

## 2014-10-03 DIAGNOSIS — E1142 Type 2 diabetes mellitus with diabetic polyneuropathy: Secondary | ICD-10-CM | POA: Insufficient documentation

## 2014-10-14 ENCOUNTER — Other Ambulatory Visit: Payer: Self-pay | Admitting: Family Medicine

## 2014-10-22 ENCOUNTER — Telehealth: Payer: Self-pay

## 2014-10-22 NOTE — Telephone Encounter (Signed)
Erin [name] called and asked for a refill on the oxycodone. I advised her that is has only been 2.5 weeks since her last refill. Also she was instructed to follow up with ortho. She states she will talk with Dr Madilyn Fireman tomorrow at her appointment.

## 2014-10-23 ENCOUNTER — Encounter: Payer: Self-pay | Admitting: Family Medicine

## 2014-10-23 ENCOUNTER — Ambulatory Visit (INDEPENDENT_AMBULATORY_CARE_PROVIDER_SITE_OTHER): Payer: No Typology Code available for payment source | Admitting: Family Medicine

## 2014-10-23 VITALS — BP 118/81 | HR 97 | Wt 181.0 lb

## 2014-10-23 DIAGNOSIS — M722 Plantar fascial fibromatosis: Secondary | ICD-10-CM

## 2014-10-23 DIAGNOSIS — E118 Type 2 diabetes mellitus with unspecified complications: Secondary | ICD-10-CM

## 2014-10-23 DIAGNOSIS — E1165 Type 2 diabetes mellitus with hyperglycemia: Secondary | ICD-10-CM

## 2014-10-23 DIAGNOSIS — IMO0002 Reserved for concepts with insufficient information to code with codable children: Secondary | ICD-10-CM

## 2014-10-23 DIAGNOSIS — E785 Hyperlipidemia, unspecified: Secondary | ICD-10-CM

## 2014-10-23 DIAGNOSIS — I10 Essential (primary) hypertension: Secondary | ICD-10-CM

## 2014-10-23 DIAGNOSIS — E1142 Type 2 diabetes mellitus with diabetic polyneuropathy: Secondary | ICD-10-CM

## 2014-10-23 MED ORDER — PREGABALIN 75 MG PO CAPS: 75.0000 mg | ORAL_CAPSULE | Freq: Three times a day (TID) | ORAL | Status: DC

## 2014-10-23 MED ORDER — OXYCODONE-ACETAMINOPHEN 10-325 MG PO TABS: 1.0000 | ORAL_TABLET | Freq: Three times a day (TID) | ORAL | Status: DC | PRN

## 2014-10-23 MED ORDER — ALPRAZOLAM 0.5 MG PO TABS: 0.5000 mg | ORAL_TABLET | Freq: Two times a day (BID) | ORAL | Status: DC | PRN

## 2014-10-23 MED ORDER — ATENOLOL 50 MG PO TABS: 50.0000 mg | ORAL_TABLET | Freq: Every day | ORAL | Status: DC

## 2014-10-23 NOTE — Patient Instructions (Signed)
Increase the toujeo to 50 units. Stay at that dose for 5 days.  If still in the 200s after 5 days then go up by 1 unit every 2 nights.

## 2014-10-23 NOTE — Progress Notes (Signed)
   Subjective:    Patient ID: Erin [name], female    DOB: 1956-01-03, 59 y.o.   MRN: [id number]  HPI Diabetes - no hypoglycemic events. No wounds or sores that are not healing well. No increased thirst or urination. Checking glucose at home. Taking medications as prescribed without any side effects. Brought in her home monitor today. The last 2 days her sugars have been running in the 200s fasting. Previously they were mostly running in the 300s and upper 200s. She is now up to 42 units a day.  Peripheral neuropathy - She feels the Lyrica is helping.  She is taking it 3 times a day.  She feels like the stinging is better.  The numbness is better.   Hypertension- Pt denies chest pain, SOB, dizziness, or heart palpitations.  Taking meds as directed w/o problems.  Denies medication side effects.  She does need refills on the atenolol today.  She still having a lot of pain in the bottoms of her feet. It's worse when she first gets up in the morning. She says when she is up on her feet a little longer actually gets little bit better. When she first puts pressure on the mid feels like she's walking on glass. The area of pain is mostly over the bottom of the heel and into her arch. Is affecting both feet. She had x-rays done back in September which did show plantar heel spurs bilaterally.  Review of Systems     Objective:   Physical Exam  Constitutional: She is oriented to person, place, and time. She appears well-developed and well-nourished.  HENT:  Head: Normocephalic and atraumatic.  Cardiovascular: Normal rate, regular rhythm and normal heart sounds.   Pulmonary/Chest: Effort normal and breath sounds normal.  Musculoskeletal:  Tender over both heels. nontender over the arch.  Ankles with NROM.  No inc laxity.  DP pulses 2+ bilat.   Neurological: She is alert and oriented to person, place, and time.  Skin: Skin is warm and dry.  Psychiatric: She has a normal mood and affect. Her  behavior is normal.          Assessment & Plan:  Diabetes-up in 6 weeks for repeat hemoglobin A1c. She did bring her glucometer in with her. Most of the sugars are still running in the 200s. But this is improved from running in the 300s. We'll go ahead and increase her Toujeo to 50 units. Encouraged her to stay at that dose for 5 days. After that if her sugars are still running in the 200s I want her to go up by 1 unit every other day. Sample provided today.  Peripheral neuropathy-we could certainly try going up on the dose of Lyrica to 75 mg 3 times a day and see if this is helpful. Follow-up in 8 weeks.  Plantar fasciitis-discussed diagnosis and treatment area given handout on exercises to do on her own at home. Recommend that she use a tennis ball on the morning to massage the area before she gets up out of bed. Recommend that she ices them with a frozen water bottle in the evenings.  Hypertension-well-controlled. Refilled her atenolol today for 6 months.

## 2014-11-10 ENCOUNTER — Other Ambulatory Visit: Payer: Self-pay | Admitting: *Deleted

## 2014-11-10 ENCOUNTER — Telehealth: Payer: Self-pay | Admitting: *Deleted

## 2014-11-10 MED ORDER — AMBULATORY NON FORMULARY MEDICATION: Status: DC

## 2014-11-10 MED ORDER — FLUCONAZOLE 150 MG PO TABS: 150.0000 mg | ORAL_TABLET | Freq: Every day | ORAL | Status: DC

## 2014-11-10 NOTE — Telephone Encounter (Signed)
Pt called this morning needing a refill on her test strips but also wanted you to know that her fasting sugars are still running in the high 300s.

## 2014-11-10 NOTE — Telephone Encounter (Signed)
How many units of Toujeo is she using. We do need to adjust her regimen.

## 2014-11-11 NOTE — Telephone Encounter (Signed)
Pt states she's up to 502 units.

## 2014-11-12 ENCOUNTER — Telehealth: Payer: Self-pay | Admitting: Family Medicine

## 2014-11-12 ENCOUNTER — Other Ambulatory Visit: Payer: Self-pay | Admitting: *Deleted

## 2014-11-12 MED ORDER — AMLODIPINE BESYLATE 5 MG PO TABS: 5.0000 mg | ORAL_TABLET | Freq: Every day | ORAL | Status: DC

## 2014-11-12 MED ORDER — AMBULATORY NON FORMULARY MEDICATION: Status: AC

## 2014-11-12 MED ORDER — LISINOPRIL 40 MG PO TABS: ORAL_TABLET | ORAL | Status: DC

## 2014-11-12 NOTE — Telephone Encounter (Signed)
Pt notified of dosing instructions.  Sent over rx for a new meter & test strips to [facility] in [place].

## 2014-11-12 NOTE — Telephone Encounter (Signed)
Increased 55 units tonight and then increase by 1 unit every other day until fasting blood sugar is under 130. When she gets to that dose stay at home that regimen until she sees me back.

## 2014-11-12 NOTE — Telephone Encounter (Signed)
Rxs sent.  Pt notified.

## 2014-11-12 NOTE — Telephone Encounter (Signed)
Pt called. Her bp is high and she wants refill on her meds. Thank you.

## 2014-11-14 ENCOUNTER — Telehealth: Payer: Self-pay

## 2014-11-14 NOTE — Telephone Encounter (Signed)
Erin [name] called and reports the meter we sent in is not covered by insurance. She would like [place] to send in another meter. I advised her to call her insurance company to find out which meter is covered and to call us back.

## 2014-11-17 ENCOUNTER — Telehealth: Payer: Self-pay | Admitting: Family Medicine

## 2014-11-18 ENCOUNTER — Other Ambulatory Visit: Payer: Self-pay | Admitting: *Deleted

## 2014-11-18 ENCOUNTER — Other Ambulatory Visit: Payer: Self-pay | Admitting: Family Medicine

## 2014-11-18 MED ORDER — HYDROCODONE-ACETAMINOPHEN 7.5-325 MG PO TABS: 1.0000 | ORAL_TABLET | Freq: Two times a day (BID) | ORAL | Status: DC | PRN

## 2014-11-19 ENCOUNTER — Telehealth: Payer: Self-pay | Admitting: *Deleted

## 2014-11-19 DIAGNOSIS — G629 Polyneuropathy, unspecified: Secondary | ICD-10-CM

## 2014-11-19 NOTE — Telephone Encounter (Signed)
Spoke with pt while she was here to let me check her BP & notified her of recommendations.  She would like to see a neuro here in [place] if it's possible.  She also stated that since increasing the Lyrica she is having bad night sweats every night.  However, it has been helping.

## 2014-11-19 NOTE — Telephone Encounter (Signed)
How have her blood sugars been doing. Hopefully they're looking better which will help with the pain in her legs. We could also consider referral to neurology for further evaluation to see if the pain is from a neuropathy or possibly coming from another location such as her back. If she would like me to schedule referral then please let me know.

## 2014-11-19 NOTE — Telephone Encounter (Signed)
Thank you. Okay to place referral for neurology. Thank you. Use diagnosis code peripheral neuropathy. Was her blood pressure okay?

## 2014-11-19 NOTE — Telephone Encounter (Signed)
Spoke with pt today about her BP.  She's went to the dentist twice to have a tooth pulled & they've not done it due to her bp being high.  She was out of her bp med for about a week so maybe that is the culprit.  She is coming today to pick up her hydrocodone so I told her I'd check it really quick for her.  She also mentioned that her legs are starting to hurt her even more than they have in the past.  She said that she hardly can stand it and that at one point you had mentioned getting an MRI.  Is this something that we should go ahead and go through with or would you like her to come in and see you again first? Please advise.

## 2014-11-19 NOTE — Telephone Encounter (Signed)
Blood pressure was 145/88 today but she said she hadn't taken her meds today.

## 2014-11-20 NOTE — Telephone Encounter (Signed)
Not too bad considering she was off meds. Okay to place referral.

## 2014-11-20 NOTE — Telephone Encounter (Signed)
Referral placed for neurology

## 2014-11-24 ENCOUNTER — Other Ambulatory Visit: Payer: Self-pay | Admitting: Family Medicine

## 2014-11-25 ENCOUNTER — Other Ambulatory Visit: Payer: Self-pay | Admitting: *Deleted

## 2014-11-25 ENCOUNTER — Telehealth: Payer: Self-pay | Admitting: *Deleted

## 2014-11-25 MED ORDER — OXYCODONE-ACETAMINOPHEN 10-325 MG PO TABS: 1.0000 | ORAL_TABLET | Freq: Three times a day (TID) | ORAL | Status: DC | PRN

## 2014-11-25 NOTE — Telephone Encounter (Signed)
How much insulin is she on now?  We need to get her sugars under 130.

## 2014-11-25 NOTE — Telephone Encounter (Signed)
Pt wants to stay on the 75mg  tid lyrica for now.  She said she's having terrible night sweats since she started it. I told her I was unsure if that med could be the culprit but that she could certainly run that by the neurologist when she goes.  She also stated that a couple times her glucose has been down to the 160s but a couple times it was over the 200s.

## 2014-11-25 NOTE — Telephone Encounter (Signed)
OK to refill the percocet. We can also increae the lyrica to 100mg  TID.    Also see how her sugar is doing. What numbers is she getting at home.

## 2014-11-25 NOTE — Telephone Encounter (Signed)
Pt called today stating that the hydrocodone isn't helping her at all.  She was in tears.  She has a neurology appt but they couldn't get her in until next month. She said that the percocets you gave her last time helped more and she'd be willing to bring [place] her remaining rx of the hydrocodone if you wanted her to.  Please advise.

## 2014-11-26 NOTE — Telephone Encounter (Signed)
Pt has still been doing 52 units.  I advised her again of previous instructions to increase to 55 units tonight, then go up 1 unit ever other day until fasting sugars are under 130.  Pt voiced understanding.

## 2014-12-01 ENCOUNTER — Other Ambulatory Visit: Payer: Self-pay | Admitting: Family Medicine

## 2014-12-01 NOTE — Telephone Encounter (Signed)
Spoke w/Libby and gave Verbal for refill.Audelia Hives [place]

## 2014-12-02 ENCOUNTER — Other Ambulatory Visit: Payer: Self-pay | Admitting: Family Medicine

## 2014-12-02 ENCOUNTER — Telehealth: Payer: Self-pay

## 2014-12-02 MED ORDER — INSULIN GLARGINE 100 UNIT/ML SOLOSTAR PEN: 60.0000 [IU] | PEN_INJECTOR | Freq: Every day | SUBCUTANEOUS | Status: DC

## 2014-12-02 NOTE — Telephone Encounter (Signed)
Pt's daughter's phone she gave me another # to call.Erin [name]

## 2014-12-02 NOTE — Telephone Encounter (Signed)
A PA is required for Toujeo. Is there something she can be switched to? She has been out for 2 days. Please advise.

## 2014-12-02 NOTE — Telephone Encounter (Signed)
Patient reports that Lantus is covered on her formulary. We'll go ahead and send a prescription.

## 2014-12-03 ENCOUNTER — Other Ambulatory Visit: Payer: Self-pay | Admitting: *Deleted

## 2014-12-05 ENCOUNTER — Other Ambulatory Visit: Payer: Self-pay

## 2014-12-05 MED ORDER — ALPRAZOLAM 0.5 MG PO TABS: 0.5000 mg | ORAL_TABLET | Freq: Two times a day (BID) | ORAL | Status: DC | PRN

## 2014-12-13 ENCOUNTER — Other Ambulatory Visit: Payer: Self-pay | Admitting: Family Medicine

## 2014-12-15 ENCOUNTER — Telehealth: Payer: Self-pay | Admitting: Family Medicine

## 2014-12-15 NOTE — Telephone Encounter (Signed)
Patient called clinic to see if she could get her pain medication refilled. Advised her the last Rx was written on 11/25/14 and we would not be able to fill it again until 12/25/14. Patient states the pharmacy told her she would be able to get a refill tomorrow, I advised that was incorrect and it would be next week. Verbalized understanding, no further questions.

## 2014-12-24 ENCOUNTER — Telehealth: Payer: Self-pay | Admitting: *Deleted

## 2014-12-24 MED ORDER — OXYCODONE-ACETAMINOPHEN 10-325 MG PO TABS: 1.0000 | ORAL_TABLET | Freq: Three times a day (TID) | ORAL | Status: DC | PRN

## 2014-12-24 NOTE — Telephone Encounter (Signed)
Pt called and stated that she went to the pharmacy to p/u her lyrica and was unable to get it she was told that it would need a PA. I informed the pt that once we received the form we would fill out the information and send this back to her insurance company to resubmit to the pharmacy. She was told that this process may take some time to complete. Pt also asked for refill of pain med. Refill will be placed up front for p/u.Erin [name] [place]

## 2014-12-25 ENCOUNTER — Telehealth: Payer: Self-pay | Admitting: Family Medicine

## 2014-12-25 ENCOUNTER — Other Ambulatory Visit: Payer: Self-pay | Admitting: *Deleted

## 2014-12-25 MED ORDER — INSULIN DETEMIR 100 UNIT/ML FLEXPEN: 60.0000 [IU] | Freq: Every day | SUBCUTANEOUS | Status: DC

## 2014-12-25 NOTE — Telephone Encounter (Signed)
Patient came into clinic stating her Rx for Lantus requires a PA and she is going to run out before that will get approved. Pt given a sample in office.

## 2014-12-25 NOTE — Telephone Encounter (Signed)
Received fax for prior authorization on Lyrica sent through cover my meds waiting on auth. - CF

## 2014-12-29 NOTE — Telephone Encounter (Signed)
Resubmitted Prior Authorization to [facility] waiting on authorization. - CF

## 2014-12-29 NOTE — Telephone Encounter (Signed)
Received fax from [place] has been approved from 12/29/2014 - 12/28/2017. Authorization # [id number] - CF

## 2014-12-29 NOTE — Telephone Encounter (Signed)
Received fax from [place] stating medication was denied due to not trying gabepentin. Patient did try gabepentin in 2015 I will resubmit pa. - CF

## 2014-12-31 ENCOUNTER — Encounter: Payer: Self-pay | Admitting: Family Medicine

## 2014-12-31 ENCOUNTER — Ambulatory Visit (INDEPENDENT_AMBULATORY_CARE_PROVIDER_SITE_OTHER): Payer: No Typology Code available for payment source | Admitting: Family Medicine

## 2014-12-31 VITALS — BP 134/86 | HR 86 | Ht 66.0 in | Wt 192.0 lb

## 2014-12-31 DIAGNOSIS — R059 Cough, unspecified: Secondary | ICD-10-CM

## 2014-12-31 DIAGNOSIS — E118 Type 2 diabetes mellitus with unspecified complications: Secondary | ICD-10-CM | POA: Diagnosis not present

## 2014-12-31 DIAGNOSIS — R05 Cough: Secondary | ICD-10-CM | POA: Diagnosis not present

## 2014-12-31 DIAGNOSIS — E1165 Type 2 diabetes mellitus with hyperglycemia: Secondary | ICD-10-CM

## 2014-12-31 DIAGNOSIS — E1142 Type 2 diabetes mellitus with diabetic polyneuropathy: Secondary | ICD-10-CM | POA: Diagnosis not present

## 2014-12-31 DIAGNOSIS — I10 Essential (primary) hypertension: Secondary | ICD-10-CM | POA: Diagnosis not present

## 2014-12-31 DIAGNOSIS — IMO0002 Reserved for concepts with insufficient information to code with codable children: Secondary | ICD-10-CM

## 2014-12-31 LAB — POCT GLYCOSYLATED HEMOGLOBIN (HGB A1C): Hemoglobin A1C: 10

## 2014-12-31 MED ORDER — "NEEDLE (DISP) 25G X 5/8"" MISC": Status: DC

## 2014-12-31 MED ORDER — INSULIN SYRINGES (DISPOSABLE) U-100 1 ML MISC: Status: DC

## 2014-12-31 NOTE — Progress Notes (Signed)
   Subjective:    Patient ID: Erin [name], female    DOB: 1956-04-19, 59 y.o.   MRN: [id number]  HPI DM- Diabetes - no hypoglycemic events. No wounds or sores that are not healing well. No increased thirst or urination. Checking glucose at home. Taking medications as prescribed without any side effects.  Her 14 day average is 261 and the 30 day is 245.  She has cut back on her food intake.   Hypertension- Pt denies chest pain, SOB, dizziness, or heart palpitations.  Taking meds as directed w/o problems.  Denies medication side effects.    Peripheral neuropahty  - they are now covering her Lyrica. Had nerve conduction study done at the Neurology.   Cough x 2 week.  WEight.  No nasal congestion or ST.  + post nasal drip.  No ear pain.  + chills. No fever.   Review of Systems     Objective:   Physical Exam  Constitutional: She is oriented to person, place, and time. She appears well-developed and well-nourished.  HENT:  Head: Normocephalic and atraumatic.  Right Ear: External ear normal.  Left Ear: External ear normal.  Nose: Nose normal.  Mouth/Throat: Oropharynx is clear and moist.  TMs and canals are clear.   Eyes: Conjunctivae and EOM are normal. Pupils are equal, round, and reactive to light.  Neck: Neck supple. No thyromegaly present.  Cardiovascular: Normal rate, regular rhythm and normal heart sounds.   Pulmonary/Chest: Effort normal and breath sounds normal. She has no wheezes.  Lymphadenopathy:    She has no cervical adenopathy.  Neurological: She is alert and oriented to person, place, and time.  Skin: Skin is warm and dry.  Psychiatric: She has a normal mood and affect.          Assessment & Plan:  DM- Uncontrolled, though improving. Lantus increase to 80 units. F/u in 3 months.  Will refer to ophtho.  Foot exam performed today. Stressed the importance of using her medication regularly and calling us if there are any issues or problems. I did look to see if we  have any samples today and we did not. Given a prescription for insulin syringes and needles. Encouraged her to go to her local pharmacy and have them show her how to draw the medication from the bile said that she can start administering the insulin. If still not clear whether not she is on Lantus or Levemir. With the phone notes says that the Lantus was approved but we have Levemir on her medication list. I encouraged her to call us later today when she gets home and let us know which medication she is actually using. Either way, increased 80 units.  HTN - well controlled. F/U in 3 months.   Peripheral neuropathy - had EMG study performed yesterday. She follows up next month with neurology to review the results. Currently she is on Lyrica which we were able to get approved through her insurance. And she is also on chronic pain medication at this time. Once again stressed the importance of getting her A1c under control to help reduce her risk of neuropathy.  Cough-most likely secondary to postnasal drip. Recommend nasal saline rinse, chemotherapy to fire at night and possibly the addition of an histamine if she would like to try that.

## 2015-01-01 LAB — BASIC METABOLIC PANEL WITH GFR
BUN: 20 mg/dL (ref 6–23)
CHLORIDE: 102 meq/L (ref 96–112)
CO2: 26 mEq/L (ref 19–32)
CREATININE: 0.87 mg/dL (ref 0.50–1.10)
Calcium: 9.8 mg/dL (ref 8.4–10.5)
GFR, EST AFRICAN AMERICAN: 84 mL/min
GFR, Est Non African American: 73 mL/min
Glucose, Bld: 280 mg/dL — ABNORMAL HIGH (ref 70–99)
POTASSIUM: 4.7 meq/L (ref 3.5–5.3)
Sodium: 137 mEq/L (ref 135–145)

## 2015-01-01 LAB — LIPID PANEL
Cholesterol: 163 mg/dL (ref 0–200)
HDL: 34 mg/dL — AB (ref >=46)
LDL Cholesterol: 94 mg/dL (ref 0–99)
TRIGLYCERIDES: 177 mg/dL — AB (ref <150)
Total CHOL/HDL Ratio: 4.8 Ratio
VLDL: 35 mg/dL (ref 0–40)

## 2015-01-07 ENCOUNTER — Telehealth: Payer: Self-pay | Admitting: *Deleted

## 2015-01-07 MED ORDER — DOXYCYCLINE HYCLATE 100 MG PO TABS: 100.0000 mg | ORAL_TABLET | Freq: Two times a day (BID) | ORAL | Status: DC

## 2015-01-07 NOTE — Telephone Encounter (Signed)
Pt notified of rx. 

## 2015-01-07 NOTE — Telephone Encounter (Signed)
Pt left vm stating that her cough has gotten worse & she wants to know if you'd send her something in.

## 2015-01-07 NOTE — Telephone Encounter (Signed)
Ok, rx sent. If not better in one week then come back in.

## 2015-01-08 ENCOUNTER — Other Ambulatory Visit: Payer: Self-pay | Admitting: Family Medicine

## 2015-01-15 ENCOUNTER — Telehealth: Payer: Self-pay | Admitting: *Deleted

## 2015-01-15 NOTE — Telephone Encounter (Signed)
Pt left vm asking for a refill on her oxycodone.  Last fill was 5-18 #60 1 tab q8h prn.  Is this ok to fill? Please advise.

## 2015-01-15 NOTE — Telephone Encounter (Signed)
No, not due until the 17th of month.  I can refer her to pain management if she would like.

## 2015-01-16 NOTE — Telephone Encounter (Signed)
Patient advised.

## 2015-01-20 ENCOUNTER — Telehealth: Payer: Self-pay | Admitting: *Deleted

## 2015-01-20 MED ORDER — PREGABALIN 100 MG PO CAPS: 100.0000 mg | ORAL_CAPSULE | Freq: Three times a day (TID) | ORAL | Status: DC

## 2015-01-20 NOTE — Telephone Encounter (Signed)
Pt left vm stating that her legs are hurting her really bad & wants to know if she can increase her Lyrica.  If so, to what dose? Please advise.

## 2015-01-20 NOTE — Telephone Encounter (Signed)
OK, I increased her lyrica to 100mg  TID. New rx sent.

## 2015-01-21 ENCOUNTER — Other Ambulatory Visit: Payer: Self-pay | Admitting: *Deleted

## 2015-01-21 NOTE — Telephone Encounter (Signed)
LMOM notifying pt of new rx & new dosing instructions.

## 2015-01-23 ENCOUNTER — Other Ambulatory Visit: Payer: Self-pay | Admitting: *Deleted

## 2015-01-23 MED ORDER — OXYCODONE-ACETAMINOPHEN 10-325 MG PO TABS: 1.0000 | ORAL_TABLET | Freq: Three times a day (TID) | ORAL | Status: DC | PRN

## 2015-02-06 ENCOUNTER — Other Ambulatory Visit: Payer: Self-pay | Admitting: Family Medicine

## 2015-02-06 NOTE — Telephone Encounter (Signed)
Pt informed that we need to see her for an office visit for future refills on xanax.

## 2015-02-16 ENCOUNTER — Other Ambulatory Visit: Payer: Self-pay | Admitting: Family Medicine

## 2015-02-17 ENCOUNTER — Telehealth: Payer: Self-pay

## 2015-02-17 DIAGNOSIS — E1142 Type 2 diabetes mellitus with diabetic polyneuropathy: Secondary | ICD-10-CM

## 2015-02-17 NOTE — Telephone Encounter (Signed)
Erin [name] called and left a message asking for Oxycodone early. She stated she was really hurting. Last fill was on 01/23/2015. Please advise.

## 2015-02-18 NOTE — Telephone Encounter (Signed)
I'm going to refer her to pain management and we will refill her medications at her due date for her medicines.

## 2015-02-19 NOTE — Telephone Encounter (Signed)
Left detailed message.   

## 2015-02-20 ENCOUNTER — Other Ambulatory Visit: Payer: Self-pay | Admitting: *Deleted

## 2015-02-20 MED ORDER — OXYCODONE-ACETAMINOPHEN 10-325 MG PO TABS: 1.0000 | ORAL_TABLET | Freq: Three times a day (TID) | ORAL | Status: DC | PRN

## 2015-03-10 ENCOUNTER — Other Ambulatory Visit: Payer: Self-pay | Admitting: Family Medicine

## 2015-03-16 ENCOUNTER — Other Ambulatory Visit: Payer: Self-pay | Admitting: Physician Assistant

## 2015-03-16 ENCOUNTER — Telehealth: Payer: Self-pay | Admitting: *Deleted

## 2015-03-16 NOTE — Telephone Encounter (Signed)
Pt called today stating that she has lost her ins and is out of insulin for right now.  She will be getting another type but right now she's without everything.  The levemir is way too expensive so she wants to know if she could go back on the toujeo or whatever is the cheapest.  Please advise.

## 2015-03-16 NOTE — Telephone Encounter (Signed)
We can give her some samples of Toujeo

## 2015-03-16 NOTE — Telephone Encounter (Signed)
Pt will pick up samples tomorrow

## 2015-04-02 ENCOUNTER — Ambulatory Visit (INDEPENDENT_AMBULATORY_CARE_PROVIDER_SITE_OTHER): Payer: Self-pay | Admitting: Family Medicine

## 2015-04-02 ENCOUNTER — Encounter: Payer: Self-pay | Admitting: Family Medicine

## 2015-04-02 VITALS — BP 121/83 | HR 86 | Ht 66.0 in | Wt 192.0 lb

## 2015-04-02 DIAGNOSIS — E1165 Type 2 diabetes mellitus with hyperglycemia: Secondary | ICD-10-CM

## 2015-04-02 DIAGNOSIS — E118 Type 2 diabetes mellitus with unspecified complications: Secondary | ICD-10-CM

## 2015-04-02 DIAGNOSIS — Z23 Encounter for immunization: Secondary | ICD-10-CM

## 2015-04-02 DIAGNOSIS — IMO0002 Reserved for concepts with insufficient information to code with codable children: Secondary | ICD-10-CM

## 2015-04-02 LAB — POCT GLYCOSYLATED HEMOGLOBIN (HGB A1C): HEMOGLOBIN A1C: 9.7

## 2015-04-02 MED ORDER — OXYCODONE-ACETAMINOPHEN 10-325 MG PO TABS: 1.0000 | ORAL_TABLET | Freq: Three times a day (TID) | ORAL | Status: DC | PRN

## 2015-04-02 NOTE — Progress Notes (Signed)
   Subjective:    Patient ID: Erin [name], female    DOB: 13-Sep-1955, 59 y.o.   MRN: [id number]  HPI Diabetes - no hypoglycemic events. No wounds or sores that are not healing well. No increased thirst or urination. Checking glucose at home. Taking medications as prescribed without any side effects.  Neuropathy - she ran out of insurance. She won't be able to fill her lyrica bc she is out of insurance.  Says is rally helped. She was able to reduce her pain medications.    Hypertension- Pt denies chest pain, SOB, dizziness, or heart palpitations.  Taking meds as directed w/o problems.  Denies medication side effects.     Review of Systems     Objective:   Physical Exam  Constitutional: She is oriented to person, place, and time. She appears well-developed and well-nourished.  HENT:  Head: Normocephalic and atraumatic.  Cardiovascular: Normal rate, regular rhythm and normal heart sounds.   Pulmonary/Chest: Effort normal and breath sounds normal.  Neurological: She is alert and oriented to person, place, and time.  Skin: Skin is warm and dry.  Psychiatric: She has a normal mood and affect. Her behavior is normal.          Assessment & Plan:  Diabetes - uncontrolled. Hemoglobin A1c 9.7 today that is improved from previous. She said she is really trying hard. She's not been able to use her insulin consistently because she hasn't had insurance. We did give her some samples of Toujeo today which hopefully will get her through until she gets her insurance. Just have her use 60 units each was her previous dose especially since she doesn't have any test strips right now.  Neuropathy - the Lyrica seems to be working really well unfortunately she hasn't been able to afford at the last couple of weeks because that she doesn't have insurance. We do not have any samples to provide says she will just have to go without. I did go ahead and refill her oxycodone today to tide her over.    hypertension-well-controlled. Continue current regimen. She is still taking her amlodipine and lisinopril.

## 2015-04-14 ENCOUNTER — Other Ambulatory Visit: Payer: Self-pay | Admitting: Family Medicine

## 2015-04-17 ENCOUNTER — Other Ambulatory Visit: Payer: Self-pay | Admitting: Family Medicine

## 2015-04-29 ENCOUNTER — Telehealth: Payer: Self-pay

## 2015-04-29 NOTE — Telephone Encounter (Signed)
Denied.

## 2015-04-29 NOTE — Telephone Encounter (Signed)
Erin [name] left a message and wants a refill on Oxycodone. Please advise.

## 2015-04-30 NOTE — Telephone Encounter (Signed)
Patient advised.

## 2015-05-13 ENCOUNTER — Telehealth: Payer: Self-pay

## 2015-05-13 ENCOUNTER — Ambulatory Visit: Payer: Self-pay | Admitting: Family Medicine

## 2015-05-13 ENCOUNTER — Other Ambulatory Visit: Payer: Self-pay | Admitting: Family Medicine

## 2015-05-13 NOTE — Telephone Encounter (Signed)
Patient would like [place] to ok an early refill for her xanax. Please advise.

## 2015-05-14 LAB — HM DIABETES EYE EXAM

## 2015-05-14 NOTE — Telephone Encounter (Signed)
No early refill. I can refer her to psych or therapy is she would like if she is really struggling emotionally with some things.

## 2015-05-14 NOTE — Telephone Encounter (Signed)
Left message advising of recommendations.  

## 2015-05-24 ENCOUNTER — Other Ambulatory Visit: Payer: Self-pay | Admitting: Family Medicine

## 2015-05-25 ENCOUNTER — Encounter: Payer: Self-pay | Admitting: Family Medicine

## 2015-05-25 ENCOUNTER — Ambulatory Visit: Payer: Self-pay | Admitting: Family Medicine

## 2015-05-26 ENCOUNTER — Encounter: Payer: Self-pay | Admitting: Family Medicine

## 2015-05-26 ENCOUNTER — Ambulatory Visit (INDEPENDENT_AMBULATORY_CARE_PROVIDER_SITE_OTHER): Payer: Self-pay | Admitting: Family Medicine

## 2015-05-26 VITALS — BP 115/79 | HR 72 | Wt 193.0 lb

## 2015-05-26 DIAGNOSIS — Z794 Long term (current) use of insulin: Secondary | ICD-10-CM

## 2015-05-26 DIAGNOSIS — E118 Type 2 diabetes mellitus with unspecified complications: Secondary | ICD-10-CM

## 2015-05-26 DIAGNOSIS — E1165 Type 2 diabetes mellitus with hyperglycemia: Secondary | ICD-10-CM

## 2015-05-26 DIAGNOSIS — IMO0002 Reserved for concepts with insufficient information to code with codable children: Secondary | ICD-10-CM

## 2015-05-26 DIAGNOSIS — E1142 Type 2 diabetes mellitus with diabetic polyneuropathy: Secondary | ICD-10-CM

## 2015-05-26 DIAGNOSIS — F1120 Opioid dependence, uncomplicated: Secondary | ICD-10-CM

## 2015-05-26 MED ORDER — GABAPENTIN 100 MG PO CAPS: ORAL_CAPSULE | ORAL | Status: DC

## 2015-05-26 NOTE — Progress Notes (Signed)
   Subjective:    Patient ID: Erin [name], female    DOB: 11/25/1955, 59 y.o.   MRN: [id number]  HPI Diabetes - no hypoglycemic events. No wounds or sores that are not healing well. No increased thirst or urination. Checking glucose at home. Taking medications as prescribed without any side effects. Says couldn't  She has a history of diabetic peripheral neuropathy as well as foot pain. She has been buying hydrocodone off the street.  We had referrred her to pain management back in July but she wants to get off of narcotics.  Last time took something was yesterday.    Diabetic peripheral neuropathy-she says she can no longer afford her Lyrica and wants and if there something that might be similar that she could take. We had previously put her on gabapentin in the past which I think was helpful just didn't work as well as Lyrica but certainly we can switch her back to the gabapentin.  Review of Systems     Objective:   Physical Exam  Constitutional: She is oriented to person, place, and time. She appears well-developed and well-nourished.  HENT:  Head: Normocephalic and atraumatic.  Cardiovascular: Normal rate, regular rhythm and normal heart sounds.   Pulmonary/Chest: Effort normal and breath sounds normal.  Neurological: She is alert and oriented to person, place, and time.  Skin: Skin is warm and dry.  Psychiatric: She has a normal mood and affect. Her behavior is normal.          Assessment & Plan:  DM-uncontrolled. She was a new for repeat A1c today. given sample of Lantus today and free voucher card for toujeo.  This should hopefully help get her through for at least the next month. We discussed the importance of eating is consistent with medications as possible we discussed multiple options to help her try to afford her medications including applying to the drug companies assistance programs and also looking for a medication assistance program in her county, [place].   Narcotic pain medication addiction. Gave her the phone numbers for the addiction programs at [facility] in [facility] and at [employer] in [place]. Encouraged her to call one of them to get set up into a program to help with her current narcotic addiction. I still encouraged her to consider seeing pain management to discuss alternative options for pain control besides narcotics as they can offer a lot of things besides narcotic pain medications.  Peripheral neuropathy-see discussion above for pain control. We will discontinue the Lyrica and put her on gabapentin. New prescription and sent to the pharmacy and encouraged her to actually check on cash Price is at local pharmacies to see who may offer the better cost savings.

## 2015-05-26 NOTE — Patient Instructions (Signed)
Lantus, Toujeo, and Levemir are all long acting insulins.

## 2015-06-09 ENCOUNTER — Telehealth: Payer: Self-pay

## 2015-06-09 NOTE — Telephone Encounter (Signed)
Patient advised of recommendations.  

## 2015-06-09 NOTE — Telephone Encounter (Signed)
Recommend B6 30mg  daily Continue to increase Toujeo by 1 unit every 2 days until sugars under 150.  Really watch diet.  Stay away juices, etc

## 2015-06-09 NOTE — Telephone Encounter (Signed)
Patient states she is taking 80 units of Toujeo once daily and glipizide. Her fasting blood sugar is still around 200. She also would like recommendations for leg cramp. Such as a supplement.

## 2015-06-15 ENCOUNTER — Other Ambulatory Visit: Payer: Self-pay | Admitting: Family Medicine

## 2015-06-17 ENCOUNTER — Encounter: Payer: Self-pay | Admitting: Osteopathic Medicine

## 2015-06-17 ENCOUNTER — Ambulatory Visit (INDEPENDENT_AMBULATORY_CARE_PROVIDER_SITE_OTHER): Payer: Self-pay | Admitting: Osteopathic Medicine

## 2015-06-17 VITALS — BP 129/75 | HR 81 | Temp 98.5°F | Ht 66.0 in | Wt 196.0 lb

## 2015-06-17 DIAGNOSIS — IMO0002 Reserved for concepts with insufficient information to code with codable children: Secondary | ICD-10-CM | POA: Insufficient documentation

## 2015-06-17 DIAGNOSIS — E118 Type 2 diabetes mellitus with unspecified complications: Secondary | ICD-10-CM

## 2015-06-17 DIAGNOSIS — Z794 Long term (current) use of insulin: Secondary | ICD-10-CM

## 2015-06-17 DIAGNOSIS — N3001 Acute cystitis with hematuria: Secondary | ICD-10-CM

## 2015-06-17 DIAGNOSIS — E1165 Type 2 diabetes mellitus with hyperglycemia: Secondary | ICD-10-CM

## 2015-06-17 DIAGNOSIS — R300 Dysuria: Secondary | ICD-10-CM

## 2015-06-17 LAB — POCT URINALYSIS DIPSTICK
Bilirubin, UA: NEGATIVE
Glucose, UA: 500
Ketones, UA: NEGATIVE
Nitrite, UA: NEGATIVE
Protein, UA: 100
Spec Grav, UA: 1.02
Urobilinogen, UA: 0.2
pH, UA: 5.5

## 2015-06-17 MED ORDER — SULFAMETHOXAZOLE-TRIMETHOPRIM 800-160 MG PO TABS: 1.0000 | ORAL_TABLET | Freq: Two times a day (BID) | ORAL | Status: DC

## 2015-06-17 NOTE — Progress Notes (Signed)
Chief Complaint: Possible UTI  History of Present Illness: Erin [name] is a 59 y.o. female who presents to [facility]  today with concerns for acute urinary tract infection  Onset: 2 weeks  Quality: Burning/Urgency Exacerbating factors:   Frequency: yes  Hematuria: no  Odor: yes  Fever/chills: yes  Incontinence: no  Flank Pain: no Previous UTI: last one was <10 years ago Recurrent UTI (3 times/more annually): no Abx in past 3 months: yes - getting tooth pulled, don't remember name, tooth pulled maybe 2 months ago Note: (+)DM2, last A1C 9.7 2 mos ago, sugars running 200-300 in the morning, taking Toujeo 80 Units at night.   Complicated (any of the following): ?Diabetes ?Pregnancy ?Symptoms for seven or more days before seeking care ?Hospital acquired infection ?Renal failure ?Urinary tract obstruction ?Presence of an indwelling urethral catheter, stent, nephrostomy tube or urinary diversion ?Functional or anatomic abnormality of the urinary tract ?Renal transplantation ?Immunosuppression   Past medical, social and family history reviewed: Past Medical History  Diagnosis Date  . Kidney stones   . Diabetes mellitus   . Hypertension   . Obesity   . Anxiety   . Fatty liver    Past Surgical History  Procedure Laterality Date  . Abdominal hysterectomy      complete with BSO for dysplasia  . [place]otri[place]  08-2008    [facility]   Social History  Substance Use Topics  . Smoking status: Former Smoker    Quit date: 08/09/2003  . Smokeless tobacco: Not on file  . Alcohol Use: No   The patient has a family history of  Current Outpatient Prescriptions  Medication Sig Dispense Refill  . ALPRAZolam (XANAX) 0.5 MG tablet TAKE ONE TABLET BY MOUTH TWICE DAILY AS NEEDED FOR ANXIETY 60 tablet 0  . AMBULATORY NON FORMULARY MEDICATION Medication Name: Freestyle Insulix Test Strips TID testing Diagnosis Code:250.0 100 each 1  . AMBULATORY NON  FORMULARY MEDICATION Medication Name: Relion meter and test strips. 100 strip 3  . AMBULATORY NON FORMULARY MEDICATION Glucometer device, lancets and test strips.   Dx: 250.02 1 Device 0  . AMBULATORY NON FORMULARY MEDICATION Accucheck Aviva Plus meter and test strips Please check glucose 3 times daily. Dx: E11.8 100 each 2  . amLODipine (NORVASC) 5 MG tablet Take 1 tablet (5 mg total) by mouth daily. 30 tablet 0  . aspirin 81 MG tablet Take 81 mg by mouth daily.      Marland Kitchen atenolol (TENORMIN) 50 MG tablet TAKE ONE TABLET BY MOUTH ONCE DAILY 90 tablet 0  . b complex vitamins tablet Take 1 tablet by mouth daily.    Marland Kitchen gabapentin (NEURONTIN) 100 MG capsule Take 1 at bedtime for 3 days, then increase to BID x 3 days and then increase to TID. 90 capsule 3  . glipiZIDE (GLUCOTROL) 10 MG tablet TAKE ONE TABLET BY MOUTH TWICE DAILY BEFORE MEAL(S) (NEED  FOLLOW  UP  APPOINTMENT  FOR  FUTURE  REFILLS) 60 tablet 0  . Insulin Pen Needle (EXEL PEN NEEDLES 29GX1/2") 29G X 12MM MISC Use daily as directed. 100 each 1  . Insulin Syringes, Disposable, U-100 1 ML MISC Use daily as directed 100 each 11  . lisinopril (PRINIVIL,ZESTRIL) 40 MG tablet Take 1 tablet (40 mg total) by mouth daily. 30 tablet 0  . NEEDLE, DISP, 25 G (BD DISP NEEDLES) 25G X 5/8" MISC Use daily as directed 100 each 11  . oxyCODONE-acetaminophen (PERCOCET) 10-325 MG per tablet  Take 1 tablet by mouth every 8 (eight) hours as needed for pain. OK to fill on 02/22/15 60 tablet 0  . simvastatin (ZOCOR) 40 MG tablet TAKE ONE TABLET BY MOUTH AT BEDTIME 90 tablet 0  . TOUJEO SOLOSTAR 300 UNIT/ML SOPN INJECT 30 UNITS SUBCUTANEOUSLY DAILY INCREASE BY TWO UNITS EVERY 5 DAYS UNTIL FASTING GLUCOSE IS 120. MAX DAILY DOSE 100 UNITS 4.5 mL PRN   No current facility-administered medications for this visit.   Allergies  Allergen Reactions  . Amitriptyline Other (See Comments)    Vision change.   Wilder Glade [Dapagliflozin]     Increased kidney function  .  Hydrochlorothiazide     REACTION: cause,weird feelings  . Liraglutide     REACTION: N/V  . Metformin     REACTION: swelling  . Prednisone     Very high sugars 600 and above.   . Tramadol Nausea And Vomiting     Review of Systems: CONSTITUTIONAL: Positive chills fever/chills CARDIAC: No chest pain/pressure/palpitations, no orthopnea RESPIRATORY: No cough/shortness of breath/wheeze GASTROINTESTINAL: No nausea/vomiting/abdominal pain/blood in stool/diarrhea/constipation MUSCULOSKELETAL: No myalgia/arthralgia/back pain GENITOURINARY: Negative incontinence, Negative abnormal genital bleeding/discharge. (+)Dysuria/UTI symptoms as per HPI   Exam:  Filed Vitals:   06/17/15 1511  Height: 5\' 6"  (1.676 m)  Weight: 196 lb (88.905 kg)   Constitutional: VSS, see above. General Appearance: alert, well-developed, well-nourished, NAD Respiratory: Normal respiratory effort. Breath sounds normal, no wheeze/rhonchi/rales Cardiovascular: S1/S2 normal, no murmur/rub/gallop auscultated. RRR Gastrointestinal: Nontender, no masses. No hepatomegaly, no splenomegaly. No hernia appreciated. Rectal exam deferred.  Musculoskeletal: Gait normal. No clubbing/cyanosis of digits. Lloyd sign Negative bilateral   ASSESSMENT/PLAN:  Acute cystitis with hematuria - Plan: sulfamethoxazole-trimethoprim (BACTRIM DS) 800-160 MG tablet x 7 days since diabetic and protracted symptoms, declines UCx due to cost but if not improving wil need to insist she gets this lab done  Burning with urination - Plan: POCT Urinalysis Dipstick, CANCELED: Urine Culture, CANCELED: Urinalysis, microscopic only  Uncontrolled type 2 diabetes mellitus with complication, with long-term current use of insulin (HCC) - Toujeo samples given, increase to 85 Units/day, reviewed instructions for titrating up on this insulin, which patient has not been doing, titrate to FBG 120s don't exceed 100 Units w/o discussion w/ dr Madilyn Fireman

## 2015-06-28 ENCOUNTER — Other Ambulatory Visit: Payer: Self-pay | Admitting: Family Medicine

## 2015-06-29 ENCOUNTER — Other Ambulatory Visit: Payer: Self-pay | Admitting: Family Medicine

## 2015-06-29 ENCOUNTER — Telehealth: Payer: Self-pay

## 2015-06-29 ENCOUNTER — Encounter: Payer: Self-pay | Admitting: Family Medicine

## 2015-06-29 ENCOUNTER — Ambulatory Visit (INDEPENDENT_AMBULATORY_CARE_PROVIDER_SITE_OTHER): Payer: Self-pay | Admitting: Family Medicine

## 2015-06-29 VITALS — BP 123/81 | HR 78 | Temp 98.6°F | Resp 18 | Wt 190.4 lb

## 2015-06-29 DIAGNOSIS — E1142 Type 2 diabetes mellitus with diabetic polyneuropathy: Secondary | ICD-10-CM

## 2015-06-29 DIAGNOSIS — E1165 Type 2 diabetes mellitus with hyperglycemia: Secondary | ICD-10-CM

## 2015-06-29 DIAGNOSIS — I10 Essential (primary) hypertension: Secondary | ICD-10-CM

## 2015-06-29 DIAGNOSIS — E118 Type 2 diabetes mellitus with unspecified complications: Secondary | ICD-10-CM

## 2015-06-29 LAB — POCT GLYCOSYLATED HEMOGLOBIN (HGB A1C): HEMOGLOBIN A1C: 9.4

## 2015-06-29 MED ORDER — GLIPIZIDE 10 MG PO TABS: ORAL_TABLET | ORAL | Status: DC

## 2015-06-29 MED ORDER — GABAPENTIN 100 MG PO CAPS: 120.0000 mg | ORAL_CAPSULE | Freq: Four times a day (QID) | ORAL | Status: DC

## 2015-06-29 MED ORDER — NORTRIPTYLINE HCL 25 MG PO CAPS: 25.0000 mg | ORAL_CAPSULE | Freq: Every day | ORAL | Status: DC

## 2015-06-29 MED ORDER — ALPRAZOLAM 0.5 MG PO TABS: 0.5000 mg | ORAL_TABLET | Freq: Two times a day (BID) | ORAL | Status: DC | PRN

## 2015-06-29 MED ORDER — ROSIGLITAZONE MALEATE 2 MG PO TABS: 2.0000 mg | ORAL_TABLET | Freq: Every day | ORAL | Status: DC

## 2015-06-29 NOTE — Telephone Encounter (Signed)
Patient states DM medication will cost 90 dollars a month. She cannot afford this medication.

## 2015-06-29 NOTE — Progress Notes (Signed)
   Subjective:    Patient ID: Erin [name], female    DOB: April 01, 1956, 59 y.o.   MRN: [id number]  HPI   Diabetes - no hypoglycemic events. No wounds or sores that are not healing well. No increased thirst or urination. Checking glucose at home. Taking medications as prescribed without any side effects. Now taking 80 units daily of the Toujeo. She's tolerating that well but says her blood sugars are still running in the 200 range. No increased thirst or urination.  She still has a lot of pain in her feet. She says she feels like her bones are brittle and like her going to rake when she walks on them and wants and if there's anything else that we can try. She does feel like the gabapentin has been helpful to reduce the pinprick sensations. She's currently taking 100 mg 3 times a day.  Hypertension- Pt denies chest pain, SOB, dizziness, or heart palpitations.  Taking meds as directed w/o problems.  Denies medication side effects.      Review of Systems     Objective:   Physical Exam  Constitutional: She is oriented to person, place, and time. She appears well-developed and well-nourished.  HENT:  Head: Normocephalic and atraumatic.  Cardiovascular: Normal rate, regular rhythm and normal heart sounds.   Pulmonary/Chest: Effort normal and breath sounds normal.  Neurological: She is alert and oriented to person, place, and time.  Skin: Skin is warm and dry.  Psychiatric: She has a normal mood and affect. Her behavior is normal.          Assessment & Plan:  DM- A1C down to 9.4. Will add avandia.  Should be generic and hopefully will be reasonable cost wise. Will add to her glipizide. Monitor for any side effects. She tolerates that well then we may be able to adjust her dose. She started at 80 units of her Toujeo. I will see her back in 6 weeks. If she's not able to get the Avandia or if not tolerating it well then we'll increase her Toujeo.  Peripheral neuropathy - will increase  gabapentin to 4 times a day and will try the addition of nortriptyline. She had tried amitriptyline in the past and had some blurry vision. So we will try this and see how she does. Follow-up in 6 weeks.  HTN- well controlled. Continue current regimen.   I did ask her if she was still using and abusing narcotics. She said she has stopped them completely. She said she did go through some mild withdrawal symptoms with shakes and chills but otherwise is doing well and has not taken them in the last month.

## 2015-06-29 NOTE — Patient Instructions (Signed)
B6 is really goo for leg cramps.

## 2015-06-30 NOTE — Telephone Encounter (Signed)
Patient advised.

## 2015-06-30 NOTE — Telephone Encounter (Signed)
We will have to push up the insulin for now. Instead of 80 units once a day I would like her to do 50 units twice a day. Please remove the medication from th med list.

## 2015-07-14 ENCOUNTER — Telehealth: Payer: Self-pay

## 2015-07-14 MED ORDER — CAPSAICIN 0.075 % EX CREA: 1.0000 "application " | TOPICAL_CREAM | Freq: Two times a day (BID) | CUTANEOUS | Status: DC

## 2015-07-14 NOTE — Telephone Encounter (Signed)
Ok will send over Rx for capsacian cream to apply to her feet BID.

## 2015-07-14 NOTE — Telephone Encounter (Signed)
Erin [name] called and reports she cannot take the nortriptyline. It causes headaches and stomach pains. She states her feet still hurt and keep her up at night. She would like a different pain medication.

## 2015-07-14 NOTE — Telephone Encounter (Signed)
Left message advising of recommendations.  

## 2015-07-29 ENCOUNTER — Other Ambulatory Visit: Payer: Self-pay | Admitting: Family Medicine

## 2015-08-04 ENCOUNTER — Telehealth: Payer: Self-pay

## 2015-08-04 NOTE — Telephone Encounter (Signed)
Patient was denied assistance for her lantus.  Advised patient she could get another sample from the office.  She is also going to go the the website and get the copay card and see if that will help.  She also has problems paying for lyrica.  Advised patient to go online and apply for patient assistance.

## 2015-08-11 ENCOUNTER — Encounter: Payer: Self-pay | Admitting: Family Medicine

## 2015-08-11 ENCOUNTER — Ambulatory Visit (INDEPENDENT_AMBULATORY_CARE_PROVIDER_SITE_OTHER): Payer: Self-pay | Admitting: Family Medicine

## 2015-08-11 VITALS — BP 130/84 | HR 78 | Wt 190.0 lb

## 2015-08-11 DIAGNOSIS — E1142 Type 2 diabetes mellitus with diabetic polyneuropathy: Secondary | ICD-10-CM

## 2015-08-11 DIAGNOSIS — E118 Type 2 diabetes mellitus with unspecified complications: Secondary | ICD-10-CM

## 2015-08-11 DIAGNOSIS — G4700 Insomnia, unspecified: Secondary | ICD-10-CM

## 2015-08-11 DIAGNOSIS — B351 Tinea unguium: Secondary | ICD-10-CM

## 2015-08-11 DIAGNOSIS — F411 Generalized anxiety disorder: Secondary | ICD-10-CM

## 2015-08-11 DIAGNOSIS — E1165 Type 2 diabetes mellitus with hyperglycemia: Secondary | ICD-10-CM

## 2015-08-11 MED ORDER — ALPRAZOLAM 0.5 MG PO TABS: 0.5000 mg | ORAL_TABLET | Freq: Two times a day (BID) | ORAL | Status: DC | PRN

## 2015-08-11 MED ORDER — GABAPENTIN 300 MG PO CAPS: 300.0000 mg | ORAL_CAPSULE | Freq: Two times a day (BID) | ORAL | Status: DC

## 2015-08-11 MED ORDER — TRAZODONE HCL 50 MG PO TABS: 25.0000 mg | ORAL_TABLET | Freq: Every evening | ORAL | Status: DC | PRN

## 2015-08-11 MED ORDER — INSULIN GLARGINE 300 UNIT/ML ~~LOC~~ SOPN: 50.0000 [IU] | PEN_INJECTOR | Freq: Two times a day (BID) | SUBCUTANEOUS | Status: DC

## 2015-08-11 NOTE — Progress Notes (Signed)
   Subjective:    Patient ID: Erin [name], female    DOB: May 01, 1956, 60 y.o.   MRN: [id number]  HPI Diabetes - no hypoglycemic events. No wounds or sores that are not healing well. No increased thirst or urination. Checking glucose at home. Taking medications as prescribed without any side effects. We added avandia at the last OV. It was too expensive to buy.   Lab Results  Component Value Date   HGBA1C 9.4 06/29/2015   Using an antifungal cream on toenails.  Says she would like me to look at them today.     Not sleeping well the last few months. She does take Xanax at bedtime to help relax her mind and her anxiety as well as the gabapentin. She says that will help her fall asleep and then she typically wakes up after about 3 hours and then cannot get back to sleep. This is been going on for several months. She denies any loud noises. She denies any nocturia that's waking her up. She tries to go to bed and wake up at the same time.  Peripheral neuropathy-this is affecting her ability to work. She says the pain is so great. She is thinking about applying for disability.   Review of Systems     Objective:   Physical Exam  Constitutional: She is oriented to person, place, and time. She appears well-developed and well-nourished.  HENT:  Head: Normocephalic and atraumatic.  Cardiovascular: Normal rate, regular rhythm and normal heart sounds.   Pulmonary/Chest: Effort normal and breath sounds normal.  Neurological: She is alert and oriented to person, place, and time.  Skin: Skin is warm and dry.  Psychiatric: She has a normal mood and affect. Her behavior is normal.          Assessment & Plan:  Diabetes-last 11 A1c was uncontrolled. I'm seeing her for an interim visit just so that we can make sure that her sugars are coming down before the next hemoglobin A1c. Follow-up in 6 weeks. She is now up to 100 units total daily on the Toujeo but did run out so I gave her some samples of  Tresiba.  Peripheral neuropathy-we'll increase gabapentin to a total of 600 mg daily which is up from previous dose of 400 mg. We can see how this is working last seen her back in 6 weeks.  Insomnia-discussed a trial of trazodone. She avoids caffeine and has a set bedtime. Discussed pros and cons of the medication. Monitor for any excess sedation in the mornings. Start with half a tab and can slowly increase to 1 tablet or up to 2 if needed. Will need to be very careful mixing this with the gabapentin and the alprazolam.I want to stay way from habit-forming medications at all possible.  Generalized anxiety disorder-did refill her alprazolam but they did it so that she can fill it later this week. For a few days early for her prescription.  Onychomycosis-she does look like she probably has some fungus on both great toenails. It also looks like she may actually lose the toe nails. She can certainly continue the topical antifungal that she is using it she would like.

## 2015-08-13 ENCOUNTER — Telehealth: Payer: Self-pay

## 2015-08-13 NOTE — Telephone Encounter (Signed)
Erin [name] wants her xanax refilled early. Is this early refill appropriate. We will need to call the pharmacy with answer.

## 2015-08-13 NOTE — Telephone Encounter (Signed)
Left message advising of recommendations.  

## 2015-08-13 NOTE — Telephone Encounter (Signed)
No, can refill tomorrow.

## 2015-08-24 ENCOUNTER — Other Ambulatory Visit: Payer: Self-pay | Admitting: Family Medicine

## 2015-09-07 ENCOUNTER — Other Ambulatory Visit: Payer: Self-pay | Admitting: Family Medicine

## 2015-09-09 ENCOUNTER — Other Ambulatory Visit: Payer: Self-pay | Admitting: Family Medicine

## 2015-09-13 ENCOUNTER — Other Ambulatory Visit: Payer: Self-pay | Admitting: Family Medicine

## 2015-09-22 ENCOUNTER — Ambulatory Visit (INDEPENDENT_AMBULATORY_CARE_PROVIDER_SITE_OTHER): Payer: Self-pay | Admitting: Family Medicine

## 2015-09-22 ENCOUNTER — Encounter: Payer: Self-pay | Admitting: Family Medicine

## 2015-09-22 VITALS — BP 126/81 | HR 76 | Ht 66.0 in | Wt 191.9 lb

## 2015-09-22 DIAGNOSIS — E118 Type 2 diabetes mellitus with unspecified complications: Secondary | ICD-10-CM

## 2015-09-22 DIAGNOSIS — R0989 Other specified symptoms and signs involving the circulatory and respiratory systems: Secondary | ICD-10-CM

## 2015-09-22 DIAGNOSIS — E1142 Type 2 diabetes mellitus with diabetic polyneuropathy: Secondary | ICD-10-CM

## 2015-09-22 DIAGNOSIS — E1165 Type 2 diabetes mellitus with hyperglycemia: Secondary | ICD-10-CM

## 2015-09-22 LAB — POCT GLYCOSYLATED HEMOGLOBIN (HGB A1C): HEMOGLOBIN A1C: 10.1

## 2015-09-22 MED ORDER — PREGABALIN 50 MG PO CAPS: 50.0000 mg | ORAL_CAPSULE | Freq: Three times a day (TID) | ORAL | Status: DC

## 2015-09-22 NOTE — Progress Notes (Signed)
   Subjective:    Patient ID: Erin [name], female    DOB: 07-28-1956, 60 y.o.   MRN: [id number]  HPI Diabetes - no hypoglycemic events. No wounds or sores that are not healing well. No increased thirst or urination. Checking glucose at home. Taking medications as prescribed without any side effects.  pt reports that her BS are running in the 200's in the morning.   Neuropahty - she would also like a Rx for Lyrica she said that she has a coupon for this  Has had a week of cough, no sinus congestion .+ chillls no fever.  Feels like SOB at time. No wheezing.  Cough is productive.    Review of Systems     Objective:   Physical Exam  Constitutional: She is oriented to person, place, and time. She appears well-developed and well-nourished.  HENT:  Head: Normocephalic and atraumatic.  Right Ear: External ear normal.  Left Ear: External ear normal.  Nose: Nose normal.  Mouth/Throat: Oropharynx is clear and moist.  TMs and canals are clear.   Eyes: Conjunctivae and EOM are normal. Pupils are equal, round, and reactive to light.  Neck: Neck supple. No thyromegaly present.  Cardiovascular: Normal rate, regular rhythm and normal heart sounds.   Pulmonary/Chest: Effort normal and breath sounds normal. She has no wheezes.  Lymphadenopathy:    She has no cervical adenopathy.  Neurological: She is alert and oriented to person, place, and time.  Skin: Skin is warm and dry.  Psychiatric: She has a normal mood and affect.       Assessment & Plan:  DM- Increase Toujeo by 10 units a day in the evening. After one week if sugar still greater than 150 then increase by 10 units in the AM.  F/U in 3 months. Reminded her about eye exam.   Diabetic neuropathy - will restart Lyrica.  She found a coupon online.  I do think this really helped her sxs.    Chest congestion - likely viral.  Also consider GERD vs post nasal drip. If not better in one week, then consdier CXR as well.

## 2015-09-22 NOTE — Patient Instructions (Signed)
Increase evening dose on your toujeo to 60 units, Keep the morning 50 units for one week.  Then increase the AM dose to 60 units, so you are on 60 twice a day.

## 2015-09-28 ENCOUNTER — Telehealth: Payer: Self-pay

## 2015-09-28 NOTE — Telephone Encounter (Signed)
Erin [name] states she has had diarrhea off and on for a couple of weeks. She states it is uncontrollable. Denies fever, chills or sweats. No abdominal pain. Please advise.

## 2015-09-28 NOTE — Telephone Encounter (Signed)
We can order a test for stool cultures and C. Difficile. Ok to place order. It may need to be stamped a self-pay. Also it may also be the fact that her sugars have been out of control. His make sure staying well-hydrated.  She can try Imodium in the interim.

## 2015-09-28 NOTE — Telephone Encounter (Signed)
Left message for patient to call back  

## 2015-10-09 ENCOUNTER — Other Ambulatory Visit: Payer: Self-pay | Admitting: Family Medicine

## 2015-10-12 ENCOUNTER — Other Ambulatory Visit: Payer: Self-pay | Admitting: Family Medicine

## 2015-10-13 ENCOUNTER — Other Ambulatory Visit: Payer: Self-pay | Admitting: Family Medicine

## 2015-10-15 ENCOUNTER — Ambulatory Visit: Payer: Self-pay | Admitting: Family Medicine

## 2015-11-02 ENCOUNTER — Telehealth: Payer: Self-pay

## 2015-11-02 NOTE — Telephone Encounter (Signed)
Ok to switch.  Can dose at 50 units BID Ok to increase by 2 units eat day until AM sugar under 125.

## 2015-11-02 NOTE — Telephone Encounter (Signed)
Erin [name] cannot afford the Toujeo. She needs to be switched to something cheaper. We don't have any samples. Please advise.

## 2015-11-02 NOTE — Telephone Encounter (Signed)
Novolin N is the [employer] brand. It is a 10 ml vial and it cost $24.88. See note below.

## 2015-11-03 ENCOUNTER — Other Ambulatory Visit: Payer: Self-pay | Admitting: Family Medicine

## 2015-11-03 MED ORDER — "NEEDLE (DISP) 30G X 1/2"" MISC": Status: AC

## 2015-11-03 MED ORDER — SYRINGE (DISPOSABLE) 1 ML MISC: Status: AC

## 2015-11-03 MED ORDER — INSULIN NPH (HUMAN) (ISOPHANE) 100 UNIT/ML ~~LOC~~ SUSP: 50.0000 [IU] | Freq: Two times a day (BID) | SUBCUTANEOUS | Status: DC

## 2015-11-03 NOTE — Telephone Encounter (Signed)
Sent medication to pharmacy. Patient is aware.

## 2015-11-05 ENCOUNTER — Telehealth: Payer: Self-pay | Admitting: Family Medicine

## 2015-11-05 NOTE — Telephone Encounter (Signed)
It was gabapentin. She actually should be able to call her pharmacy is the original perception that was written in January had refills on it. She can take that instead of the Lyrica.

## 2015-11-05 NOTE — Telephone Encounter (Signed)
Pt called office to get Lyrica samples, we do not have any in stock. Pt asked to see if PCP can write an Rx on "what she was on before" because it's "cheaper." Will route.

## 2015-11-06 NOTE — Telephone Encounter (Signed)
Left information on Pt's VM. 

## 2015-11-13 ENCOUNTER — Other Ambulatory Visit: Payer: Self-pay | Admitting: Family Medicine

## 2015-11-23 ENCOUNTER — Ambulatory Visit: Payer: Self-pay | Admitting: Physician Assistant

## 2015-11-24 ENCOUNTER — Ambulatory Visit (INDEPENDENT_AMBULATORY_CARE_PROVIDER_SITE_OTHER): Payer: Self-pay | Admitting: Physician Assistant

## 2015-11-24 ENCOUNTER — Encounter: Payer: Self-pay | Admitting: Physician Assistant

## 2015-11-24 VITALS — BP 112/69 | HR 90 | Temp 98.0°F | Ht 66.0 in | Wt 188.0 lb

## 2015-11-24 DIAGNOSIS — R531 Weakness: Secondary | ICD-10-CM

## 2015-11-24 DIAGNOSIS — R319 Hematuria, unspecified: Secondary | ICD-10-CM

## 2015-11-24 DIAGNOSIS — R197 Diarrhea, unspecified: Secondary | ICD-10-CM

## 2015-11-24 DIAGNOSIS — R300 Dysuria: Secondary | ICD-10-CM

## 2015-11-24 DIAGNOSIS — R159 Full incontinence of feces: Secondary | ICD-10-CM

## 2015-11-24 DIAGNOSIS — N390 Urinary tract infection, site not specified: Secondary | ICD-10-CM

## 2015-11-24 LAB — POCT URINALYSIS DIPSTICK
BILIRUBIN UA: NEGATIVE
GLUCOSE UA: NEGATIVE
KETONES UA: NEGATIVE
LEUKOCYTES UA: NEGATIVE
Nitrite, UA: POSITIVE
Protein, UA: 100
Spec Grav, UA: >=1.03
Urobilinogen, UA: 0.2
pH, UA: 5

## 2015-11-24 MED ORDER — METRONIDAZOLE 500 MG PO TABS: 500.0000 mg | ORAL_TABLET | Freq: Three times a day (TID) | ORAL | Status: DC

## 2015-11-24 MED ORDER — CIPROFLOXACIN HCL 500 MG PO TABS: 500.0000 mg | ORAL_TABLET | Freq: Two times a day (BID) | ORAL | Status: DC

## 2015-11-24 MED ORDER — OMEPRAZOLE 40 MG PO CPDR: 40.0000 mg | DELAYED_RELEASE_CAPSULE | Freq: Every day | ORAL | Status: DC

## 2015-11-24 NOTE — Patient Instructions (Signed)
Stop NSAIDs.  Start omeprazole.  Start abx for c.diff and UTI.

## 2015-11-24 NOTE — Addendum Note (Signed)
Addended byDonella Stade on: 11/24/2015 11:02 AM   Modules accepted: Level of Service

## 2015-11-24 NOTE — Progress Notes (Addendum)
   Subjective:    Patient ID: Erin [name], female    DOB: 10/10/1955, 60 y.o.   MRN: [id number]  HPI Patient states she has been having uncontrollable diarrhea off and on for the last two months since starting Gabapentin. She switched to Lyrica but states the diarrhea did not resolve. lyrica was too expensive so she has restarted gabapentin. Frequency of diarrhea has increased over the last two days. She states she is having diarrhea every 2-3 minutes now.  Stools are yellow/green and watery with a foul odor. No blood in stool. She has been taking Immodium with no relief. States she is having left lower back pain but denies abdominal pain. Patient denies fever and vomiting. Reports chills. She reports decreased appetite and states she feels weak. No ill contacts. No recent travel. No recent antibiotic use. She does have diverticulosis on colonoscopy exam. She has ongoing fecal incontience regularly just not this bad.    Review of Systems  Constitutional: Positive for chills, appetite change (Decreased) and fatigue. Negative for fever.  Genitourinary: Positive for dysuria, urgency and frequency.  Neurological: Positive for weakness.       Objective:   Physical Exam  Constitutional: She is oriented to person, place, and time. She appears well-developed.  Appears weak.  HENT:  Head: Normocephalic and atraumatic.  Cardiovascular: Normal rate, regular rhythm and normal heart sounds.   Pulmonary/Chest: Effort normal and breath sounds normal.  Negative for CVA tenderness  Abdominal: Soft. Bowel sounds are normal. She exhibits no distension. There is tenderness. There is no rebound and no guarding.  Moderate tenderness over LUQ. No guarding or rebound.   Musculoskeletal:       Arms: Neurological: She is alert and oriented to person, place, and time.  Psychiatric: She has a normal mood and affect. Her behavior is normal.          Assessment & Plan:  1. Diarrhea- Will order CBC, stool  culture, and C. Diff. Start emperic Metronidazole pending C. Diff results. Unclear etiology. I do not think medication reaction. She has been on gabapentin before. cipro and metro will also treat for diverticulitis. Increase fiber in diet. Call if worsening or not improving.   2. Generalized Weakness- vitals stable with no signs of dehydration. Will order CMP due to excessive diarrhea over the last two days.   3. UTI- Patient reports urinary symptoms and with diarrhea infection risk increases. .. Results for orders placed or performed in visit on 11/24/15  POCT urinalysis dipstick  Result Value Ref Range   Color, UA yellow    Clarity, UA cloudy    Glucose, UA neg    Bilirubin, UA neg    Ketones, UA neg    Spec Grav, UA >=1.030    Blood, UA trace-lysed    pH, UA 5.0    Protein, UA 100    Urobilinogen, UA 0.2    Nitrite, UA pos    Leukocytes, UA Negative Negative    UA +nitrates and trace blood. Negative leukocytes. Urine sent for culture. Will treat with Cipro.  4. LUQ tenderness- Patient reports recent antiinflammatory use with LUQ pain. Suspect gastric ulcer could be forming. Encouraged patient to stop all antiinflammatories. Will start Omeprazole 40mg  daily. Follow up if symptoms worsening. Take omeprazole for next 8 weeks then can stop.

## 2015-11-25 ENCOUNTER — Ambulatory Visit: Payer: Self-pay | Admitting: Physician Assistant

## 2015-11-27 LAB — URINE CULTURE: Colony Count: >=100000

## 2015-12-04 ENCOUNTER — Ambulatory Visit: Payer: Self-pay | Admitting: Family Medicine

## 2015-12-11 ENCOUNTER — Other Ambulatory Visit: Payer: Self-pay | Admitting: Family Medicine

## 2015-12-14 ENCOUNTER — Other Ambulatory Visit: Payer: Self-pay | Admitting: Family Medicine

## 2015-12-21 ENCOUNTER — Encounter: Payer: Self-pay | Admitting: Family Medicine

## 2015-12-21 ENCOUNTER — Ambulatory Visit (INDEPENDENT_AMBULATORY_CARE_PROVIDER_SITE_OTHER): Payer: Self-pay | Admitting: Family Medicine

## 2015-12-21 VITALS — BP 125/73 | HR 80 | Wt 194.0 lb

## 2015-12-21 DIAGNOSIS — Z794 Long term (current) use of insulin: Secondary | ICD-10-CM

## 2015-12-21 DIAGNOSIS — E1165 Type 2 diabetes mellitus with hyperglycemia: Secondary | ICD-10-CM

## 2015-12-21 DIAGNOSIS — IMO0002 Reserved for concepts with insufficient information to code with codable children: Secondary | ICD-10-CM

## 2015-12-21 DIAGNOSIS — E785 Hyperlipidemia, unspecified: Secondary | ICD-10-CM

## 2015-12-21 DIAGNOSIS — E1142 Type 2 diabetes mellitus with diabetic polyneuropathy: Secondary | ICD-10-CM

## 2015-12-21 DIAGNOSIS — I10 Essential (primary) hypertension: Secondary | ICD-10-CM

## 2015-12-21 DIAGNOSIS — E118 Type 2 diabetes mellitus with unspecified complications: Secondary | ICD-10-CM

## 2015-12-21 LAB — POCT GLYCOSYLATED HEMOGLOBIN (HGB A1C): HEMOGLOBIN A1C: 10

## 2015-12-21 NOTE — Patient Instructions (Signed)
Increase the NPH insulin to 60 units twice a day. If your blood sugars are coming under 130 after 2 weeks and please give me a call so we can make a change over the phone. Increase gabapentin to 3 times a day. I'll send over new prescription sent to Dr. run out short period

## 2015-12-21 NOTE — Progress Notes (Signed)
Subjective:    Patient ID: Erin [name], female    DOB: 06/30/1956, 60 y.o.   MRN: [id number]  HPI Diabetes - no hypoglycemic events. In fact her lowest blood sugar was 160. We recently switched to NPH and she is taking 50 units twice a day. No wounds or sores that are not healing well. No increased thirst or urination. Checking glucose at home. Taking medications as prescribed without any side effects. She has lost 3 lbs.    Hypertension- Pt denies chest pain, SOB, dizziness, or heart palpitations.  Taking meds as directed w/o problems.  Denies medication side effects.    Diabetic peripheral neuropathy-currently on gabapentin 300 mg twice a day. She feels like her pain has really increased over the last year. In fact today she rates her pain pain a 10 out of 10. She says it makes it really difficult for her to get motivated and do her usual daily routine.  She says she cannot afford her mammogram.   Review of Systems  BP 125/73 mmHg  Pulse 80  Wt 194 lb (87.998 kg)  SpO2 96%    Allergies  Allergen Reactions  . Amitriptyline Other (See Comments)    Vision change.   Wilder Glade [Dapagliflozin]     Increased kidney function  . Hydrochlorothiazide     REACTION: cause,weird feelings  . Liraglutide     REACTION: N/V  . Metformin     REACTION: swelling  . Nortriptyline Other (See Comments)    Headache  . Prednisone     Very high sugars 600 and above.   . Tramadol Nausea And Vomiting    Past Medical History  Diagnosis Date  . Kidney stones   . Diabetes mellitus   . Hypertension   . Obesity   . Anxiety   . Fatty liver     Past Surgical History  Procedure Laterality Date  . Abdominal hysterectomy      complete with BSO for dysplasia  . [place]ri[place]  08[place]2008    [facility]    Social History   Social History  . Marital Status: Married    Spouse Name: N/A  . Number of Children: N/A  . Years of Education: N/A   Occupational History  . Not on file.   Social  History Main Topics  . Smoking status: Former Smoker    Quit date: 08/09/2003  . Smokeless tobacco: Not on file  . Alcohol Use: No  . Drug Use: No  . 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   Other Topics Concern  . Not on file   Social History Narrative    Family History  Problem Relation Age of Onset  . Cancer Mother     breast and kidney  . Kidney disease Son   . Cancer Other     pancreatic cancer    Outpatient Encounter Prescriptions as of 12/21/2015  Medication Sig  . ALPRAZolam (XANAX) 0.5 MG tablet Take 1 tablet (0.5 mg total) by mouth 2 (two) times daily as needed for anxiety.  . AMBULATORY NON FORMULARY MEDICATION Accucheck Aviva Plus meter and test strips Please check glucose 3 times daily. Dx: E11.8  . amLODipine (NORVASC) 5 MG tablet Take 1 tablet (5 mg total) by mouth daily. KEEP UPCOMING APPOINTMENT  . aspirin 81 MG tablet Take 81 mg by mouth daily.    Marland Kitchen  atenolol (TENORMIN) 50 MG tablet TAKE ONE TABLET BY MOUTH ONCE DAILY  . gabapentin (NEURONTIN) 300 MG capsule Take 1 capsule (300 mg total) by mouth 2 (two) times daily.  Marland Kitchen glipiZIDE (GLUCOTROL) 10 MG tablet TAKE ONE TABLET BY MOUTH TWICE DAILY BEFORE MEAL(S)  . insulin NPH Human (NOVOLIN N) 100 UNIT/ML injection Inject 0.5 mLs (50 Units total) into the skin 2 (two) times daily before a meal.  . lisinopril (PRINIVIL,ZESTRIL) 40 MG tablet TAKE ONE TABLET BY MOUTH ONCE DAILY  . NEEDLE, DISP, 30 G (BD DISP NEEDLES) 30G X 1/2" MISC Inject into skin twice daily before meals.  Marland Kitchen omeprazole (PRILOSEC) 40 MG capsule Take 1 capsule (40 mg total) by mouth daily.  . simvastatin (ZOCOR) 40 MG tablet TAKE ONE TABLET BY MOUTH AT BEDTIME NEEDS  LABWORK  BEFORE  REFILLS  . Syringe, Disposable, 1 ML MISC Inject into skin twice daily before meals.  . [DISCONTINUED] AMBULATORY NON  FORMULARY MEDICATION Glucometer device, lancets and test strips.   Dx: 250.02  . [DISCONTINUED] Insulin Pen Needle (EXEL PEN NEEDLES 29GX1/2") 29G X 12MM MISC Use daily as directed.  . [DISCONTINUED] Insulin Syringes, Disposable, U-100 1 ML MISC Use daily as directed  . [DISCONTINUED] metroNIDAZOLE (FLAGYL) 500 MG tablet Take 1 tablet (500 mg total) by mouth 3 (three) times daily. For 10 days.  . [DISCONTINUED] NEEDLE, DISP, 25 G (BD DISP NEEDLES) 25G X 5/8" MISC Use daily as directed  . [DISCONTINUED] ciprofloxacin (CIPRO) 500 MG tablet Take 1 tablet (500 mg total) by mouth 2 (two) times daily. For 10 days.   No facility-administered encounter medications on file as of 12/21/2015.          Objective:   Physical Exam  Constitutional: She is oriented to person, place, and time. She appears well-developed and well-nourished.  HENT:  Head: Normocephalic and atraumatic.  Cardiovascular: Normal rate, regular rhythm and normal heart sounds.   Pulmonary/Chest: Effort normal and breath sounds normal.  Neurological: She is alert and oriented to person, place, and time.  Skin: Skin is warm and dry.  Psychiatric: She has a normal mood and affect. Her behavior is normal.        Assessment & Plan:  DM-  Uncontrolled.  A1C is 10.0.  She is on an ACE inhibitor and statin. We discussed getting back on track in setting some goals. Will increase her NPH to 60 units twice a day. We did have to switch insulins recently because of cost. Did encourage her to call me after 2 weeks if she is not starting to see any blood sugars under 130. Lab Results  Component Value Date   HGBA1C 10.1 09/22/2015   HTN -Well controlled. Continue current regimen. Follow up in 3 months   Diabetic peripheral neuropathy-continue with gabapentin. In fact because of her increasing pain will increase her gabapentin to 3 times a day. I also thought about adding amitriptyline but she is actually already tried this in the past  and it caused some visual changes. Congratulated her on weight loss. I will look to see if whether any other things that we can try to help with her pain besides narcotics. I've gone down that path with her previously and she was abusing them.  Mammogram screening-pronounced application to see if she might qualify for free mammogram through the scholarship program at [facility].

## 2015-12-22 ENCOUNTER — Telehealth: Payer: Self-pay | Admitting: Family Medicine

## 2015-12-22 MED ORDER — NORTRIPTYLINE HCL 10 MG PO CAPS: 10.0000 mg | ORAL_CAPSULE | Freq: Every day | ORAL | Status: DC

## 2015-12-22 MED ORDER — CAPSAICIN 0.075 % EX CREA: 1.0000 "application " | TOPICAL_CREAM | Freq: Three times a day (TID) | CUTANEOUS | Status: DC

## 2015-12-22 NOTE — Telephone Encounter (Signed)
Call patient: I would really like for her to retry nortriptyline. We have tried at one point in time there some really good study showing that in combination with the gabapentin and can really improved pain control for neuropathy. She can start with 1 tablet bedtime and after 5 days increase to twice a day. And then we can even go up on the medication if she feels like it's helping. I'm also, send of her prescription for Facing cream. This can be applied to the feet. It does have a burning sensation when it's first applied that this is normal as well.

## 2015-12-23 NOTE — Telephone Encounter (Signed)
Pt informed and is ok with starting medication.Erin [name] [place]

## 2015-12-24 ENCOUNTER — Other Ambulatory Visit: Payer: Self-pay | Admitting: Family Medicine

## 2015-12-25 ENCOUNTER — Other Ambulatory Visit: Payer: Self-pay | Admitting: Family Medicine

## 2015-12-29 ENCOUNTER — Other Ambulatory Visit: Payer: Self-pay | Admitting: Family Medicine

## 2015-12-29 DIAGNOSIS — Z1231 Encounter for screening mammogram for malignant neoplasm of breast: Secondary | ICD-10-CM

## 2015-12-30 ENCOUNTER — Ambulatory Visit
Admission: RE | Admit: 2015-12-30 | Discharge: 2015-12-30 | Disposition: A | Discharge status: Home / Self Care | Payer: No Typology Code available for payment source | Source: Ambulatory Visit | Attending: Family Medicine | Admitting: Family Medicine

## 2015-12-30 DIAGNOSIS — Z1231 Encounter for screening mammogram for malignant neoplasm of breast: Secondary | ICD-10-CM

## 2016-01-11 ENCOUNTER — Other Ambulatory Visit: Payer: Self-pay | Admitting: Physician Assistant

## 2016-01-13 ENCOUNTER — Ambulatory Visit: Payer: No Typology Code available for payment source | Admitting: Physician Assistant

## 2016-01-21 ENCOUNTER — Ambulatory Visit (INDEPENDENT_AMBULATORY_CARE_PROVIDER_SITE_OTHER): Payer: No Typology Code available for payment source | Admitting: Osteopathic Medicine

## 2016-01-21 DIAGNOSIS — Z5329 Procedure and treatment not carried out because of patient's decision for other reasons: Secondary | ICD-10-CM

## 2016-01-21 NOTE — Progress Notes (Signed)
NO SHOW

## 2016-01-28 ENCOUNTER — Other Ambulatory Visit: Payer: Self-pay | Admitting: Family Medicine

## 2016-02-03 ENCOUNTER — Other Ambulatory Visit: Payer: Self-pay | Admitting: Family Medicine

## 2016-02-12 ENCOUNTER — Encounter: Payer: Self-pay | Admitting: Family Medicine

## 2016-02-12 ENCOUNTER — Ambulatory Visit (INDEPENDENT_AMBULATORY_CARE_PROVIDER_SITE_OTHER): Payer: Self-pay | Admitting: Family Medicine

## 2016-02-12 VITALS — BP 119/72 | HR 86 | Wt 194.0 lb

## 2016-02-12 DIAGNOSIS — M76891 Other specified enthesopathies of right lower limb, excluding foot: Secondary | ICD-10-CM

## 2016-02-12 DIAGNOSIS — R8290 Unspecified abnormal findings in urine: Secondary | ICD-10-CM

## 2016-02-12 DIAGNOSIS — A09 Infectious gastroenteritis and colitis, unspecified: Secondary | ICD-10-CM

## 2016-02-12 DIAGNOSIS — N760 Acute vaginitis: Secondary | ICD-10-CM

## 2016-02-12 DIAGNOSIS — R197 Diarrhea, unspecified: Secondary | ICD-10-CM

## 2016-02-12 LAB — POCT URINALYSIS DIPSTICK
BILIRUBIN UA: NEGATIVE
KETONES UA: NEGATIVE
NITRITE UA: NEGATIVE
PH UA: 5.5
Protein, UA: 30
Spec Grav, UA: 1.015
Urobilinogen, UA: 0.2

## 2016-02-12 MED ORDER — FLUCONAZOLE 150 MG PO TABS: 150.0000 mg | ORAL_TABLET | Freq: Once | ORAL | Status: DC

## 2016-02-12 NOTE — Patient Instructions (Signed)
After collects stool samples then can start the diflucan.

## 2016-02-12 NOTE — Progress Notes (Signed)
Subjective:    CC: Vaginitis  HPI:  Vaginitis- She reports that she has had persistent symptoms since she was last here in May. She says she feels like she keeps a yeast infection. He said urinary frequency and vaginal irritation. He was treated with Cipro in April for her symptoms. She denies any dysuria or hematuria or frequency. She's not having any abnormal vaginal discharge but has noticed an odor.  She does complain of persistent diarrhea that's been going on since April. She says that sometimes she is incontinent with the diarrhea and it's watery at times. She denies any recent hospitalizations or visiting nursing homes etc. She said one night she actually noticed bright red blood after a bowel movement and went to the ED in [place]. They checked her out and said that everything was okay.   Right knee pain that radiates downward. She says it started about 2 weeks ago after she went up [place]. She said the state in a house with her a lot of stairs and she was walking around the edge of the liquid there was a lot of sand so it was uneven surface. Has been using Tylenol.  She has been using a cane to walk because it has been so painful.  Past medical history, Surgical history, Family history not pertinant except as noted below, Social history, Allergies, and medications have been entered into the medical record, reviewed, and corrections made.   Review of Systems: No fevers, chills, night sweats, weight loss, chest pain, or shortness of breath.   Objective:    General: Well Developed, well nourished, and in no acute distress.  Neuro: Alert and oriented x3, extra-ocular muscles intact, sensation grossly intact.  HEENT: Normocephalic, atraumatic  Skin: Warm and dry, no rashes. Cardiac: Regular rate and rhythm, no murmurs rubs or gallops, no lower extremity edema.  Respiratory: Clear to auscultation bilaterally. Not using accessory muscles, speaking in full sentences. Ext: Right knee with  normal range of motion. She actually has some swelling over the inner part of the knee. Nontender along the joint lines. No significant crepitus. She is tender over the past anserine bursa.   Impression and Recommendations:   Vaginitis-wet prep collected will call with results once available. We also did a urinalysis and we'll send for culture. Next  Diarrhea-evaluate with stool culture antibiotic for possible C. difficile. Also consider that she could have overgrowth of yeast in her bowel as well. Did encourage her to collect the stool samples first and then can start oral Diflucan. New prescription sent to the pharmacy.  Right knee, has anserine bursitis-recommend conservative treatment. Can use anti-inflammatory as long as no GI upset. Recommend icing and handout provided with additional stretches. If not improving over 3 weeks and please let us know.

## 2016-02-12 NOTE — Addendum Note (Signed)
Addended by: Teddy Spike on: 02/12/2016 03:44 PM   Modules accepted: Orders, SmartSet

## 2016-02-13 LAB — WET PREP, GENITAL
CLUE CELLS WET PREP: NONE SEEN
Trich, Wet Prep: NONE SEEN

## 2016-02-15 ENCOUNTER — Other Ambulatory Visit: Payer: Self-pay | Admitting: Physician Assistant

## 2016-02-15 LAB — URINE CULTURE: Colony Count: >=100000

## 2016-02-15 MED ORDER — AMOXICILLIN-POT CLAVULANATE 875-125 MG PO TABS: 1.0000 | ORAL_TABLET | Freq: Two times a day (BID) | ORAL | Status: DC

## 2016-02-15 NOTE — Addendum Note (Signed)
Addended by: Beatrice Lecher D on: 02/15/2016 08:15 AM   Modules accepted: Orders, SmartSet

## 2016-02-20 LAB — C. DIFFICILE GDH AND TOXIN A/B
C. DIFF TOXIN A/B: NOT DETECTED
C. difficile GDH: NOT DETECTED

## 2016-02-22 ENCOUNTER — Encounter: Payer: Self-pay | Admitting: Family Medicine

## 2016-02-22 ENCOUNTER — Ambulatory Visit (INDEPENDENT_AMBULATORY_CARE_PROVIDER_SITE_OTHER): Payer: Self-pay

## 2016-02-22 ENCOUNTER — Ambulatory Visit (INDEPENDENT_AMBULATORY_CARE_PROVIDER_SITE_OTHER): Payer: Self-pay | Admitting: Family Medicine

## 2016-02-22 VITALS — BP 126/86 | HR 92

## 2016-02-22 DIAGNOSIS — M25561 Pain in right knee: Secondary | ICD-10-CM | POA: Insufficient documentation

## 2016-02-22 MED ORDER — DICLOFENAC SODIUM 1 % TD GEL: 4.0000 g | Freq: Four times a day (QID) | TRANSDERMAL | Status: DC

## 2016-02-22 MED ORDER — HYDROCODONE-ACETAMINOPHEN 5-325 MG PO TABS: 1.0000 | ORAL_TABLET | Freq: Four times a day (QID) | ORAL | Status: DC | PRN

## 2016-02-22 NOTE — Progress Notes (Signed)
   Subjective:    I'm seeing this patient as a consultation for:  Dr Madilyn Fireman  CC: Right Knee Pain  HPI: Patient notes a several week history of right medial anterior knee pain. It worsened dramatically recently. She denies any injury. She denies any radiating pain weakness or numbness fevers or chills. She was seen by her PCP recently who diagnosed her with Pes anserine bursitis. She's tried ibuprofen which has not helped at all. The pain is so bothersome she is using a walker at home and is in clinic today with a wheelchair.  She has a pertinent medical history for poorly controlled diabetes.  Past medical history, Surgical history, Family history not pertinant except as noted below, Social history, Allergies, and medications have been entered into the medical record, reviewed, and no changes needed.   Review of Systems: No new headache, visual changes, nausea, vomiting, diarrhea, constipation, dizziness, abdominal pain, skin rash, fevers, chills, night sweats, weight loss, swollen lymph nodes, body aches, joint swelling, muscle aches, chest pain, shortness of breath, mood changes, visual or auditory hallucinations.   Objective:    Filed Vitals:   02/22/16 1443  BP: 126/86  Pulse: 92   General: Well Developed, well nourished, and in no acute distress.  Neuro/Psych: Alert and oriented x3, extra-ocular muscles intact, able to move all 4 extremities, sensation grossly intact. Skin: Warm and dry, no rashes noted.  Respiratory: Not using accessory muscles, speaking in full sentences, trachea midline.  Cardiovascular: Pulses palpable, no extremity edema. Abdomen: Does not appear distended. MSK: Right knee is largely normal-appearing with no erythema or effusion. Reduced range of motion due to pain 10 - 110. Tender to palpation at the anterior medial knee near the Pes anserine region. Significant guarding with ligament testing. Capillary refill and sensation are intact distally  X-ray  right knee shows mild DJD. No acute findings. Awaiting formal radiology review   No results found for this or any previous visit (from the past 24 hour(s)). No results found.  Impression and Recommendations:    Assessment and Plan: 60 y.o. female with Right knee pain, certainly due to Pes anserine bursitis.  Discussed options. I'm reluctant to use a steroid injection due to her poorly controlled diabetes. However she is in quite a great deal of pain. Plan for topical diclofenac gel, physical therapy, and limited Norco. Return in 2 weeks.   Discussed warning signs or symptoms. Please see discharge instructions. Patient expresses understanding.  CC: METHENEY,CATHERINE, MD

## 2016-02-22 NOTE — Patient Instructions (Signed)
Thank you for coming in today. Attend PT.  Use voltaren gel.  Use norco for severe pain sparingly.  Return in 2 weeks.   Pes Anserinus Syndrome With Rehab The pes anserine, also known as the goose's foot, is an area of the shinbone (tibia) near the knee joint where the tendons of three of the muscles of the thigh insert into the bone. These muscles are important for bending the knee and bringing the leg across the body. Just underneath the three tendons that attach at the pes anserinus exists a fluid filled sac (bursa) that is meant to reduce the friction between the tendons and the tibia. Pes anserinus syndrome is a condition that is characterized by inflammation of the bursa (bursitis) and/ or tendonitis (inflammation of the tendon) and may cause severe pain in the lower portion of the inner (medial) side of the knee. SYMPTOMS   Pain and inflammation over the lower portion of the medial side of the knee.  Pain that worsens as the duration of an activity increases.  Pain that worsens when bending the knee, especially against resistance.  A crackling sound (crepitation) when the tendon or bursa is moved or touched. CAUSES  Bursitis and tendonitis are usually characterized as overuse injuries. Common mechanisms of injury include:  Stress placed on the knee from a sudden increase in the intensity, frequency, or duration of training.  Direct trauma to the upper leg (less common). RISK INCREASES WITH:  Endurance sports (distance running or triathletes).  Making changes to or beginning a new training program.  Sports that place stress on the muscles that insert at the pes anserinus, such as those that require pivoting, cutting, or jumping.  Improper training.  Poor strength and flexibility  Failure to warm-up properly before activity.  Improper knee alignment ( knock knees).  Arthritis of the knee. PREVENTION  Warm up and stretch properly before activity.  Allow for adequate  recovery between workouts.  Maintain physical fitness:  Strength, flexibility, and endurance.  Cardiovascular fitness.  Learn and use training methods that will reduce the stress placed on the pes anserinus.  Arch supports (orthotics) may be helpful for those with flat feet. PROGNOSIS  If treated properly, then the symptoms of pes anserinus syndrome usually resolve within 6 weeks.  RELATED COMPLICATIONS   Persistent and potentially chronic pain if the condition is not treated properly.  Re-injury if activity is resumed before the injury is allowed to heal completely, or if one resumes improper training habits. TREATMENT Treatment initially involves the use of ice and medication to help reduce pain and inflammation. The use of strengthening and stretching exercises may help reduce pain with activity. These exercises may be performed at home or with a therapist. Individuals who have flat feet may find benefit in wearing arch supports in their shoes. Some individuals find that compression bandages or knee sleeves help reduce symptoms. Your caregiver may recommend a corticosteroid injection to help reduce inflammation. If symptoms persist, despite conservative treatment for greater than 6 months, then surgery may be recommended.  MEDICATION   If pain medication is necessary, then nonsteroidal anti-inflammatory medications, such as aspirin and ibuprofen, or other minor pain relievers, such as acetaminophen, are often recommended.  Do not take pain medication for 7 days before surgery.  Prescription pain relievers may be given if deemed necessary by your caregiver. Use only as directed and only as much as you need.  Corticosteroid injections may be given by your caregiver. These injections should be reserved  for the most serious cases, because they may only be given a certain number of times. SEEK MEDICAL CARE IF:  Treatment seems to offer no benefit, or the condition worsens.  Any  medications produce adverse side effects. EXERCISES  RANGE OF MOTION (ROM) AND STRETCHING EXERCISES - Pes Anserinus Syndrome These exercises may help you when beginning to rehabilitate your injury. Your symptoms may resolve with or without further involvement from your physician, physical therapist or athletic trainer. While completing these exercises, remember:   Restoring tissue flexibility helps normal motion to return to the joints. This allows healthier, less painful movement and activity.  An effective stretch should be held for at least 30 seconds.  A stretch should never be painful. You should only feel a gentle lengthening or release in the stretched tissue. STRETCH - Hamstrings, Supine  Lie on your back. Loop a belt or towel over the ball of your right / left foot.  Straighten your right / left knee and slowly pull on the belt to raise your leg. Do not allow the right / left knee to bend. Keep your opposite leg flat on the floor.  Raise the leg until you feel a gentle stretch behind your right / left knee or thigh. Hold this position for __________ seconds. Repeat __________ times. Complete this stretch __________ times per day.  STRETCH - Hamstrings, Doorway  Lie on your back with your right / left leg extended and resting on the wall and the opposite leg flat on the ground through the door. Initially, position your bottom farther away from the wall than the illustration shows.  Keep your right / left knee straight. If you feel a stretch behind your knee or thigh, hold this position for __________ seconds.  If you do not feel a stretch, scoot your bottom closer to the door, and hold __________ seconds. Repeat __________ times. Complete this stretch __________ times per day.  STRETCH - Hamstrings/Adductors, V-Sit  Sit on the floor with your legs extended in a large "V," keeping your knees straight.  With your head and chest upright, bend at your waist reaching for your right  foot to stretch your left adductors.  You should feel a stretch in your left inner thigh. Hold for __________ seconds.  Return to the upright position to relax your leg muscles.  Continuing to keep your chest upright, bend straight forward at your waist to stretch your hamstrings.  You should feel a stretch behind both of your thighs and/or knees. Hold for __________ seconds.  Return to the upright position to relax your leg muscles.  Repeat steps 2 through 4. Repeat __________ times. Complete this exercise __________ times per day.  STRETCH - Hamstrings, Standing  Stand or sit and extend your right / left leg, placing your foot on a chair or foot stool  Keeping a slight arch in your low back and your hips straight forward.  Lead with your chest and lean forward at the waist until you feel a gentle stretch in the back of your right / left knee or thigh. (When done correctly, this exercise requires leaning only a small distance.)  Hold this position for __________ seconds. Repeat __________ times. Complete this stretch __________ times per day. STRETCH - Adductors, Lunge  While standing, spread your legs  Lean away from your right / left leg by bending your opposite knee. You may rest your hands on your thigh for balance.  You should feel a stretch in your right / left  inner thigh. Hold for __________ seconds. Repeat __________ times. Complete this exercise __________ times per day.  STRETCH - Adductors, Standing  Place your right / left foot on a counter or stable table. Turn away from your leg so both hips line up with your right / left leg.  Keeping your hips facing forward, slowly bend your opposite leg until you feel a gentle stretch on the inside of your right / left thigh.  Hold for __________ seconds. Repeat __________ times. Complete this exercise __________ times per day.  STRENGTHENING EXERCISES - Pes Anserinus Syndrome  These exercises may help you when beginning  to rehabilitate your injury. They may resolve your symptoms with or without further involvement from your physician, physical therapist or athletic trainer. While completing these exercises, remember:   Muscles can gain both the endurance and the strength needed for everyday activities through controlled exercises.  Complete these exercises as instructed by your physician, physical therapist or athletic trainer. Progress the resistance and repetitions only as guided. STRENGTH - Hamstring, Curls  Lay on your stomach with your legs extended. (If you lay on a bed, your feet may hang over the edge.)  Tighten the muscles in the back of your thigh to bend your right / left knee up to 90 degrees. Keep your hips flat on the bed/floor.  Hold this position for __________ seconds.  Slowly lower your leg back to the starting position. Repeat __________ times. Complete this exercise __________ times per day.  OPTIONAL ANKLE WEIGHTS: Begin with ____________________, but DO NOT exceed ____________________. Increase in 1 lb/0.5 kg increments.  STRENGTH - Hip Adductors, Straight Leg Raises  Lie on your side so that your head, shoulders, knee and hip line up. You may place your upper foot in front to help maintain your balance. Your right / left leg should be on the bottom.  Roll your hips slightly forward, so that your hips are stacked directly over each other and your right / left knee is facing forward.  Tense the muscles in your inner thigh and lift your bottom leg 4-6 inches. Hold this position for __________ seconds.  Slowly lower your leg to the starting position. Allow the muscles to fully relax before beginning the next repetition. Repeat __________ times. Complete this exercise __________ times per day.    This information is not intended to replace advice given to you by your health care provider. Make sure you discuss any questions you have with your health care provider.   Document Released:  07/25/2005 Document Revised: 12/09/2014 Document Reviewed: 11/06/2008 Elsevier Interactive Patient Education [employer].

## 2016-02-23 LAB — STOOL CULTURE

## 2016-02-23 NOTE — Progress Notes (Signed)
Quick Note:  Knee xray shows some arthritis but otherwise looks ok ______

## 2016-02-25 ENCOUNTER — Other Ambulatory Visit: Payer: Self-pay | Admitting: Family Medicine

## 2016-03-07 ENCOUNTER — Ambulatory Visit: Payer: Self-pay | Admitting: Family Medicine

## 2016-03-07 ENCOUNTER — Other Ambulatory Visit: Payer: Self-pay | Admitting: Family Medicine

## 2016-03-11 ENCOUNTER — Ambulatory Visit: Payer: Self-pay | Admitting: Sports Medicine

## 2016-03-18 ENCOUNTER — Other Ambulatory Visit: Payer: Self-pay | Admitting: Family Medicine

## 2016-03-22 ENCOUNTER — Ambulatory Visit: Payer: Self-pay | Admitting: Family Medicine

## 2016-03-25 ENCOUNTER — Other Ambulatory Visit: Payer: Self-pay | Admitting: Family Medicine

## 2016-04-04 ENCOUNTER — Other Ambulatory Visit: Payer: Self-pay | Admitting: Physician Assistant

## 2016-04-11 ENCOUNTER — Other Ambulatory Visit: Payer: Self-pay | Admitting: Family Medicine

## 2016-04-21 ENCOUNTER — Ambulatory Visit (INDEPENDENT_AMBULATORY_CARE_PROVIDER_SITE_OTHER): Payer: Medicaid Other | Admitting: Family Medicine

## 2016-04-21 ENCOUNTER — Encounter: Payer: Self-pay | Admitting: Family Medicine

## 2016-04-21 VITALS — BP 132/78 | HR 85 | Wt 199.0 lb

## 2016-04-21 DIAGNOSIS — Z794 Long term (current) use of insulin: Secondary | ICD-10-CM

## 2016-04-21 DIAGNOSIS — R300 Dysuria: Secondary | ICD-10-CM

## 2016-04-21 DIAGNOSIS — E118 Type 2 diabetes mellitus with unspecified complications: Secondary | ICD-10-CM | POA: Diagnosis not present

## 2016-04-21 DIAGNOSIS — E1165 Type 2 diabetes mellitus with hyperglycemia: Secondary | ICD-10-CM | POA: Diagnosis not present

## 2016-04-21 DIAGNOSIS — IMO0002 Reserved for concepts with insufficient information to code with codable children: Secondary | ICD-10-CM

## 2016-04-21 DIAGNOSIS — E1142 Type 2 diabetes mellitus with diabetic polyneuropathy: Secondary | ICD-10-CM

## 2016-04-21 LAB — POCT URINALYSIS DIPSTICK
BILIRUBIN UA: NEGATIVE
Ketones, UA: NEGATIVE
NITRITE UA: NEGATIVE
Protein, UA: 100
Spec Grav, UA: 1.025
UROBILINOGEN UA: 0.2
pH, UA: 5.5

## 2016-04-21 LAB — POCT GLYCOSYLATED HEMOGLOBIN (HGB A1C): Hemoglobin A1C: 9.9

## 2016-04-21 MED ORDER — AMITRIPTYLINE HCL 50 MG PO TABS: ORAL_TABLET | ORAL | 3 refills | Status: DC

## 2016-04-21 MED ORDER — SIMVASTATIN 40 MG PO TABS: ORAL_TABLET | ORAL | 5 refills | Status: DC

## 2016-04-21 MED ORDER — GABAPENTIN (ONCE-DAILY) 600 MG PO TABS: 1.0000 | ORAL_TABLET | Freq: Three times a day (TID) | ORAL | 5 refills | Status: DC

## 2016-04-21 MED ORDER — GLIPIZIDE 10 MG PO TABS
10.0000 mg | ORAL_TABLET | Freq: Two times a day (BID) | ORAL | 5 refills | Status: DC
Start: 2016-04-21 — End: 2021-02-24

## 2016-04-21 MED ORDER — CIPROFLOXACIN HCL 500 MG PO TABS: 500.0000 mg | ORAL_TABLET | Freq: Two times a day (BID) | ORAL | 0 refills | Status: AC

## 2016-04-21 MED ORDER — INSULIN NPH (HUMAN) (ISOPHANE) 100 UNIT/ML ~~LOC~~ SUSP: 65.0000 [IU] | Freq: Two times a day (BID) | SUBCUTANEOUS | 99 refills | Status: AC

## 2016-04-21 MED ORDER — IBUPROFEN 800 MG PO TABS: 800.0000 mg | ORAL_TABLET | Freq: Two times a day (BID) | ORAL | 3 refills | Status: DC | PRN

## 2016-04-21 NOTE — Patient Instructions (Addendum)
Increase insulin to 65 units twice a day. Increased the Gabapentin to 600 mg 3 x a day.   Will start the amitryptiline at bedtime.   Call me in 2 weeks with your glucose numbers.

## 2016-04-21 NOTE — Progress Notes (Signed)
Subjective:    CC: DM   HPI:  Diabetes - Only 1 hypoglycemic event. No wounds or sores that are not healing well. No increased thirst or urination. Taking medications as prescribed without any side effects.She is concerned because she is gaining weight. She is currently on insulin NPH 60 units twice a day.  Peripheral neuropathy-she still continues to have a lot of severe pain. She's now getting electric shock sensations in her feet and toes particularly at night. She is on gabapentin 300 mg twice a day. She has been taking her daughter's I 800 mg ibuprofen and it does provide some relief. She says she just takes it occasionally because it does seem to upset her stomach a little bit.  She has also had some dysuria on and off of little trace blood in the last couple of months. She denies any fevers or chills. Some back pain.   Past medical history, Surgical history, Family history not pertinant except as noted below, Social history, Allergies, and medications have been entered into the medical record, reviewed, and corrections made.   Review of Systems: No fevers, chills, night sweats, weight loss, chest pain, or shortness of breath.   Objective:    General: Well Developed, well nourished, and in no acute distress.  Neuro: Alert and oriented x3, extra-ocular muscles intact, sensation grossly intact.  HEENT: Normocephalic, atraumatic  Skin: Warm and dry, no rashes. Cardiac: Regular rate and rhythm, no murmurs rubs or gallops, no lower extremity edema.  Respiratory: Clear to auscultation bilaterally. Not using accessory muscles, speaking in full sentences.   Impression and Recommendations:    Diabetes-uncontrolled. A1c is 9.9 today. She has been progressively getting a little bit lower. Discussed the importance of diet. I really want her to cut back on her portion sizes of carbohydrates. In addition I really want her to start increasing her activity level and getting some exercise.  Encouraged her to shoot for 10 or 15 minute walk daily between now when I see her back. Follow-up in 3 months. I'm going to increase her NPH to 65 units twice a day. I encouraged her to call me in about 2 weeks and let me know what her sugars are doing so that we can make another adjustment over the phone between now when I see her back.  Diabetic peripheral neuropathy. We'll go ahead and increase her gabapentin to 6 mg 3 times a day. Also discussed retrying amitriptyline. She tried it previously but like it was causing some vision changes but I'm not sure that this wasn't the elevated glucose levels. She said she would be willing to retry it. I think it helped with pain, her sleep and even help elevate her mood as she has been feeling sad and down recently. She would also like a prescription for 800 mg ibuprofen. She's been taking her daughter's intermittently it does provide some mild relief. She says it does irritate her stomach when she takes it so encouraged to take Nexium if she's, take the ibuprofen.  UTI - we'll go ahead and treat with Cipro. Reminded her again that if we can get her blood sugars under control this will reduce the frequency of bladder and yeast infections that she's been getting.

## 2016-04-27 ENCOUNTER — Ambulatory Visit: Payer: Self-pay | Admitting: Physician Assistant

## 2016-04-29 ENCOUNTER — Other Ambulatory Visit: Payer: Self-pay | Admitting: Family Medicine

## 2016-04-30 ENCOUNTER — Other Ambulatory Visit: Payer: Self-pay | Admitting: Family Medicine

## 2016-05-02 ENCOUNTER — Other Ambulatory Visit: Payer: Self-pay | Admitting: Family Medicine

## 2016-05-03 ENCOUNTER — Telehealth: Payer: Self-pay

## 2016-05-03 NOTE — Telephone Encounter (Signed)
Spoke with [facility] and pt insurance will cover 300 mg at an affordable price.

## 2016-05-03 NOTE — Telephone Encounter (Signed)
[facility] reports that pt gabapentin Rx is a little over $600.  They requesting something cheaper. Please advise.

## 2016-05-03 NOTE — Telephone Encounter (Signed)
Call the pharmacy and find out if a different strength is cheaper. For example if the 300s or 100s or less expensive. She is also the one that one of the once daily dosing so if she is willing to take it multiple times a day it should be cheaper but just verify with the pharmacy.

## 2016-05-04 ENCOUNTER — Other Ambulatory Visit: Payer: Self-pay

## 2016-05-04 MED ORDER — GABAPENTIN 300 MG PO CAPS: 600.0000 mg | ORAL_CAPSULE | Freq: Three times a day (TID) | ORAL | 5 refills | Status: DC

## 2016-05-04 NOTE — Telephone Encounter (Signed)
Ok lets change to 300mg , 2 tabs TID. 180 tabs with 5 RF

## 2016-05-04 NOTE — Telephone Encounter (Signed)
New rx sent

## 2016-05-04 NOTE — Telephone Encounter (Signed)
Per Dr. Madilyn Fireman new Rx of gabapentin (300 mg) sent to [facility] pharmacy.  Sig: take 2 300 mg tabs TID 180 tabs w/5 refills. -EMH/RMA

## 2016-05-05 ENCOUNTER — Telehealth: Payer: Self-pay

## 2016-05-05 NOTE — Telephone Encounter (Signed)
Erin [name] states she still has painful urination and chills. Denies fever, sweats or pelvic pain. She would like another antibiotic sent in. She doesn't have insurance and is unable to come in for an appointment. Please advise.

## 2016-05-05 NOTE — Telephone Encounter (Signed)
Pt called and informed that she can come by anytime tomorrow and do a urine sample before any treatment can be started. Pt voiced understanding and agreed.Audelia Hives [place]

## 2016-05-05 NOTE — Telephone Encounter (Signed)
See if can at least come in for urine culture.  No OV charge.  If she's not feeling better after the first antibiotic and we needed to a culture so that we know what the sensitivities are.

## 2016-05-13 ENCOUNTER — Other Ambulatory Visit: Payer: Self-pay

## 2016-05-13 ENCOUNTER — Other Ambulatory Visit: Payer: Self-pay | Admitting: Family Medicine

## 2016-05-13 DIAGNOSIS — R300 Dysuria: Secondary | ICD-10-CM

## 2016-05-14 LAB — URINALYSIS, MICROSCOPIC ONLY
CASTS: NONE SEEN [LPF]
Crystals: NONE SEEN [HPF]

## 2016-05-14 LAB — URINALYSIS, ROUTINE W REFLEX MICROSCOPIC
Bilirubin Urine: NEGATIVE
KETONES UR: NEGATIVE
NITRITE: NEGATIVE
PH: 5.5 (ref 5.0–8.0)
Specific Gravity, Urine: 1.022 (ref 1.001–1.035)

## 2016-05-15 LAB — URINE CULTURE

## 2016-05-15 MED ORDER — FLUCONAZOLE 150 MG PO TABS: 150.0000 mg | ORAL_TABLET | ORAL | 0 refills | Status: DC

## 2016-05-15 NOTE — Addendum Note (Signed)
Addended by: Beatrice Lecher D on: 05/15/2016 09:26 PM   Modules accepted: Orders

## 2016-05-16 ENCOUNTER — Other Ambulatory Visit: Payer: Self-pay | Admitting: Family Medicine

## 2016-05-19 ENCOUNTER — Telehealth: Payer: Self-pay | Admitting: *Deleted

## 2016-05-19 ENCOUNTER — Other Ambulatory Visit: Payer: Self-pay | Admitting: *Deleted

## 2016-05-19 NOTE — Telephone Encounter (Signed)
lvm informing pt that we cannot continue to write for her bp medications without checking her kidney and liver function. She has not had this done since May 2016. I advised that we can reprint her lab slip and use the self pay stamp for her to go have her labs done ASAP left at front desk.  Her medications will not be sent until she goes to have her labs done. Advised to return call if she has any questions.Erin [name]

## 2016-05-24 ENCOUNTER — Other Ambulatory Visit: Payer: Self-pay | Admitting: Family Medicine

## 2016-05-25 ENCOUNTER — Telehealth: Payer: Self-pay

## 2016-05-25 NOTE — Telephone Encounter (Signed)
Continued from 05/19/16.  Pt advised of recommendations from 1 week ago.  She will get lab done on Friday. -EMH/RMA

## 2016-06-07 ENCOUNTER — Other Ambulatory Visit: Payer: Self-pay | Admitting: Family Medicine

## 2016-06-10 ENCOUNTER — Other Ambulatory Visit: Payer: Self-pay | Admitting: *Deleted

## 2016-06-10 ENCOUNTER — Other Ambulatory Visit: Payer: Self-pay

## 2016-06-10 DIAGNOSIS — R300 Dysuria: Secondary | ICD-10-CM

## 2016-06-10 MED ORDER — ALPRAZOLAM 0.5 MG PO TABS: 0.5000 mg | ORAL_TABLET | Freq: Two times a day (BID) | ORAL | 0 refills | Status: DC | PRN

## 2016-06-10 MED ORDER — ATENOLOL 50 MG PO TABS: 50.0000 mg | ORAL_TABLET | Freq: Every day | ORAL | 1 refills | Status: AC

## 2016-06-10 MED ORDER — SIMVASTATIN 40 MG PO TABS: 40.0000 mg | ORAL_TABLET | Freq: Every day | ORAL | 3 refills | Status: DC

## 2016-06-10 MED ORDER — AMLODIPINE BESYLATE 5 MG PO TABS: 5.0000 mg | ORAL_TABLET | Freq: Every day | ORAL | 1 refills | Status: DC

## 2016-06-10 MED ORDER — LISINOPRIL 40 MG PO TABS: 40.0000 mg | ORAL_TABLET | Freq: Every day | ORAL | 1 refills | Status: DC

## 2016-06-11 LAB — LIPID PANEL
Cholesterol: 179 mg/dL (ref 125–200)
HDL: 32 mg/dL — ABNORMAL LOW (ref >=46)
LDL Cholesterol: 101 mg/dL (ref <130)
Total CHOL/HDL Ratio: 5.6 Ratio — ABNORMAL HIGH (ref <=5.0)
Triglycerides: 231 mg/dL — ABNORMAL HIGH (ref <150)
VLDL: 46 mg/dL — ABNORMAL HIGH (ref <30)

## 2016-06-11 LAB — BASIC METABOLIC PANEL
BUN: 15 mg/dL (ref 7–25)
CO2: 28 mmol/L (ref 20–31)
Calcium: 9.7 mg/dL (ref 8.6–10.4)
Chloride: 107 mmol/L (ref 98–110)
Creat: 1.1 mg/dL — ABNORMAL HIGH (ref 0.50–0.99)
Glucose, Bld: 137 mg/dL — ABNORMAL HIGH (ref 65–99)
Potassium: 4.2 mmol/L (ref 3.5–5.3)
Sodium: 141 mmol/L (ref 135–146)

## 2016-06-28 ENCOUNTER — Telehealth: Payer: Self-pay

## 2016-06-28 NOTE — Telephone Encounter (Signed)
Erin [name] called and left a message stating she would like pain medication for the pain in her ankles and feet. Please advise.

## 2016-06-29 ENCOUNTER — Other Ambulatory Visit: Payer: Self-pay

## 2016-06-29 MED ORDER — MELOXICAM 15 MG PO TABS: 15.0000 mg | ORAL_TABLET | Freq: Every day | ORAL | 2 refills | Status: DC

## 2016-06-29 NOTE — Telephone Encounter (Signed)
Patient advised of medication. She will call back on Monday with her blood sugar numbers.

## 2016-06-29 NOTE — Telephone Encounter (Signed)
Prescription sent for Mobic. She cannot take the ibuprofen with it. Also please see what her blood sugars have been doing. Her last blood sugar was out of control and this is definitely contributing to her neuropathy symptoms. Lab Results  Component Value Date   HGBA1C 9.9 04/21/2016

## 2016-06-29 NOTE — Telephone Encounter (Signed)
What specifically is she asking for? Is there something she has taken before? We are not going to provide narcotics for her at this office.

## 2016-06-29 NOTE — Telephone Encounter (Signed)
She states she doesn't know what medication will work. She has tried ibuprofen and tylenol without relief. She has never tried Mobic.

## 2016-07-03 MED ORDER — MELOXICAM 7.5 MG PO TABS: 7.5000 mg | ORAL_TABLET | Freq: Two times a day (BID) | ORAL | 1 refills | Status: DC | PRN

## 2016-07-03 NOTE — Telephone Encounter (Signed)
There are 2 scripts for meloxicam. Please cancel the 7.5mg .

## 2016-07-07 ENCOUNTER — Other Ambulatory Visit: Payer: Self-pay

## 2016-07-07 NOTE — Telephone Encounter (Signed)
Cancelled prescription.  

## 2016-07-13 ENCOUNTER — Other Ambulatory Visit: Payer: Self-pay | Admitting: Family Medicine

## 2016-07-21 ENCOUNTER — Ambulatory Visit: Payer: Self-pay | Admitting: Family Medicine

## 2016-07-26 ENCOUNTER — Ambulatory Visit (INDEPENDENT_AMBULATORY_CARE_PROVIDER_SITE_OTHER): Payer: Medicaid Other | Admitting: Family Medicine

## 2016-07-26 ENCOUNTER — Encounter: Payer: Self-pay | Admitting: Family Medicine

## 2016-07-26 VITALS — BP 127/83 | HR 95 | Temp 98.6°F | Ht 66.0 in | Wt 197.0 lb

## 2016-07-26 DIAGNOSIS — E118 Type 2 diabetes mellitus with unspecified complications: Secondary | ICD-10-CM

## 2016-07-26 DIAGNOSIS — R197 Diarrhea, unspecified: Secondary | ICD-10-CM

## 2016-07-26 DIAGNOSIS — E1165 Type 2 diabetes mellitus with hyperglycemia: Secondary | ICD-10-CM

## 2016-07-26 DIAGNOSIS — I10 Essential (primary) hypertension: Secondary | ICD-10-CM | POA: Diagnosis not present

## 2016-07-26 DIAGNOSIS — J209 Acute bronchitis, unspecified: Secondary | ICD-10-CM | POA: Diagnosis not present

## 2016-07-26 LAB — POCT GLYCOSYLATED HEMOGLOBIN (HGB A1C): HEMOGLOBIN A1C: 9

## 2016-07-26 MED ORDER — DOXYCYCLINE HYCLATE 100 MG PO TABS: 100.0000 mg | ORAL_TABLET | Freq: Two times a day (BID) | ORAL | 0 refills | Status: DC

## 2016-07-26 MED ORDER — BENZONATATE 200 MG PO CAPS: 200.0000 mg | ORAL_CAPSULE | Freq: Two times a day (BID) | ORAL | 0 refills | Status: DC | PRN

## 2016-07-26 NOTE — Progress Notes (Addendum)
Subjective:    CC: DM  HPI: Diabetes - no hypoglycemic events. No wounds or sores that are not healing well. No increased thirst or urination. Checking glucose at home. Taking medications as prescribed without any side effects.report her meter is broken, but before it broke sugar running in upper 100s./  Also c/o of  cough for 2 days. She's been taking Mucinex. Reports yellow productive sputum. No fevers chills or sweats. + diarrhea about 2 days ago. Now has some right red blood after wiping in the last couple of days.   She also complains about intermittent diarrhea unrelated to her recent illness. She says every week or 2 she'll get some bad smelling burps and then usually within a couple hours she'll just have profuse diarrhea. It's been going on for months. She's not sure what has triggered it.  Past medical history, Surgical history, Family history not pertinant except as noted below, Social history, Allergies, and medications have been entered into the medical record, reviewed, and corrections made.   Review of Systems: No fevers, chills, night sweats, weight loss, chest pain, or shortness of breath.   Objective:    General: Well Developed, well nourished, and in no acute distress.  Neuro: Alert and oriented x3, extra-ocular muscles intact, sensation grossly intact.  HEENT: Normocephalic, atraumatic  Skin: Warm and dry, no rashes. Cardiac: Regular rate and rhythm, no murmurs rubs or gallops, no lower extremity edema.  Respiratory: Clear to auscultation bilaterally. Not using accessory muscles, speaking in full sentences.   Impression and Recommendations:   DM- A1C down to 9.0 form 9.9.  Increase NPH to 70 units BID.  Call me in 2 2 weeks and let me know what sugars are doing.  Uncontrolled but improved.   HTN  - Well controlled. Continue current regimen. Follow up in  3-4 months.   Acute bronchitis  - we'll go ahead and start her on doxycycline. She does have thick chunky yellow  mucus that she is coughing up. I do hear some crackles in the right lower lung which might be evident of an early pneumonia. Continue the Mucinex as well as the [employer]  Intermittent diarrhea-I really think she would best be served by seeing GI for further evaluation and treatment. Her last colonoscopy was 7 years ago area and unfortunately that she doesn't have insurance but she is hoping in the next 90 days she will be able to apply for insurance through her job. Just told her that when she gets it and let me know and we'll see if we can get her referred.

## 2016-07-27 ENCOUNTER — Telehealth: Payer: Self-pay

## 2016-07-27 NOTE — Telephone Encounter (Signed)
Erin [name] called and states the medication for her cough is not helping. She would like a different medication. Please advise.

## 2016-07-27 NOTE — Telephone Encounter (Signed)
She can try delsym OTC.  We are not doing a narcotic cough med.

## 2016-07-27 NOTE — Telephone Encounter (Signed)
Patient advised of recommendations.  

## 2016-08-16 ENCOUNTER — Other Ambulatory Visit: Payer: Self-pay | Admitting: Family Medicine

## 2016-10-24 ENCOUNTER — Ambulatory Visit: Payer: Self-pay | Admitting: Family Medicine

## 2016-10-24 NOTE — Progress Notes (Deleted)
Subjective:    CC:   HPI:  Diabetes - no hypoglycemic events. No wounds or sores that are not healing well. No increased thirst or urination. Checking glucose at home. Taking medications as prescribed without any side effects. Last a1c was  9.0.   Hypertension- Pt denies chest pain, SOB, dizziness, or heart palpitations.  Taking meds as directed w/o problems.  Denies medication side effects.      Past medical history, Surgical history, Family history not pertinant except as noted below, Social history, Allergies, and medications have been entered into the medical record, reviewed, and corrections made.   Review of Systems: No fevers, chills, night sweats, weight loss, chest pain, or shortness of breath.   Objective:    General: Well Developed, well nourished, and in no acute distress.  Neuro: Alert and oriented x3, extra-ocular muscles intact, sensation grossly intact.  HEENT: Normocephalic, atraumatic  Skin: Warm and dry, no rashes. Cardiac: Regular rate and rhythm, no murmurs rubs or gallops, no lower extremity edema.  Respiratory: Clear to auscultation bilaterally. Not using accessory muscles, speaking in full sentences.   Impression and Recommendations:    DM-   HTN -

## 2017-11-08 ENCOUNTER — Other Ambulatory Visit: Payer: Self-pay | Admitting: Internal Medicine

## 2017-11-08 DIAGNOSIS — Z1239 Encounter for other screening for malignant neoplasm of breast: Secondary | ICD-10-CM

## 2017-11-17 ENCOUNTER — Other Ambulatory Visit: Payer: Self-pay | Admitting: Family Medicine

## 2021-02-23 ENCOUNTER — Inpatient Hospital Stay (HOSPITAL_COMMUNITY)
Admission: EM | Admit: 2021-02-23 | Discharge: 2021-02-26 | DRG: 041 | Disposition: A | Discharge status: Home / Self Care | Payer: Medicare Other | Attending: Internal Medicine | Admitting: Internal Medicine

## 2021-02-23 ENCOUNTER — Encounter (HOSPITAL_COMMUNITY): Payer: Self-pay

## 2021-02-23 ENCOUNTER — Emergency Department (HOSPITAL_COMMUNITY): Payer: Medicare Other

## 2021-02-23 ENCOUNTER — Observation Stay (HOSPITAL_COMMUNITY): Payer: Medicare Other

## 2021-02-23 DIAGNOSIS — R4701 Aphasia: Secondary | ICD-10-CM | POA: Diagnosis present

## 2021-02-23 DIAGNOSIS — E785 Hyperlipidemia, unspecified: Secondary | ICD-10-CM | POA: Diagnosis present

## 2021-02-23 DIAGNOSIS — Z800 Family history of malignant neoplasm of digestive organs: Secondary | ICD-10-CM

## 2021-02-23 DIAGNOSIS — Z8673 Personal history of transient ischemic attack (TIA), and cerebral infarction without residual deficits: Secondary | ICD-10-CM

## 2021-02-23 DIAGNOSIS — Z90710 Acquired absence of both cervix and uterus: Secondary | ICD-10-CM

## 2021-02-23 DIAGNOSIS — Z794 Long term (current) use of insulin: Secondary | ICD-10-CM

## 2021-02-23 DIAGNOSIS — Z7983 Long term (current) use of bisphosphonates: Secondary | ICD-10-CM

## 2021-02-23 DIAGNOSIS — M810 Age-related osteoporosis without current pathological fracture: Secondary | ICD-10-CM | POA: Diagnosis present

## 2021-02-23 DIAGNOSIS — R7989 Other specified abnormal findings of blood chemistry: Secondary | ICD-10-CM | POA: Diagnosis present

## 2021-02-23 DIAGNOSIS — Z888 Allergy status to other drugs, medicaments and biological substances status: Secondary | ICD-10-CM

## 2021-02-23 DIAGNOSIS — Z20822 Contact with and (suspected) exposure to covid-19: Secondary | ICD-10-CM | POA: Diagnosis present

## 2021-02-23 DIAGNOSIS — Z85528 Personal history of other malignant neoplasm of kidney: Secondary | ICD-10-CM

## 2021-02-23 DIAGNOSIS — R29810 Facial weakness: Secondary | ICD-10-CM | POA: Diagnosis not present

## 2021-02-23 DIAGNOSIS — K760 Fatty (change of) liver, not elsewhere classified: Secondary | ICD-10-CM | POA: Diagnosis present

## 2021-02-23 DIAGNOSIS — Z79899 Other long term (current) drug therapy: Secondary | ICD-10-CM

## 2021-02-23 DIAGNOSIS — Z791 Long term (current) use of non-steroidal anti-inflammatories (NSAID): Secondary | ICD-10-CM

## 2021-02-23 DIAGNOSIS — E669 Obesity, unspecified: Secondary | ICD-10-CM | POA: Diagnosis present

## 2021-02-23 DIAGNOSIS — Z87891 Personal history of nicotine dependence: Secondary | ICD-10-CM

## 2021-02-23 DIAGNOSIS — K219 Gastro-esophageal reflux disease without esophagitis: Secondary | ICD-10-CM | POA: Diagnosis present

## 2021-02-23 DIAGNOSIS — Z7902 Long term (current) use of antithrombotics/antiplatelets: Secondary | ICD-10-CM

## 2021-02-23 DIAGNOSIS — R531 Weakness: Secondary | ICD-10-CM | POA: Diagnosis present

## 2021-02-23 DIAGNOSIS — G459 Transient cerebral ischemic attack, unspecified: Secondary | ICD-10-CM

## 2021-02-23 DIAGNOSIS — Z6832 Body mass index (BMI) 32.0-32.9, adult: Secondary | ICD-10-CM

## 2021-02-23 DIAGNOSIS — E11319 Type 2 diabetes mellitus with unspecified diabetic retinopathy without macular edema: Secondary | ICD-10-CM | POA: Diagnosis present

## 2021-02-23 DIAGNOSIS — F419 Anxiety disorder, unspecified: Secondary | ICD-10-CM | POA: Diagnosis present

## 2021-02-23 DIAGNOSIS — R29701 NIHSS score 1: Secondary | ICD-10-CM | POA: Diagnosis present

## 2021-02-23 DIAGNOSIS — R55 Syncope and collapse: Secondary | ICD-10-CM | POA: Diagnosis present

## 2021-02-23 DIAGNOSIS — Z7984 Long term (current) use of oral hypoglycemic drugs: Secondary | ICD-10-CM

## 2021-02-23 DIAGNOSIS — Z7982 Long term (current) use of aspirin: Secondary | ICD-10-CM

## 2021-02-23 DIAGNOSIS — Q211 Atrial septal defect: Secondary | ICD-10-CM

## 2021-02-23 DIAGNOSIS — I10 Essential (primary) hypertension: Secondary | ICD-10-CM | POA: Diagnosis present

## 2021-02-23 DIAGNOSIS — Z87442 Personal history of urinary calculi: Secondary | ICD-10-CM

## 2021-02-23 LAB — PROTIME-INR
INR: 1.1 (ref 0.8–1.2)
Prothrombin Time: 14.3 seconds (ref 11.4–15.2)

## 2021-02-23 LAB — CBC
HCT: 34.8 % — ABNORMAL LOW (ref 36.0–46.0)
Hemoglobin: 11.4 g/dL — ABNORMAL LOW (ref 12.0–15.0)
MCH: 28.2 pg (ref 26.0–34.0)
MCHC: 32.8 g/dL (ref 30.0–36.0)
MCV: 86.1 fL (ref 80.0–100.0)
Platelets: 245 10*3/uL (ref 150–400)
RBC: 4.04 MIL/uL (ref 3.87–5.11)
RDW: 14.5 % (ref 11.5–15.5)
WBC: 10 10*3/uL (ref 4.0–10.5)
nRBC: 0 % (ref 0.0–0.2)

## 2021-02-23 LAB — ECHOCARDIOGRAM COMPLETE
AR max vel: 3.52 cm2
AV Area VTI: 3.94 cm2
AV Area mean vel: 3.38 cm2
AV Mean grad: 3 mmHg
AV Peak grad: 4.8 mmHg
Ao pk vel: 1.09 m/s
Area-P 1/2: 3.91 cm2
Height: 66 in
Weight: 3238.12 oz

## 2021-02-23 LAB — COMPREHENSIVE METABOLIC PANEL
ALT: 16 U/L (ref 0–44)
AST: 16 U/L (ref 15–41)
Albumin: 3.1 g/dL — ABNORMAL LOW (ref 3.5–5.0)
Alkaline Phosphatase: 63 U/L (ref 38–126)
Anion gap: 10 (ref 5–15)
BUN: 25 mg/dL — ABNORMAL HIGH (ref 8–23)
CO2: 25 mmol/L (ref 22–32)
Calcium: 9.3 mg/dL (ref 8.9–10.3)
Chloride: 105 mmol/L (ref 98–111)
Creatinine, Ser: 1.86 mg/dL — ABNORMAL HIGH (ref 0.44–1.00)
GFR, Estimated: 30 mL/min — ABNORMAL LOW (ref >60)
Glucose, Bld: 179 mg/dL — ABNORMAL HIGH (ref 70–99)
Potassium: 4.3 mmol/L (ref 3.5–5.1)
Sodium: 140 mmol/L (ref 135–145)
Total Bilirubin: 0.6 mg/dL (ref 0.3–1.2)
Total Protein: 6.9 g/dL (ref 6.5–8.1)

## 2021-02-23 LAB — DIFFERENTIAL
Abs Immature Granulocytes: 0.06 10*3/uL (ref 0.00–0.07)
Basophils Absolute: 0.1 10*3/uL (ref 0.0–0.1)
Basophils Relative: 1 %
Eosinophils Absolute: 0.7 10*3/uL — ABNORMAL HIGH (ref 0.0–0.5)
Eosinophils Relative: 7 %
Immature Granulocytes: 1 %
Lymphocytes Relative: 25 %
Lymphs Abs: 2.5 10*3/uL (ref 0.7–4.0)
Monocytes Absolute: 0.8 10*3/uL (ref 0.1–1.0)
Monocytes Relative: 8 %
Neutro Abs: 5.8 10*3/uL (ref 1.7–7.7)
Neutrophils Relative %: 58 %

## 2021-02-23 LAB — CBG MONITORING, ED
Glucose-Capillary: 168 mg/dL — ABNORMAL HIGH (ref 70–99)
Glucose-Capillary: 125 mg/dL — ABNORMAL HIGH (ref 70–99)

## 2021-02-23 LAB — VITAMIN B12: Vitamin B-12: 1148 pg/mL — ABNORMAL HIGH (ref 180–914)

## 2021-02-23 LAB — I-STAT CHEM 8, ED
BUN: 24 mg/dL — ABNORMAL HIGH (ref 8–23)
Calcium, Ion: 1.19 mmol/L (ref 1.15–1.40)
Chloride: 106 mmol/L (ref 98–111)
Creatinine, Ser: 1.8 mg/dL — ABNORMAL HIGH (ref 0.44–1.00)
Glucose, Bld: 173 mg/dL — ABNORMAL HIGH (ref 70–99)
HCT: 33 % — ABNORMAL LOW (ref 36.0–46.0)
Hemoglobin: 11.2 g/dL — ABNORMAL LOW (ref 12.0–15.0)
Potassium: 4.3 mmol/L (ref 3.5–5.1)
Sodium: 141 mmol/L (ref 135–145)
TCO2: 25 mmol/L (ref 22–32)

## 2021-02-23 LAB — SARS CORONAVIRUS 2 (TAT 6-24 HRS): SARS Coronavirus 2: NEGATIVE

## 2021-02-23 LAB — APTT: aPTT: 27 seconds (ref 24–36)

## 2021-02-23 LAB — TSH: TSH: 1.075 u[IU]/mL (ref 0.350–4.500)

## 2021-02-23 LAB — HEMOGLOBIN A1C
Hgb A1c MFr Bld: 8 % — ABNORMAL HIGH (ref 4.8–5.6)
Mean Plasma Glucose: 182.9 mg/dL

## 2021-02-23 MED ORDER — INSULIN ASPART 100 UNIT/ML IJ SOLN
0.0000 [IU] | Freq: Three times a day (TID) | INTRAMUSCULAR | Status: DC
  Administered 2021-02-24 (×2): 15 [IU] via SUBCUTANEOUS
  Administered 2021-02-24: 8 [IU] via SUBCUTANEOUS
  Administered 2021-02-25: 5 [IU] via SUBCUTANEOUS

## 2021-02-23 MED ORDER — STROKE: EARLY STAGES OF RECOVERY BOOK
Freq: Once | Status: AC
  Filled 2021-02-23: qty 1

## 2021-02-23 MED ORDER — LORAZEPAM 2 MG/ML IJ SOLN
0.5000 mg | Freq: Once | INTRAMUSCULAR | Status: DC
  Filled 2021-02-23: qty 1

## 2021-02-23 MED ORDER — ATORVASTATIN CALCIUM 80 MG PO TABS
80.0000 mg | ORAL_TABLET | Freq: Every day | ORAL | Status: DC
  Administered 2021-02-24 – 2021-02-26 (×3): 80 mg via ORAL
  Filled 2021-02-23 (×3): qty 1

## 2021-02-23 MED ORDER — ACETAMINOPHEN 325 MG PO TABS: 650.0000 mg | ORAL_TABLET | ORAL | Status: DC | PRN

## 2021-02-23 MED ORDER — ACETAMINOPHEN 650 MG RE SUPP: 650.0000 mg | RECTAL | Status: DC | PRN

## 2021-02-23 MED ORDER — SENNOSIDES-DOCUSATE SODIUM 8.6-50 MG PO TABS: 1.0000 | ORAL_TABLET | Freq: Every evening | ORAL | Status: DC | PRN

## 2021-02-23 MED ORDER — ACETAMINOPHEN 160 MG/5ML PO SOLN: 650.0000 mg | ORAL | Status: DC | PRN

## 2021-02-23 MED ORDER — ASPIRIN EC 81 MG PO TBEC
81.0000 mg | DELAYED_RELEASE_TABLET | Freq: Every day | ORAL | Status: DC
  Administered 2021-02-23 – 2021-02-26 (×4): 81 mg via ORAL
  Filled 2021-02-23 (×4): qty 1

## 2021-02-23 MED ORDER — LORAZEPAM 2 MG/ML IJ SOLN
0.2500 mg | Freq: Once | INTRAMUSCULAR | Status: AC
  Administered 2021-02-23: 0.25 mg via INTRAVENOUS

## 2021-02-23 MED ORDER — ATORVASTATIN CALCIUM 40 MG PO TABS: 40.0000 mg | ORAL_TABLET | Freq: Every day | ORAL | Status: DC

## 2021-02-23 MED ORDER — SODIUM CHLORIDE 0.9% FLUSH: 3.0000 mL | Freq: Once | INTRAVENOUS | Status: DC

## 2021-02-23 MED ORDER — PANTOPRAZOLE SODIUM 40 MG PO TBEC
40.0000 mg | DELAYED_RELEASE_TABLET | Freq: Every day | ORAL | Status: DC
  Administered 2021-02-24 – 2021-02-26 (×3): 40 mg via ORAL
  Filled 2021-02-23 (×3): qty 1

## 2021-02-23 MED ORDER — CLOPIDOGREL BISULFATE 75 MG PO TABS
75.0000 mg | ORAL_TABLET | Freq: Every day | ORAL | Status: DC
  Administered 2021-02-23 – 2021-02-25 (×3): 75 mg via ORAL
  Filled 2021-02-23 (×3): qty 1

## 2021-02-23 NOTE — Consult Note (Signed)
Neurology consult   CC: code stroke.  History is obtained from: EMS and chart.   HPI: 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 Patient presents today via EMS after being at her pool this am and having trouble getting out of pool due to BLE weakness. Observes noted a left facial droop and patient stated she was having difficulty getting her words out. LKW 12 noon. By the time patient had arrived, her symptoms were resolved. CBG 180 and BP 156/70 en route.   After brief exam on the ED bridge and for airway clearance, patient was taken emergently to CT suite. Given symptoms were resolved, this is likely a TIA and patient did not qualify for tPA. CTA head and neck not performed due to low suspicion for LVO.   Patient stated she is on a blood thinner but did not no the name. NP failed to reach her daughter. Note from [facility] on chart from CVA f/up that indicated patient took Plavix 75mg  po qd with ASA 81mg  po qd x 21 days, then just ASA 325mg  po qd.    In review of chart, patient had TIA symptoms after a fall at home on 12/12/20 at [facility] with initial NIH of 1. It was thought to be consistent with a TIA, but MRI brain showed a tiny infarct in the right posterior temporal lobe, but the location did not coincide with her symptoms. Her LDL was 182 and Lipitor 80mg  po qd was started. Due to Vitamin B12 level of 183, patient was started on supplementation. Also mentioned was post-concussive syndrome secondary to her fall. NP can not locate any additional neurological f/up notes.   LKW:1200 hours tpa given?: No as per HPI.  IR Thrombectomy?: No, low suspicion for LVO.  MRS:2  NIHSS:  1a Level of Consciousness: 0                       F/up NIHSS: 1  1b LOC Questions: 0 1c LOC Commands: 0 2 Best Gaze: 0 3 Visual: 0 4  Facial Palsy: 0 5a Motor Arm - left: 0 5b Motor Arm - Right: 0 6a Motor Leg - Left: 1 6b Motor Leg - Right: 1 7 Limb Ataxia: 0 8 Sensory: 0 9 Best Language: 0 10 Dysarthria: 0 11 Extinction and Inattention: 0 TOTAL:  2  ROS: A robust ROS was unable to be performed due to emergent nature of event. No HA, numbness or tingling, no n/v, no vision changes.   Past Medical History:  Diagnosis Date   Anxiety    Diabetes mellitus    Fatty liver    Hypertension    Kidney stones    Obesity     Family History  Problem Relation Age of Onset   Cancer Mother        breast and kidney   Kidney disease Son    Cancer Other        pancreatic cancer   Social History:  reports that she quit smoking about 17 years ago. Her smoking use included cigarettes. She has never used smokeless tobacco. She reports that she does not drink alcohol and does not use drugs.   Prior to Admission medications   Medication Sig Start Date End Date Taking? Authorizing Provider  ALPRAZolam Duanne Moron) 0.5 MG  tablet Take 1 tablet (0.5 mg total) by mouth 2 (two) times daily as needed for anxiety. 08/16/16   Hali Marry, MD  AMBULATORY NON FORMULARY MEDICATION Accucheck Aviva Plus meter and test strips Please check glucose 3 times daily. Dx: E11.8 11/12/14   Donella Stade, PA-C  amitriptyline (ELAVIL) 50 MG tablet 1/2 tab po QHS x 1 week, then ok to increase to whole tab lately. 04/21/16   Hali Marry, MD  amLODipine (NORVASC) 5 MG tablet Take 1 tablet (5 mg total) by mouth daily. 06/10/16   Hali Marry, MD  aspirin 81 MG tablet Take 81 mg by mouth daily.      [provider]  atenolol (TENORMIN) 50 MG tablet Take 1 tablet (50 mg total) by mouth daily. 06/10/16   Hali Marry, MD  gabapentin (NEURONTIN) 300 MG capsule Take 2 capsules (600 mg total) by mouth 3 (three) times daily. 05/04/16   Hali Marry, MD  glipiZIDE (GLUCOTROL) 10 MG tablet Take 1 tablet (10 mg total)  by mouth 2 (two) times daily before a meal. 04/21/16   Hali Marry, MD  insulin NPH Human (NOVOLIN N) 100 UNIT/ML injection Inject 0.65 mLs (65 Units total) into the skin 2 (two) times daily before a meal. 04/21/16   Hali Marry, MD  lisinopril (PRINIVIL,ZESTRIL) 40 MG tablet Take 1 tablet (40 mg total) by mouth daily. 06/10/16   Hali Marry, MD  meloxicam (MOBIC) 15 MG tablet Take 1 tablet (15 mg total) by mouth daily. 06/29/16   Hali Marry, MD  NEEDLE, DISP, 30 G (BD DISP NEEDLES) 30G X 1/2" MISC Inject into skin twice daily before meals. 11/03/15   Hali Marry, MD  simvastatin (ZOCOR) 40 MG tablet Take 1 tablet (40 mg total) by mouth daily at 6 PM. 06/10/16   Hali Marry, MD  Syringe, Disposable, 1 ML MISC Inject into skin twice daily before meals. 11/03/15   Hali Marry, MD    Exam: Current vital signs: BP 117/79 (BP Location: Right Arm)   Pulse 92   Temp 98.1 F (36.7 C) (Oral)   Resp 17   Ht 5\' 6"  (1.676 m)   Wt 91.8 kg   SpO2 93%   BMI 32.67 kg/m   Physical Exam  Constitutional: Appears well-developed and well-nourished.  Psych: Affect appropriate to situation. Eyes: No scleral injection. HENT: No OP obstruction. Head: Normocephalic.  Cardiovascular: Normal rate and regular rhythm.  Respiratory: Effort normal.  GI: Abdomen soft.  No distension. There is no tenderness.  Skin: WDI  Neuro: Mental Status: Patient is awake, alert, oriented to person, place, month, year, and situation. Follows commands.  Patient is able to give a clear and coherent history. No signs of neglect. Speech/Language:  Speech is clear, fluent without dysarthria or aphasia. Repetition, naming, and comprehension intact.  Cranial Nerves: II: Visual Fields are full. Pupils are equal, round, and reactive to light.  III,IV, VI: EOMI without ptosis or diploplia.  V: Facial sensation is symmetric to light touch in V1, V2, and V3. VII:  Facial movement symmetrical. No droop.  VIII: hearing is intact to voice. X: Uvula elevates symmetrically. XI: Shoulder shrug is symmetric. XII: tongue is midline without atrophy or fasciculations.  Motor: 5/5 strength in all muscle groups tested.  Sensory: Sensation is symmetric to light touch in all fours extremities. Extinction absent to light touch DSS.  Plantars: Toes are downgoing bilaterally.  Cerebellar: No ataxia noted with  FNF and HKS bilaterally.   I have reviewed labs in epic and the pertinent results are: INR  1.1   aPTT  27    creatinine 1.80 with 1.10 in 2017.   MD reviewed the images obtained:  NCT head  1. No evidence of acute large vascular territory infarct or acute hemorrhage.ASPECTS is 10 2. Moderate chronic microvascular ischemic disease. An MRI could provide more sensitive evaluation for acute white matter infarct clinically indicated. 3. 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 Because her symptoms had resolved on arrival, this is more likely a TIA. We will keep her in ED until she is out of the window for tPA which is at 1630 hours. Should patient's symptoms recur, recall code stroke and tPA clock will start from the onset of r/p symptoms. She is taking ASA 325mg  po qd per her neurologist in May 2022. Given this fresh TIA, multiple stroke risk factors, and ABCD2 is 4, will restart DAPT. She will need MRI brain as it is more sensitive for stroke.   Impression:  -TIA vs Stroke.  -Multiple stroke risk factors.   Plan: - Medicine admit if no further symptoms in tPA window til 1630 hours. If tPA given, neuro will admit.   - MRI brain without contrast. - Recommend vascular imaging with MRA head and neck. - Recommend TTE. - Recommend labs: HbA1c,  lipid panel, TSH, and Vitamin B12 level given her low level in May and supplementation. - She is on Lipitor 80mg  po qd after last stroke. Goal LDL < 70. -Plavix 75mg  po qd x 21 days.  -Aspirin 81mg  po qd x 21 days. -After 21 days, continue ASA alone.  - SBP goal - Permissive hypertension first 24 h < 220/110 unless tPA is given later. Hold home medications for now. - Telemetry monitoring for arrhythmia. - bedside Swallow screen. - Stroke education. - PT/OT/SLP consult. - NIHSS as per protocol. - frequent neuro checks.  - Recommend metabolic/infectious workup with UA with UCx, CXR, CK, serum lactate. -She will need out patient f/up with her own neurologist 4 weeks after discharge.   Patient seen by Clance Boll, MSN, APN-BC, nurse practitioner and by MD. Note/plan to be edited by MD as needed.  Pager: [phone]  Neurology Attending Attestation   I examined the patient and discussed plan with Ms. Lionel December NP. Above note has been edited by me to reflect my findings and recommendations. I was present throughout the stroke code and made all significant decisions and personally reviewed CNS imaging.   Su Monks, MD [facility] [phone]   If 7pm- 7am, please page neurology on call as listed in [place].

## 2021-02-23 NOTE — Progress Notes (Signed)
  Echocardiogram 2D Echocardiogram has been performed.  Elmer Ramp 02/23/2021, 4:37 PM

## 2021-02-23 NOTE — H&P (Signed)
Date: 02/23/2021               Patient Name:  Erin [name] MRN: [id number]  DOB: 1956-01-24 Age / Sex: 65 y.o., female   PCP: [facility] [place] Service: Internal Medicine Teaching Service              Attending Physician: Dr. Sid Falcon, MD    First Contact: Erin Claude, MS 4 Pager: [phone]  Second Contact: Dr. Cathleen Corti Pager: [phone]  Third Contact Dr. Laural Golden Pager: [phone]       After Hours (After 5p/  First Contact Pager: [phone]  weekends / holidays): Second Contact Pager: [phone]   Chief Complaint: One sided weakness   History of Present Illness:  Erin [name] is 65 y.o. female with history of diabetes, HTN and recent CVA and anxiety who was brought in by EMS for acute onset left sided weakness. Patient says she an family were in the pool this afternoon and when she attempted out of the pool, she felt some weakness on her left side and felt like she was being pulled down. Bystanders helped her out of the pool and called EMS.  She reports intermittent changes in vision and weakness days leading to today's event. Patient also disclosed to [place] that she has had multiple similar episodes over the past couple of weeks but did not tell her family. Patient endorsed this episode feels the same like her previous stroke, only that this one lasted shorter. Patient endorsed transient facial droop and weakness that has since resolved. Has had intermittent changes in vision and has seen ophthalmology recently. Denies chest pain, irregular heart beat, light headedness and adherence to all medications given since last CVA event. Patient reports a bad fall few months ago where she hit her head and thinks the fall must have precipitated the subsequent strokes that developed. Says her husband thought she may have tripped on a concrete but she didn't think she tripped and not quite sure what made her fell that time. CT head w/o contrast contrast obtained in the  ED showed no evidence of acute large vascular territory infarct or acute hemorrhage but Moderate chronic microvascular ischemic disease. 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 Patient is close to her baseline but with some residual weakness.  Unfortunately, we are not able to see all of patients records of treatment for previous stroke  in Care Everywhere.   Meds:  Amlodipine 10 mg Lisinopril 40 mg Atenolol 50 mg Simvastatin 80 mg Pregabalin 200 mg 3 times daily Hydralazine 25 mg every 8 hours Trulicity 3 mg weekly Xanax 0.25 mg daily as needed  Allergies: Allergies as of 02/23/2021 - Review Complete 02/23/2021  Allergen Reaction Noted   Amitriptyline Other (See Comments) 10/31/2013   Hydrochlorothiazide  06/22/2006   Liraglutide Nausea And Vomiting 03/19/2010   Metformin Swelling 03/19/2010   Nortriptyline Other (See Comments) 07/14/2015   Prednisone  04/04/2014   Tramadol Nausea And Vomiting 10/31/2013   Farxiga [dapagliflozin]  04/08/2014   Past Medical History:  Diagnosis Date   Anxiety    Diabetes mellitus    Fatty liver    Hypertension    Kidney stones    Obesity     Family History: Unknown, patient not aware of any known family medical conditions.   Social History: Patient lives with step dad with dementia and currently the primary care giver. Never  smoker, does not drink alcohol or use drugs.  Review of Systems: A complete ROS was negative except as per HPI.   Vitals: Blood pressure 135/80, pulse 89, temperature 98.1 F (36.7 C), temperature source Oral, resp. rate 18, height 5\' 6"  (1.676 m), weight 91.8 kg, SpO2 94 %.   Physical Exam General: alert, appears stated age, in no acute distress HEENT: Normocephalic, atraumatic, EOM intact, conjunctiva normal CV: Regular rate and rhythm, no murmurs rubs or gallops Pulm: Clear to auscultation bilaterally, normal work of breathing Abdomen: Soft, nondistended, bowel sounds present,  no tenderness to palpation MSK: No lower extremity edema Skin: Warm and dry Neuro: Alert and oriented x3, decreased sensation of the left lower extremity dorsal surface, strength 5 out of 5 of bilateral upper and lower extremities, no cranial nerve deficits, upper extremity reflexes intact  EKG: unremarkable, Sinus rhythm  CXR: none  Assessment & Plan by Problem: Active Problems:   Transient ischemic attack  Erin [name] is a 65 y.o female with a history of DM, HTN, anxiety and recent CVA who presented with transient left sided weakness and facial droop consistent with TIA versus stroke and currently undergoing work-up for secondary stroke prevention  TIA versus stroke Patient presented with left-sided weakness and facial droop.  CTA negative for acute intracranial abnormality.  MRI pending.  She has multiple stroke risk factors and recently had a stroke in May 2022.  She took aspirin and Plavix for 3 weeks and has continued on aspirin 325 mg daily since May.  Neurology is following recommended restarting dual antiplatelet therapy.  No tPA given as patient symptoms improved within the tPA window. -Follow-up MRI brain -MRA head and neck -Follow-up TTE -Follow-up hemoglobin A1c, lipid panel, TSH and vitamin B12 per neurology -Continue Lipitor 80 mg -Plavix 75 mg and aspirin 81 mg daily for 3 weeks followed with aspirin alone -Allow for permissive hypertension for the first 24 hours, goal less than 220/110 -Cardiac monitoring -PT/OT consult -Frequent neurochecks  Elevated creatinine Creatinine on admission 1.86, BUN 25.  Baseline creatinine unclear, last admission in May showed a creatinine of 1.86, no GFR from this admission is noted.  GFR was decreased in the 20s to 30s during her last hospitalization.  This will need to be followed. -Trend BMP -Avoid nephrotoxic agents  Hypertension Home medications include atenolol 50 mg and hydralazine 25 mg 3 times daily.  Holding home  medications in the setting of permissive hypertension for 24 hours, goal less than 220/110.  DM Per chart review multiple different diabetic medications listed, will need to confirm with patient her diabetic regimen.  Medications include Trulicity 3 mg weekly, NovoLog 15 units daily with meals and insulin 80 units twice daily with meals. -Follow-up hemoglobin A1c -SSI -Continue Lyrica 200 mg 3 times daily -Confirm diabetes regimen with patient tomorrow  Anxiety Home medications xanax 0.25mg  daily prn  GERD Continue omeprazole 40 mg daily  Osteoporosis Fosamax 70 mg every 7 days  Diet: Carb modified IV fluids: None Full code  Dispo: Admit patient to Inpatient with expected length of stay greater than 2 midnights.  Signed: Mike Craze, DO 02/23/2021, 6:14 PM

## 2021-02-23 NOTE — Code Documentation (Signed)
Stroke Response Nurse Documentation Code Documentation  Erin [name] is a 65 y.o. female arriving to [place] ED via [facility] on 02/23/2021 with past medical hx of TIA. Code stroke was activated by EMS. Patient from pool where she was LKW at 1200 and now complaining of left facial droop and patient having trouble getting out of the pool. She was in the pool and then when trying to get out, she was not able to. Bystanders helped her out and then called EMS.   Stroke team at the bedside on patient arrival. Labs drawn and patient cleared for CT. Patient to CT with team. NIHSS 3, see documentation for details and code stroke times. Patient with disoriented and bilateral leg weakness on exam. The following imaging was completed: CT. Symptoms improved while in CT Scanner. Patient is not a candidate for tPA due to being too mild to treat.   Care/Plan: Q30 mNIHSS/VS until 1630 or patient fully resolves.   Bedside handoff with ED RN Ma Katrina.    Kathrin Greathouse  Stroke Response RN

## 2021-02-23 NOTE — ED Notes (Signed)
Pt reporting continuing weakness in legs, pt up from bed without assistance and requested to walk to restroom. Pt was monitored, but ambulated without assistance and steady gait to RR and back to room. No speech deficits or facial asymmetry noted.

## 2021-02-23 NOTE — ED Notes (Signed)
Pt is asking for anxiety meds. Reports taking Xanax BID but unable to remember dose. Dr.Zammit notified.

## 2021-02-23 NOTE — ED Provider Notes (Addendum)
[place] EMERGENCY DEPARTMENT Provider Note   CSN: [id number] Arrival date & time: 02/23/21  1309     History Chief Complaint  Patient presents with  . Code Stroke    Erin [name] is a 65 y.o. female.  Patient states she was getting in the pool and was started to feel very weak.  She also had some slurred speech and facial droop according to people around her.  This is resolved now  The history is provided by the patient and medical records. No language interpreter was used.  Weakness Severity:  Moderate Onset quality:  Sudden Duration:  30 minutes Timing:  Rare Progression:  Resolved Chronicity:  New Context: not alcohol use   Relieved by:  Nothing Worsened by:  Nothing Ineffective treatments:  None tried Associated symptoms: no abdominal pain, no chest pain, no cough, no diarrhea, no frequency, no headaches and no seizures       Past Medical History:  Diagnosis Date  . Anxiety   . Diabetes mellitus   . Fatty liver   . Hypertension   . Kidney stones   . Obesity     Patient Active Problem List   Diagnosis Date Noted  . Right knee pain 02/22/2016  . Encopresis 11/24/2015  . Uncontrolled type 2 diabetes mellitus with complication, with long-term current use of insulin ([place]) 06/17/2015  . Diabetic polyneuropathy associated with type 2 diabetes mellitus ([place]) 10/03/2014  . Bilateral ankle pain 03/21/2014  . Obesity (BMI 30-39.9) 10/31/2013  . Atherosclerosis of left carotid artery 06/05/2013  . FATTY LIVER DISEASE 06/15/2010  . HYPERGLYCEMIA 04/26/2010  . OTHER NONSPECIFIC ABNORMAL SERUM ENZYME LEVELS 04/26/2010  . BACK PAIN WITH RADICULOPATHY 06/02/2009  . HYPOXEMIA 06/02/2009  . COLONIC POLYPS 04/23/2009  . DIVERTICULOSIS OF COLON 04/23/2009  . POSTMENOPAUSAL STATUS 02/13/2009  . MICROALBUMINURIA 11/13/2007  . GAD (generalized anxiety disorder) 03/14/2007  . OBESITY NOS 12/26/2006  . Diabetes mellitus type 2, uncontrolled, with  complications ([place]) 33/00/7622  . Dyslipidemia 05/16/2006  . HYPERTENSION, BENIGN SYSTEMIC 05/16/2006    Past Surgical History:  Procedure Laterality Date  . ABDOMINAL HYSTERECTOMY     complete with BSO for dysplasia  . [place]ITH[place]RIP[place]  08[place]     OB History   No obstetric history on file.     Family History  Problem Relation Age of Onset  . Cancer Mother        breast and kidney  . Kidney disease Son   . Cancer Other        pancreatic cancer    Social History   Tobacco Use  . Smoking status: Former    Types: Cigarettes    Quit date: 08/09/2003    Years since quitting: 17.5  . Smokeless tobacco: Never  Vaping Use  . Vaping Use: Never used  Substance Use Topics  . Alcohol use: No  . Drug use: No    Home Medications Prior to Admission medications   Medication Sig Start Date End Date Taking? Authorizing Provider  ALPRAZolam Duanne Moron) 0.5 MG tablet Take 1 tablet (0.5 mg total) by mouth 2 (two) times daily as needed for anxiety. 08/16/16   Hali Marry, MD  AMBULATORY NON FORMULARY MEDICATION Accucheck Aviva Plus meter and test strips Please check glucose 3 times daily. Dx: E11.8 11/12/14   Donella Stade, PA-C  amitriptyline (ELAVIL) 50 MG tablet 1/2 tab po QHS x 1 week, then ok to increase to whole tab lately. 04/21/16  Hali Marry, MD  amLODipine (NORVASC) 10 MG tablet Take 10 mg by mouth daily. 12/19/20   [provider]  aspirin 81 MG tablet Take 81 mg by mouth daily.      [provider]  atenolol (TENORMIN) 50 MG tablet Take 1 tablet (50 mg total) by mouth daily. 06/10/16   Hali Marry, MD  atorvastatin (LIPITOR) 80 MG tablet Take 80 mg by mouth daily. 02/04/21   [provider]  gabapentin (NEURONTIN) 300 MG capsule Take 2 capsules (600 mg total) by mouth 3 (three) times daily. 05/04/16   Hali Marry, MD  glipiZIDE (GLUCOTROL) 10 MG tablet Take 1 tablet (10 mg total) by mouth 2 (two) times daily  before a meal. 04/21/16   Hali Marry, MD  insulin NPH Human (NOVOLIN N) 100 UNIT/ML injection Inject 0.65 mLs (65 Units total) into the skin 2 (two) times daily before a meal. 04/21/16   Hali Marry, MD  lisinopril (PRINIVIL,ZESTRIL) 40 MG tablet Take 1 tablet (40 mg total) by mouth daily. 06/10/16   Hali Marry, MD  meloxicam (MOBIC) 15 MG tablet Take 1 tablet (15 mg total) by mouth daily. 06/29/16   Hali Marry, MD  NEEDLE, DISP, 30 G (BD DISP NEEDLES) 30G X 1/2" MISC Inject into skin twice daily before meals. 11/03/15   Hali Marry, MD  simvastatin (ZOCOR) 40 MG tablet Take 1 tablet (40 mg total) by mouth daily at 6 PM. 06/10/16   Hali Marry, MD  Syringe, Disposable, 1 ML MISC Inject into skin twice daily before meals. 11/03/15   Hali Marry, MD    Allergies    Amitriptyline, Hydrochlorothiazide, Liraglutide, Metformin, Nortriptyline, Prednisone, Tramadol, and Farxiga [dapagliflozin]  Review of Systems   Review of Systems  Constitutional:  Negative for appetite change and fatigue.  HENT:  Negative for congestion, ear discharge and sinus pressure.   Eyes:  Negative for discharge.  Respiratory:  Negative for cough.   Cardiovascular:  Negative for chest pain.  Gastrointestinal:  Negative for abdominal pain and diarrhea.  Genitourinary:  Negative for frequency and hematuria.  Musculoskeletal:  Negative for back pain.  Skin:  Negative for rash.  Neurological:  Positive for weakness. Negative for seizures and headaches.  Psychiatric/Behavioral:  Negative for hallucinations.    Physical Exam Updated Vital Signs BP 124/79   Pulse 93   Temp 98.1 F (36.7 C) (Oral)   Resp 15   Ht 5\' 6"  (1.676 m)   Wt 91.8 kg   SpO2 98%   BMI 32.67 kg/m   Physical Exam Vitals and nursing note reviewed.  Constitutional:      Appearance: She is well-developed.  HENT:     Head: Normocephalic.  Eyes:     General: No scleral icterus.     Conjunctiva/sclera: Conjunctivae normal.  Neck:     Thyroid: No thyromegaly.  Cardiovascular:     Rate and Rhythm: Normal rate and regular rhythm.     Heart sounds: No murmur heard.   No friction rub. No gallop.  Pulmonary:     Breath sounds: No stridor. No wheezing or rales.  Chest:     Chest wall: No tenderness.  Abdominal:     General: There is no distension.     Tenderness: There is no abdominal tenderness. There is no rebound.  Musculoskeletal:        General: Normal range of motion.     Cervical back: Neck supple.  Lymphadenopathy:  Cervical: No cervical adenopathy.  Skin:    Findings: No erythema or rash.  Neurological:     Mental Status: She is alert and oriented to person, place, and time.     Motor: No abnormal muscle tone.     Coordination: Coordination normal.  Psychiatric:        Behavior: Behavior normal.    ED Results / Procedures / Treatments   Labs (all labs ordered are listed, but only abnormal results are displayed) Labs Reviewed  CBC - Abnormal; Notable for the following components:      Result Value   Hemoglobin 11.4 (*)    HCT 34.8 (*)    All other components within normal limits  DIFFERENTIAL - Abnormal; Notable for the following components:   Eosinophils Absolute 0.7 (*)    All other components within normal limits  COMPREHENSIVE METABOLIC PANEL - Abnormal; Notable for the following components:   Glucose, Bld 179 (*)    BUN 25 (*)    Creatinine, Ser 1.86 (*)    Albumin 3.1 (*)    GFR, Estimated 30 (*)    All other components within normal limits  CBG MONITORING, ED - Abnormal; Notable for the following components:   Glucose-Capillary 168 (*)    All other components within normal limits  I-STAT CHEM 8, ED - Abnormal; Notable for the following components:   BUN 24 (*)    Creatinine, Ser 1.80 (*)    Glucose, Bld 173 (*)    Hemoglobin 11.2 (*)    HCT 33.0 (*)    All other components within normal limits  PROTIME-INR  APTT   HEMOGLOBIN A1C  TSH  VITAMIN B12  CBG MONITORING, ED    EKG None  Radiology CT HEAD CODE STROKE WO CONTRAST  Result Date: 02/23/2021 CLINICAL DATA:  Code stroke.  Neuro deficit, acute stroke suspected. EXAM: CT HEAD WITHOUT CONTRAST TECHNIQUE: Contiguous axial images were obtained from the base of the skull through the vertex without intravenous contrast. COMPARISON:  Dec 11, 2020. (Dec 17, 2020 CT head not retrievable at the time of dictation). FINDINGS: Brain: No evidence of acute infarction, hemorrhage, hydrocephalus, or mass lesion. 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 Moderate patchy white matter hypoattenuation, nonspecific but most likely related to chronic microvascular ischemic disease. Vascular: No hyperdense vessel identified. Calcific intracranial atherosclerosis. Skull: No acute fracture. Nonspecific lucent lesion in the right frontal calvarium, unchanged from prior. Sinuses/Orbits: Right maxillary sinus retention cyst. Other: No mastoid effusions. ASPECTS [facility] Stroke Program Early CT Score) Total score (0-10 with 10 being normal): 10. IMPRESSION: 1. No evidence of acute large vascular territory infarct or acute hemorrhage.ASPECTS is 10 2. Moderate chronic microvascular ischemic disease. An MRI could provide more sensitive evaluation for acute white matter infarct clinically indicated. 3. 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 Code stroke imaging results were communicated on 02/23/2021 at 1:25 pm to provider Dr. Quinn Axe Via secure text paging. Electronically Signed   By: Margaretha Sheffield MD   On: 02/23/2021 13:29    Procedures Procedures   Medications Ordered in ED Medications  sodium chloride flush (NS) 0.9 % injection 3 mL (3 mLs Intravenous Not Given 02/23/21 1336)  LORazepam (ATIVAN) injection  0.25 mg (0.25 mg Intravenous Given 02/23/21 1552)    ED Course  I have reviewed the triage  vital signs and the nursing notes.  Pertinent labs & imaging results that were available during my care of the patient were reviewed by me and considered in my medical decision making (see chart for details). CRITICAL CARE Performed by: Milton Ferguson Total critical care time: 35 minutes Critical care time was exclusive of separately billable procedures and treating other patients. Critical care was necessary to treat or prevent imminent or life-threatening deterioration. Critical care was time spent personally by me on the following activities: development of treatment plan with patient and/or surrogate as well as nursing, discussions with consultants, evaluation of patient's response to treatment, examination of patient, obtaining history from patient or surrogate, ordering and performing treatments and interventions, ordering and review of laboratory studies, ordering and review of radiographic studies, pulse oximetry and re-evaluation of patient's condition.    MDM Rules/Calculators/A&P                           Patient with a TIA.  She will be admitted to medicine with neurology consult Final Clinical Impression(s) / ED Diagnoses Final diagnoses:  TIA (transient ischemic attack)    Rx / DC Orders ED Discharge Orders     None        Milton Ferguson, MD 02/25/21 1010    Milton Ferguson, MD 02/25/21 1012

## 2021-02-24 ENCOUNTER — Observation Stay (HOSPITAL_COMMUNITY): Payer: Medicare Other

## 2021-02-24 DIAGNOSIS — F419 Anxiety disorder, unspecified: Secondary | ICD-10-CM | POA: Diagnosis present

## 2021-02-24 DIAGNOSIS — Z20822 Contact with and (suspected) exposure to covid-19: Secondary | ICD-10-CM | POA: Diagnosis present

## 2021-02-24 DIAGNOSIS — K219 Gastro-esophageal reflux disease without esophagitis: Secondary | ICD-10-CM | POA: Diagnosis present

## 2021-02-24 DIAGNOSIS — Z7982 Long term (current) use of aspirin: Secondary | ICD-10-CM | POA: Diagnosis not present

## 2021-02-24 DIAGNOSIS — Z888 Allergy status to other drugs, medicaments and biological substances status: Secondary | ICD-10-CM | POA: Diagnosis not present

## 2021-02-24 DIAGNOSIS — I10 Essential (primary) hypertension: Secondary | ICD-10-CM | POA: Diagnosis present

## 2021-02-24 DIAGNOSIS — Z87891 Personal history of nicotine dependence: Secondary | ICD-10-CM | POA: Diagnosis not present

## 2021-02-24 DIAGNOSIS — Z79899 Other long term (current) drug therapy: Secondary | ICD-10-CM | POA: Diagnosis not present

## 2021-02-24 DIAGNOSIS — M810 Age-related osteoporosis without current pathological fracture: Secondary | ICD-10-CM | POA: Diagnosis present

## 2021-02-24 DIAGNOSIS — Z6832 Body mass index (BMI) 32.0-32.9, adult: Secondary | ICD-10-CM | POA: Diagnosis not present

## 2021-02-24 DIAGNOSIS — K760 Fatty (change of) liver, not elsewhere classified: Secondary | ICD-10-CM | POA: Diagnosis present

## 2021-02-24 DIAGNOSIS — R55 Syncope and collapse: Secondary | ICD-10-CM | POA: Diagnosis present

## 2021-02-24 DIAGNOSIS — I639 Cerebral infarction, unspecified: Secondary | ICD-10-CM | POA: Diagnosis not present

## 2021-02-24 DIAGNOSIS — R7989 Other specified abnormal findings of blood chemistry: Secondary | ICD-10-CM | POA: Diagnosis present

## 2021-02-24 DIAGNOSIS — Z87442 Personal history of urinary calculi: Secondary | ICD-10-CM | POA: Diagnosis not present

## 2021-02-24 DIAGNOSIS — G459 Transient cerebral ischemic attack, unspecified: Secondary | ICD-10-CM | POA: Diagnosis present

## 2021-02-24 DIAGNOSIS — E669 Obesity, unspecified: Secondary | ICD-10-CM | POA: Diagnosis present

## 2021-02-24 DIAGNOSIS — R29810 Facial weakness: Secondary | ICD-10-CM | POA: Diagnosis present

## 2021-02-24 DIAGNOSIS — I6389 Other cerebral infarction: Secondary | ICD-10-CM | POA: Diagnosis not present

## 2021-02-24 DIAGNOSIS — Z8673 Personal history of transient ischemic attack (TIA), and cerebral infarction without residual deficits: Secondary | ICD-10-CM | POA: Diagnosis not present

## 2021-02-24 DIAGNOSIS — R4701 Aphasia: Secondary | ICD-10-CM | POA: Diagnosis present

## 2021-02-24 DIAGNOSIS — E11319 Type 2 diabetes mellitus with unspecified diabetic retinopathy without macular edema: Secondary | ICD-10-CM | POA: Diagnosis present

## 2021-02-24 DIAGNOSIS — R531 Weakness: Secondary | ICD-10-CM | POA: Diagnosis present

## 2021-02-24 DIAGNOSIS — Z794 Long term (current) use of insulin: Secondary | ICD-10-CM | POA: Diagnosis not present

## 2021-02-24 DIAGNOSIS — Q211 Atrial septal defect: Secondary | ICD-10-CM | POA: Diagnosis not present

## 2021-02-24 DIAGNOSIS — Z7983 Long term (current) use of bisphosphonates: Secondary | ICD-10-CM | POA: Diagnosis not present

## 2021-02-24 LAB — CBC WITH DIFFERENTIAL/PLATELET
Abs Immature Granulocytes: 0.05 10*3/uL (ref 0.00–0.07)
Basophils Absolute: 0.1 10*3/uL (ref 0.0–0.1)
Basophils Relative: 1 %
Eosinophils Absolute: 0.6 10*3/uL — ABNORMAL HIGH (ref 0.0–0.5)
Eosinophils Relative: 8 %
HCT: 33.8 % — ABNORMAL LOW (ref 36.0–46.0)
Hemoglobin: 10.5 g/dL — ABNORMAL LOW (ref 12.0–15.0)
Immature Granulocytes: 1 %
Lymphocytes Relative: 32 %
Lymphs Abs: 2.6 10*3/uL (ref 0.7–4.0)
MCH: 27.5 pg (ref 26.0–34.0)
MCHC: 31.1 g/dL (ref 30.0–36.0)
MCV: 88.5 fL (ref 80.0–100.0)
Monocytes Absolute: 0.8 10*3/uL (ref 0.1–1.0)
Monocytes Relative: 10 %
Neutro Abs: 4 10*3/uL (ref 1.7–7.7)
Neutrophils Relative %: 48 %
Platelets: 232 10*3/uL (ref 150–400)
RBC: 3.82 MIL/uL — ABNORMAL LOW (ref 3.87–5.11)
RDW: 14.4 % (ref 11.5–15.5)
WBC: 8.2 10*3/uL (ref 4.0–10.5)
nRBC: 0 % (ref 0.0–0.2)

## 2021-02-24 LAB — BASIC METABOLIC PANEL
Anion gap: 6 (ref 5–15)
BUN: 22 mg/dL (ref 8–23)
CO2: 27 mmol/L (ref 22–32)
Calcium: 9.1 mg/dL (ref 8.9–10.3)
Chloride: 107 mmol/L (ref 98–111)
Creatinine, Ser: 1.58 mg/dL — ABNORMAL HIGH (ref 0.44–1.00)
GFR, Estimated: 36 mL/min — ABNORMAL LOW (ref >60)
Glucose, Bld: 201 mg/dL — ABNORMAL HIGH (ref 70–99)
Potassium: 4.1 mmol/L (ref 3.5–5.1)
Sodium: 140 mmol/L (ref 135–145)

## 2021-02-24 LAB — LIPID PANEL
Cholesterol: 107 mg/dL (ref 0–200)
HDL: 22 mg/dL — ABNORMAL LOW (ref >40)
LDL Cholesterol: 53 mg/dL (ref 0–99)
Total CHOL/HDL Ratio: 4.9 RATIO
Triglycerides: 158 mg/dL — ABNORMAL HIGH (ref <150)
VLDL: 32 mg/dL (ref 0–40)

## 2021-02-24 LAB — CBG MONITORING, ED: Glucose-Capillary: 288 mg/dL — ABNORMAL HIGH (ref 70–99)

## 2021-02-24 LAB — GLUCOSE, CAPILLARY
Glucose-Capillary: 357 mg/dL — ABNORMAL HIGH (ref 70–99)
Glucose-Capillary: 377 mg/dL — ABNORMAL HIGH (ref 70–99)
Glucose-Capillary: 320 mg/dL — ABNORMAL HIGH (ref 70–99)

## 2021-02-24 LAB — HIV ANTIBODY (ROUTINE TESTING W REFLEX): HIV Screen 4th Generation wRfx: NONREACTIVE

## 2021-02-24 LAB — HEMOGLOBIN A1C
Hgb A1c MFr Bld: 8 % — ABNORMAL HIGH (ref 4.8–5.6)
Mean Plasma Glucose: 182.9 mg/dL

## 2021-02-24 MED ORDER — INSULIN GLARGINE 100 UNIT/ML ~~LOC~~ SOLN
16.0000 [IU] | Freq: Every day | SUBCUTANEOUS | Status: DC
  Administered 2021-02-24: 16 [IU] via SUBCUTANEOUS
  Filled 2021-02-24 (×2): qty 0.16

## 2021-02-24 MED ORDER — ALPRAZOLAM 0.25 MG PO TABS
0.2500 mg | ORAL_TABLET | Freq: Every day | ORAL | Status: DC | PRN
  Administered 2021-02-24 – 2021-02-26 (×3): 0.25 mg via ORAL
  Filled 2021-02-24 (×3): qty 1

## 2021-02-24 NOTE — Progress Notes (Signed)
OT Note  Pt seen for evaluation. Full note to follow. Pt with minimally unsteady gait. Reports increased difficulty with memory and "keeping her train of thought", which has been worse since her SDH in April, although she states these issues have been "going on for awhile". Pt frequently forgets appointments, misses her medications at times and misplaces items around the house. Recommend pt follow up with OT at a neuro outpt center - pt prefers [place] location. Further work up of cognitive issues is also recommended.   Maurie Boettcher, OT/L   Acute OT Clinical Specialist Acute Rehabilitation Services Pager [phone] Office [phone]

## 2021-02-24 NOTE — TOC Initial Note (Signed)
Transition of Care [facility]) - Initial/Assessment Note    Patient Details  Name: Erin [name] MRN: [id number] Date of Birth: 12/17/1955  [facility] CM/SW Contact:    Pollie Friar, RN Phone Number: 02/24/2021, 3:46 PM  Clinical Narrative:                 Patient lives at home with her spouse and step father. 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 Spouse is not there 24/7.  She denies any DME at home. She denies issues with home meds or transportation. Recommendations for outpatient therapy. Pt is interested in attending at [facility] [place]. Information faxed to the rehab.  TOC following.  Expected Discharge Plan: OP Rehab Barriers to Discharge: Continued Medical Work up   Patient Goals and CMS Choice     Choice offered to / list presented to : Patient  Expected Discharge Plan and Services Expected Discharge Plan: OP Rehab   Discharge Planning Services: CM Consult   Living arrangements for the past 2 months: Single Family Home                                      Prior Living Arrangements/Services Living arrangements for the past 2 months: Single Family Home Lives with:: Spouse Patient language and need for interpreter reviewed:: Yes Do you feel safe going back to the place where you live?: Yes        Care giver support system in place?: No (comment)   Criminal Activity/Legal Involvement Pertinent to Current Situation/Hospitalization: No - Comment as needed  Activities of Daily Living      Permission Sought/Granted                  Emotional Assessment Appearance:: Appears stated age Attitude/Demeanor/Rapport: Engaged Affect (typically observed): Accepting Orientation: : Oriented to Self, Oriented to Place, Oriented to  Time, Oriented to Situation   Psych Involvement: No (comment)  Admission diagnosis:  TIA (transient ischemic attack) [G45.9] Transient ischemic attack [G45.9] Patient Active  Problem List   Diagnosis Date Noted   TIA (transient ischemic attack) 02/24/2021   Transient ischemic attack 02/23/2021   Right knee pain 02/22/2016   Encopresis 11/24/2015   Uncontrolled type 2 diabetes mellitus with complication, with long-term current use of insulin ([place]) 06/17/2015   Diabetic polyneuropathy associated with type 2 diabetes mellitus ([place]) 10/03/2014   Bilateral ankle pain 03/21/2014   Obesity (BMI 30-39.9) 10/31/2013   Atherosclerosis of left carotid artery 06/05/2013   FATTY LIVER DISEASE 06/15/2010   HYPERGLYCEMIA 04/26/2010   OTHER NONSPECIFIC ABNORMAL SERUM ENZYME LEVELS 04/26/2010   BACK PAIN WITH RADICULOPATHY 06/02/2009   HYPOXEMIA 06/02/2009   COLONIC POLYPS 04/23/2009   DIVERTICULOSIS OF COLON 04/23/2009   POSTMENOPAUSAL STATUS 02/13/2009   MICROALBUMINURIA 11/13/2007   GAD (generalized anxiety disorder) 03/14/2007   OBESITY NOS 12/26/2006   Diabetes mellitus type 2, uncontrolled, with complications ([place]) 35/00/9381   Dyslipidemia 05/16/2006   HYPERTENSION, BENIGN SYSTEMIC 05/16/2006   PCP:  [facility] [place]:   [place], [place] [address] [place] [address] Phone: [phone] Fax: [phone]     Social Determinants of Health (SDOH) Interventions    Readmission Risk Interventions No flowsheet data found.

## 2021-02-24 NOTE — Progress Notes (Signed)
    [facility] has been requested to perform a transesophageal echocardiogram on Erin [name] for stroke.  After careful review of history and examination, the risks and benefits of transesophageal echocardiogram have been explained including risks of esophageal damage, perforation (1:10,000 risk), bleeding, pharyngeal hematoma as well as other potential complications associated with conscious sedation including aspiration, arrhythmia, respiratory failure and death. Alternatives to treatment were discussed, questions were answered. Patient is willing to proceed.   Kathyrn Drown, NP  02/24/2021 4:10 PM

## 2021-02-24 NOTE — Evaluation (Signed)
Physical Therapy Evaluation Patient Details Name: Erin [name] MRN: [id number] DOB: Jun 13, 1956 Today's Date: 02/24/2021   History of Present Illness  Pt is a 65 y/o female admitted 7/19 secondary to bilateral LE weakness and L facial droop. MRI negative. PMH includes CVA, DM, tobacco use, renal cell cancer, and HTN.  Clinical Impression  Pt admitted secondary to problem above and deficits below. Pt requiring min to min guard A for transfers and gait this session. Noted increased instability with dynamic gait tasks and had LOB when performing horizontal and vertical head turns requiring min A for steadying. Educated about use of RW, but pt reports she does not want to use. Feel she would benefit from outpatient PT to address current balance deficits. Will continue to follow acutely.     Follow Up Recommendations Outpatient PT    Equipment Recommendations  None recommended by PT (Pt refusing RW)    Recommendations for Other Services       Precautions / Restrictions Precautions Precautions: Fall Restrictions Weight Bearing Restrictions: No      Mobility  Bed Mobility Overal bed mobility: Modified Independent                  Transfers Overall transfer level: Needs assistance Equipment used: None Transfers: Sit to/from Stand Sit to Stand: Min guard         General transfer comment: Min guard for safety and steadying. Unsteadiness with initial stand and had to return to sitting. Improved balance with second stand.  Ambulation/Gait Ambulation/Gait assistance: Min guard;Min assist Gait Distance (Feet): 150 Feet Assistive device: None Gait Pattern/deviations: Step-through pattern;Decreased stride length;Staggering right Gait velocity: Decreased   General Gait Details: Very slow, cautious gait. Increased unsteadiness noted with horizontal and vertical head turns. LOB noted with dynamic gait tasks and required min A for steadying. Educated about use of RW, but pt  reports she does not want to use.  Stairs            Wheelchair Mobility    Modified Rankin (Stroke Patients Only)       Balance Overall balance assessment: Needs assistance Sitting-balance support: No upper extremity supported;Feet supported Sitting balance-Leahy Scale: Good     Standing balance support: No upper extremity supported;During functional activity Standing balance-Leahy Scale: Fair                               Pertinent Vitals/Pain Pain Assessment: No/denies pain    Home Living Family/patient expects to be discharged to:: Private residence Living Arrangements: Other (Comment) (step father) Available Help at Discharge: Family Type of Home: House Home Access: Stairs to enter Entrance Stairs-Rails: None Technical brewer of Steps: 4 Home Layout: One level   Additional Comments: Moved in with her step father who has dementia to be his caretaker    Prior Function Level of Independence: Independent               Hand Dominance        Extremity/Trunk Assessment   Upper Extremity Assessment Upper Extremity Assessment: Defer to OT evaluation    Lower Extremity Assessment Lower Extremity Assessment: Generalized weakness (grossly 4/5 throughout BLE)    Cervical / Trunk Assessment Cervical / Trunk Assessment: Normal  Communication   Communication: No difficulties  Cognition Arousal/Alertness: Awake/alert Behavior During Therapy: WFL for tasks assessed/performed Overall Cognitive Status: No family/caregiver present to determine baseline cognitive functioning  General Comments: Somewhat slow to answer questions      General Comments      Exercises     Assessment/Plan    PT Assessment Patient needs continued PT services  PT Problem List Decreased balance;Decreased mobility;Decreased safety awareness;Decreased knowledge of precautions       PT Treatment Interventions  Gait training;Stair training;Functional mobility training;Therapeutic activities;Therapeutic exercise;Balance training;Patient/family education    PT Goals (Current goals can be found in the Care Plan section)  Acute Rehab PT Goals Patient Stated Goal: to go home PT Goal Formulation: With patient Time For Goal Achievement: 03/10/21 Potential to Achieve Goals: Good    Frequency Min 3X/week   Barriers to discharge        Co-evaluation               AM-PAC PT "6 Clicks" Mobility  Outcome Measure Help needed turning from your back to your side while in a flat bed without using bedrails?: None Help needed moving from lying on your back to sitting on the side of a flat bed without using bedrails?: None Help needed moving to and from a bed to a chair (including a wheelchair)?: A Little Help needed standing up from a chair using your arms (e.g., wheelchair or bedside chair)?: A Little Help needed to walk in hospital room?: A Little Help needed climbing 3-5 steps with a railing? : A Little 6 Click Score: 20    End of Session Equipment Utilized During Treatment: Gait belt Activity Tolerance: Patient tolerated treatment well Patient left: in bed;with call bell/phone within reach;with bed alarm set Nurse Communication: Mobility status PT Visit Diagnosis: Unsteadiness on feet (R26.81)    Time: 8875-7972 PT Time Calculation (min) (ACUTE ONLY): 14 min   Charges:   PT Evaluation $PT Eval Low Complexity: 1 Low          Lou Miner, DPT  Acute Rehabilitation Services  Pager: [phone] Office: [phone]   Erin [name] 02/24/2021, 9:20 AM

## 2021-02-24 NOTE — Progress Notes (Addendum)
STROKE TEAM PROGRESS NOTE    Interval History   No acute events overnight, patient informed about need for further stroke work up + seizure work up.  Patient had another brief transient episode of expressive speech difficulties earlier this morning which has resolved.  She denies any headaches accompanying her seizures or any focal extremity twitchings. She is amenable to TEE and ILR placement.  History reviewed in detail with the patient as well as in care everywhere and electronic medical records.  She initially presented to [facility] with episode of syncope and brief loss of consciousness and MRI on 12/12/2020 showed tiny punctate right temporal cortical infarct.  She had expressive aphasia and generalized weakness at that time.  She returned a week later to [facility] ER with transient episode of expressive aphasia and right-sided weakness and CT scan of the head was unremarkable.  Yesterday she states she was swimming pool where she developed sudden onset of bilateral upper extremity weakness and was unable to pull herself up and fell backwards.  She subsequently again noticed some transient expressive aphasia and word finding difficulties.  MRI scan during the current admission shows no definite evidence of stroke.  Patient denies any headache accompanying any of these episodes.  She does admit to some jerking of the right upper extremity during the initial episode.  She has no prior history of seizures ,strokes or TIAs. She does admit to significant underlying psychosocial stressors which she thinks may have brought on these episodes Pertinent Lab Work and Imaging    02/23/21 CT Head WO IV Contrast  02/23/21 MR Angio Head and Neck  Motion degraded head and neck MRAs.  No large vessel occlusion.  02/23/21 MRI Brain WO IV Contrast NAICP  02/23/21 Echocardiogram Complete   1. Left ventricular ejection fraction, by estimation, is 55 to 60%. The left ventricle has normal function. The left  ventricle has no regional wall motion abnormalities. Left ventricular diastolic parameters are consistent with Grade I diastolic dysfunction (impaired relaxation).   2. Right ventricular systolic function is normal. The right ventricular size is normal. Tricuspid regurgitation signal is inadequate for assessing PA pressure.   3. The mitral valve is normal in structure. No evidence of mitral valve regurgitation. No evidence of mitral stenosis.   4. The aortic valve is normal in structure. Aortic valve regurgitation is not visualized. No aortic stenosis is present.   5. The inferior vena cava is normal in size with greater than 50% respiratory variability, suggesting right atrial pressure of 3 mmHg.   Physical Examination   Constitutional: Pleasant middle-aged Caucasian lady not in distress.  Appears anxious Cardiovascular: Normal RR Respiratory: No increased WOB   Mental status: AAOx4, following commands Speech: Fluent with intact repetition and naming  Cranial nerves: EOMI, VFF, Subtle Left Facial Droop, Tongue midline  Motor: Normal bulk and tone. No drift. RUE + RLE 5/5 LUE 5/5 LLE 4/5 hip flexor 5/5 knee extension/flexion, 5/5 dorsi/plantar flexion  Sensory: Intact to light touch throughout  Coordination: Intact FNF  Gait: Deferred   NIHSS: 1  Assessment and Plan   [name] is a 65 y.o. 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 Observers noted a left facial droop and patient noted difficulty getting her words out- sx resolved upon arriving to the hospital.  She was not a candidate for IVTPA given sx resolution, not a candidate for thrombectomy given lack of cortical signs on exam indicating LVO at the time.   #BLE Weakness #Left Facial Droop #Prior h/o silent right  temporal.Stroke and  left hemisphericTIA  Patient presented with the symptoms described above. At this time, stroke work up is ongoing. In the month of May, patient had two other admissions- 5/6 she presented to [facility] ED with word finding difficulties- MRI Brain showed a right posterior temporal parietal stroke. Started on DAPT. On 5/12 she presented once again with word finding difficulty and slurred speech- stroke work up was negative at the time and her DAPT was continued. It was felt that she had a TIA vs more of a stress response.   This admission she presents with BLE weakness and neurological exam is pertinent for subtle left facial droop + slight left hip flexor weakness. FD is new per patient and not documented in her outpatient neuro follow up note on 12/21/20.  Will proceed with further stroke work up including TEE and ILR given her prior presentations and now this presentation with no stroke etiology identified thus far. So far this admission, MRI Brain is negative for acute intracranial pathology, MRA Head and Neck is unremarkable, Echo w/EF 55 to 60 %, stroke labs w/LDL 53 and hemoglobin a1C 8.0.  - Continue DAPT + Atorvastatin 80 mg for stroke prevention  - Cardiology consulted for TEE, state they will schedule her for tomorrow 02/25/21, asked to place ILR if TEE findings are normal  - NPO at midnight for TEE  - Amb referral to neurology placed for stroke follow up   #Permissive Hypertension No history of hypertension and pressures are trending normotensive. Recommend permissive hypertension for the first 24-48 hours then gently lower blood pressure avoiding acute drops.  - Long term blood pressure goal is < 140/90.  #Hyperlipidemia From a stroke prevention stand point, the LDL goal is < 70. LDL is 53, continue home Atorvastatin 80 mg.   #Stroke Dysphagia Screening Passed bedside swallow evaluation for regular diet, carb controlled.   #DMII  Hemoglobin A1C this admission noted to be  8.0. Recommend treating DMII with SSI while inpatient. PCP to manage on an outpatient basis.   Hospital day # 0  [name], NP  [facility] Nurse Practitioner Patient seen and discussed with attending physician Dr. Leonie Man   Stroke MD note: I have personally obtained history,examined this patient, reviewed notes, independently viewed imaging studies, participated in medical decision making and plan of care.ROS completed by me personally and pertinent positives fully documented  I have made any additions or clarifications directly to the above note. Agree with note above.  Patient has presented with episodes of recurrent transient expressive aphasia and right-sided weakness.  Neurological exam at present seems quite nonfocal.  MRI scan this time shows no definite left hemispheric infarct.  She had somewhat similar admission in May 2022 at [facility] and MRI had shown a silent tiny right temporal infarct.  Recommend continue ongoing evaluation by checking EEG for seizure activity and TEE for cardiac source of embolism and likely loop recorder at discharge.  She likely has underlying psychosocial stressors which could contribute but given the fact that MRI did show her right temporal infarct in May 22 continuing work-up for source of embolism may be necessary.  Long discussion with patient and answered questions.  Greater than 50% time during this 35-minute visit was spent in counseling  and coordination of care about TIAs and embolic stroke and answering questions. Antony Contras, MD Medical Director [facility] Pager: [phone] 02/24/2021 5:27 PM   To contact Stroke Continuity provider, please refer to [email or web address] After hours, contact General Neurology

## 2021-02-24 NOTE — TOC CAGE-AID Note (Signed)
Transition of Care [facility]) - CAGE-AID Screening   Patient Details  Name: Erin [name] MRN: [id number] Date of Birth: 04/30/1956  Transition of Care [facility]) CM/SW Contact:    [name], [place] Phone Number: 02/24/2021, 12:55 PM   Clinical Narrative: Pt participated in Christmas.  Pt stated she does not use substance or ETOH.Pt was not offered resources.  Pt stated they did not feel that they were in need of resources at this time.   [name], MSW, LCSW-A Pronouns:  She/Her/Hers [facility]itions of Care Clinical Social Worker Direct Number:  [phone] [name]   CAGE-AID Screening:    Have You Ever Felt You Ought to Cut Down on Your Drinking or Drug Use?: No Have People Annoyed You By [employer] Your Drinking Or Drug Use?: No Have You Felt Bad Or Guilty About Your Drinking Or Drug Use?: No Have You Ever Had a Drink or Used Drugs First Thing In The Morning to Steady Your Nerves or to Get Rid of a Hangover?: No CAGE-AID Score: 0  Substance Abuse Education Offered: No

## 2021-02-24 NOTE — ED Notes (Signed)
No neuro deficits noted. Patient is eating breakfast without difficulty or s/s aspiration. Report given to floor. Patient transported by float RN.

## 2021-02-24 NOTE — H&P (View-Only) (Signed)
STROKE TEAM PROGRESS NOTE    Interval History   No acute events overnight, patient informed about need for further stroke work up + seizure work up.  Patient had another brief transient episode of expressive speech difficulties earlier this morning which has resolved.  She denies any headaches accompanying her seizures or any focal extremity twitchings. She is amenable to TEE and ILR placement.  History reviewed in detail with the patient as well as in care everywhere and electronic medical records.  She initially presented to [facility] with episode of syncope and brief loss of consciousness and MRI on 12/12/2020 showed tiny punctate right temporal cortical infarct.  She had expressive aphasia and generalized weakness at that time.  She returned a week later to [facility] ER with transient episode of expressive aphasia and right-sided weakness and CT scan of the head was unremarkable.  Yesterday she states she was swimming pool where she developed sudden onset of bilateral upper extremity weakness and was unable to pull herself up and fell backwards.  She subsequently again noticed some transient expressive aphasia and word finding difficulties.  MRI scan during the current admission shows no definite evidence of stroke.  Patient denies any headache accompanying any of these episodes.  She does admit to some jerking of the right upper extremity during the initial episode.  She has no prior history of seizures ,strokes or TIAs. She does admit to significant underlying psychosocial stressors which she thinks may have brought on these episodes Pertinent Lab Work and Imaging    02/23/21 CT Head WO IV Contrast  02/23/21 MR Angio Head and Neck  Motion degraded head and neck MRAs.  No large vessel occlusion.  02/23/21 MRI Brain WO IV Contrast NAICP  02/23/21 Echocardiogram Complete   1. Left ventricular ejection fraction, by estimation, is 55 to 60%. The left ventricle has normal function. The left  ventricle has no regional wall motion abnormalities. Left ventricular diastolic parameters are consistent with Grade I diastolic dysfunction (impaired relaxation).   2. Right ventricular systolic function is normal. The right ventricular size is normal. Tricuspid regurgitation signal is inadequate for assessing PA pressure.   3. The mitral valve is normal in structure. No evidence of mitral valve regurgitation. No evidence of mitral stenosis.   4. The aortic valve is normal in structure. Aortic valve regurgitation is not visualized. No aortic stenosis is present.   5. The inferior vena cava is normal in size with greater than 50% respiratory variability, suggesting right atrial pressure of 3 mmHg.   Physical Examination   Constitutional: Pleasant middle-aged Caucasian lady not in distress.  Appears anxious Cardiovascular: Normal RR Respiratory: No increased WOB   Mental status: AAOx4, following commands Speech: Fluent with intact repetition and naming  Cranial nerves: EOMI, VFF, Subtle Left Facial Droop, Tongue midline  Motor: Normal bulk and tone. No drift. RUE + RLE 5/5 LUE 5/5 LLE 4/5 hip flexor 5/5 knee extension/flexion, 5/5 dorsi/plantar flexion  Sensory: Intact to light touch throughout  Coordination: Intact FNF  Gait: Deferred   NIHSS: 1  Assessment and Plan   [name] is a 65 y.o. 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 Observers noted a left facial droop and patient noted difficulty getting her words out- sx resolved upon arriving to the hospital.  She was not a candidate for IVTPA given sx resolution, not a candidate for thrombectomy given lack of cortical signs on exam indicating LVO at the time.   #BLE Weakness #Left Facial Droop #Prior h/o silent right  temporal.Stroke and  left hemisphericTIA  Patient presented with the symptoms described above. At this time, stroke work up is ongoing. In the month of May, patient had two other admissions- 5/6 she presented to [facility] ED with word finding difficulties- MRI Brain showed a right posterior temporal parietal stroke. Started on DAPT. On 5/12 she presented once again with word finding difficulty and slurred speech- stroke work up was negative at the time and her DAPT was continued. It was felt that she had a TIA vs more of a stress response.   This admission she presents with BLE weakness and neurological exam is pertinent for subtle left facial droop + slight left hip flexor weakness. FD is new per patient and not documented in her outpatient neuro follow up note on 12/21/20.  Will proceed with further stroke work up including TEE and ILR given her prior presentations and now this presentation with no stroke etiology identified thus far. So far this admission, MRI Brain is negative for acute intracranial pathology, MRA Head and Neck is unremarkable, Echo w/EF 55 to 60 %, stroke labs w/LDL 53 and hemoglobin a1C 8.0.  - Continue DAPT + Atorvastatin 80 mg for stroke prevention  - Cardiology consulted for TEE, state they will schedule her for tomorrow 02/25/21, asked to place ILR if TEE findings are normal  - NPO at midnight for TEE  - Amb referral to neurology placed for stroke follow up   #Permissive Hypertension No history of hypertension and pressures are trending normotensive. Recommend permissive hypertension for the first 24-48 hours then gently lower blood pressure avoiding acute drops.  - Long term blood pressure goal is < 140/90.  #Hyperlipidemia From a stroke prevention stand point, the LDL goal is < 70. LDL is 53, continue home Atorvastatin 80 mg.   #Stroke Dysphagia Screening Passed bedside swallow evaluation for regular diet, carb controlled.   #DMII  Hemoglobin A1C this admission noted to be  8.0. Recommend treating DMII with SSI while inpatient. PCP to manage on an outpatient basis.   Hospital day # 0  Erin Hinds, NP  [facility] Nurse Practitioner Patient seen and discussed with attending physician Dr. Leonie Man   Stroke MD note: I have personally obtained history,examined this patient, reviewed notes, independently viewed imaging studies, participated in medical decision making and plan of care.ROS completed by me personally and pertinent positives fully documented  I have made any additions or clarifications directly to the above note. Agree with note above.  Patient has presented with episodes of recurrent transient expressive aphasia and right-sided weakness.  Neurological exam at present seems quite nonfocal.  MRI scan this time shows no definite left hemispheric infarct.  She had somewhat similar admission in May 2022 at [facility] and MRI had shown a silent tiny right temporal infarct.  Recommend continue ongoing evaluation by checking EEG for seizure activity and TEE for cardiac source of embolism and likely loop recorder at discharge.  She likely has underlying psychosocial stressors which could contribute but given the fact that MRI did show her right temporal infarct in May 22 continuing work-up for source of embolism may be necessary.  Long discussion with patient and answered questions.  Greater than 50% time during this 35-minute visit was spent in counseling  and coordination of care about TIAs and embolic stroke and answering questions. Erin Contras, MD Medical Director [facility] Pager: [phone] 02/24/2021 5:27 PM   To contact Stroke Continuity provider, please refer to [email or web address] After hours, contact General Neurology

## 2021-02-24 NOTE — Progress Notes (Signed)
OT Cancellation Note  Patient Details Name: Erin [name] MRN: [id number] DOB: [birth year]   Cancelled Treatment:    Reason Eval/Treat Not Completed: Patient at procedure or test/ unavailable (will return later time)  [place], OT/L   Acute OT Clinical Specialist [place] Pager [phone] Office [phone]  02/24/2021, 2:08 PM

## 2021-02-24 NOTE — Progress Notes (Signed)
Routine EEG. Results pending

## 2021-02-24 NOTE — Progress Notes (Signed)
Subjective: The patient was seen at bedside during rounds today. She denies numbness and weakness but endorses some word-finding difficulty. She does state that this has happened to her before with her prior strokes. She otherwise has no other complaints or concerns.  Objective:  Vital signs in last 24 hours: Vitals:   02/24/21 0700 02/24/21 0715 02/24/21 0803 02/24/21 0822  BP: (!) 141/91   139/90  Pulse: 88 84 92 87  Resp:    18  Temp:    98.2 F (36.8 C)  TempSrc:      SpO2: 96% 98% 95% 98%  Weight:      Height:        General: NAD, nl appearance HE: Normocephalic, atraumatic , EOMI, Conjunctivae normal ENT: No congestion, no rhinorrhea, no exudate or erythema  Cardiovascular: Normal rate, regular rhythm.  No murmurs, rubs, or gallops Pulmonary : Effort normal, breath sounds normal. No wheezes, rales, or rhonchi Abdominal: soft, nontender,  bowel sounds present Musculoskeletal: no swelling, deformity, injury, or tenderness in extremities, Skin: Warm, dry, no bruising, erythema, or rash  Neurologic exam: Mental status: A&Ox3. Able to name correctly name objects such as a watch, phone, and stethoscope Cranial Nerves:             II: PERRL             III, IV, VI: Extra-occular motions intact bilaterally             V, VII: Face symmetric, sensation intact in all 3 divisions               VIII: Hearing is grossly intact              IX, X: Palate rises symmetrically             XII: Tongue midline    Motor: Strength 5/5 on all upper and lower extremities, bulk muscle and tone are normal  Sensory: Light touch intact and symmetric bilaterally  Coordination: There is no dysmetria on finger-to-nose. Rapid alternating movement test normal. Heel to shin test normal. Psychiatric: Normal mood and affect    Assessment/Plan:  Active Problems:   Transient ischemic attack  #TIA #Word-finding difficulty, new onset during this admission Patient presented with left-sided  weakness and facial droop. She has multiple stroke risk factors and recently had a stroke in May 2022. CT head was negative for acute intracranial abnormality. MRA head/neck showed no large vessel occlusion. MRI showed chronic small vessel ischemic disease but no acute intracranial abnormality. TTE with unrevealing findings; EF was 55-60%.  The patient now endorses some word-finding difficulty. She states that she has had this happen to her before with prior strokes, but this is a new change from her evaluation yesterday. We have informed neuro of this change, and they performed an EEG which was WNL. Discussed with Neuro over Epic chat their high suspicion that there is an embolic source given her prior presentations with stroke symptoms. They have consulted Cardiology who recommends a TEE tomorrow.  - Neurology is on board; greatly appreciate their recs - NPO at midnight for TEE tomorrow - Continue Plavix 75 mg and Aspirin 81 mg daily for 3 weeks followed with aspirin alone - Continue Lipitor 80 mg - PT/OT -Frequent neurochecks   Elevated creatinine Creatinine on admission 1.86, BUN 25. Baseline creatinine unclear, last admission in May showed a creatinine of 1.86. GFR was decreased in the 20s to 30s during her last hospitalization. Improving, now at 1.58.  Will continue to follow.  -Trend BMP -Avoid nephrotoxic agents   Hypertension Home medications include atenolol 50 mg and hydralazine 25 mg 3 times daily. BP is well-controlled at this time. Will continue to hold these medications.   DM Per chart review multiple different diabetic medications listed, will need to confirm with patient her diabetic regimen.  Medications include Trulicity 3 mg weekly, NovoLog 15 units daily with meals and insulin 80 units twice daily with meals. -SSI - Added Lantus 16 units today, daily at bedtime -Confirm diabetes regimen with patient tomorrow   GERD - Continue protonix, 40 mg daily  Prior to Admission  Living Arrangement: Home Anticipated Discharge Location: Home Barriers to Discharge: Stroke workup Dispo: Anticipated discharge in approximately 1-2 day(s).   Orvis Brill, MD 02/24/2021, 10:19 AM Pager: [phone] After 5pm on weekdays and 1pm on weekends: On Call pager [phone]

## 2021-02-24 NOTE — Procedures (Signed)
Patient Name: Erin [name]  MRN: [id number]  Epilepsy Attending: Lora Havens  Referring Physician/Provider: [name], NP Date: 02/24/2021 Duration: 24.38 mins  Patient history: 65 year old female with transient weakness, left-sided facial droop and speech disturbance.  EEG to evaluate for seizures.  Level of alertness: Awake  AEDs during EEG study: None  Technical aspects: This EEG study was done with scalp electrodes positioned according to the 10-20 International system of electrode placement. Electrical activity was acquired at a sampling rate of 500Hz  and reviewed with a high frequency filter of 70Hz  and a low frequency filter of 1Hz . EEG data were recorded continuously and digitally stored.   Description: The posterior dominant rhythm consists of 9 Hz activity of moderate voltage (25-35 uV) seen predominantly in posterior head regions, symmetric and reactive to eye opening and eye closing. Hyperventilation and photic stimulation were not performed.     IMPRESSION: This study is within normal limits. No seizures or epileptiform discharges were seen throughout the recording.  [name]

## 2021-02-24 NOTE — Progress Notes (Signed)
  Date: 02/24/2021  Patient name: Erin [name]  Medical record number: [id number]  Date of birth: Apr 13, 1956   I have seen and evaluated Erin [name] and discussed their care with the Residency Team. Briefly, Erin [name] presented with left facial droop and word finding difficulties. She was brought in and initiated on stroke/TIA work up with neurology following, imaging and blood work.  This morning she noted that her word finding difficulty had returned.  She was very tearful on exam.    PMHx, Fam Hx, and/or Soc Hx : She is not aware of her family history.  She is a never smoker  Vitals:   02/24/21 0822 02/24/21 1207  BP: 139/90 135/83  Pulse: 87 89  Resp: 18 18  Temp: 98.2 F (36.8 C) 98.3 F (36.8 C)  SpO2: 98% 95%   Gen: Lying in bed, tearful Eyes: Anicteric sclerae, no injection HENT: Large neck, unable to assess JVD, supple CV: RR, NR, no murmur, no extra sounds Pulm: Breathing comfortably, no wheezing Abd: Obese, NT, ND, +BS MSK: Normal tone and bulk Neuro: EOMI, CN grossly intact, naming intact, she has a bit of a lisp, unknown if this is chronic.  Her word finding when speaking spontaneously is slowed.  She has 5/5 strength throughout.  Finger to now and heel to shin were intact.  Psych: Tearful, depressed mood.   Assessment and Plan: I have seen and evaluated the patient as outlined above. I agree with the formulated Assessment and Plan as detailed in the residents' note, with the following changes:   1. TIA - Imaging has not shown an acute stroke, however, her symptoms have now recurred - Discussed with Neurology and will obtain EEG - Consider repeat head imaging - Follow up LDL and A1C - Follow up EEG - Follow neurology recommendations - PT/OT - Telemetry - Plavix/Aspirin for 3 weeks then aspirin alone - Continue statin - TTE pending - Permissive HTN  She has other chronic issues which we will monitor including elevated Cr (no diagnosis of CKD As of  yet), HTN, DM.  Her home medications will be continued and we will confirm her insulin dosing today.    Sid Falcon, MD 7/20/20221:52 PM

## 2021-02-25 ENCOUNTER — Inpatient Hospital Stay (HOSPITAL_COMMUNITY): Payer: Medicare Other

## 2021-02-25 ENCOUNTER — Inpatient Hospital Stay (HOSPITAL_COMMUNITY): Payer: Medicare Other | Admitting: Anesthesiology

## 2021-02-25 ENCOUNTER — Encounter (HOSPITAL_COMMUNITY)
Admission: EM | Disposition: A | Discharge status: Home / Self Care | Payer: Self-pay | Source: Home / Self Care | Attending: Internal Medicine

## 2021-02-25 DIAGNOSIS — I6389 Other cerebral infarction: Secondary | ICD-10-CM

## 2021-02-25 DIAGNOSIS — I639 Cerebral infarction, unspecified: Secondary | ICD-10-CM

## 2021-02-25 DIAGNOSIS — G459 Transient cerebral ischemic attack, unspecified: Secondary | ICD-10-CM | POA: Diagnosis not present

## 2021-02-25 DIAGNOSIS — Q211 Atrial septal defect: Secondary | ICD-10-CM

## 2021-02-25 HISTORY — PX: LOOP RECORDER INSERTION: EP1214

## 2021-02-25 HISTORY — PX: BUBBLE STUDY: SHX6837

## 2021-02-25 HISTORY — PX: TEE WITHOUT CARDIOVERSION: SHX5443

## 2021-02-25 LAB — GLUCOSE, CAPILLARY
Glucose-Capillary: 279 mg/dL — ABNORMAL HIGH (ref 70–99)
Glucose-Capillary: 219 mg/dL — ABNORMAL HIGH (ref 70–99)
Glucose-Capillary: 288 mg/dL — ABNORMAL HIGH (ref 70–99)
Glucose-Capillary: 309 mg/dL — ABNORMAL HIGH (ref 70–99)

## 2021-02-25 LAB — BASIC METABOLIC PANEL
Anion gap: 8 (ref 5–15)
BUN: 22 mg/dL (ref 8–23)
CO2: 26 mmol/L (ref 22–32)
Calcium: 9.1 mg/dL (ref 8.9–10.3)
Chloride: 104 mmol/L (ref 98–111)
Creatinine, Ser: 1.57 mg/dL — ABNORMAL HIGH (ref 0.44–1.00)
GFR, Estimated: 36 mL/min — ABNORMAL LOW (ref >60)
Glucose, Bld: 285 mg/dL — ABNORMAL HIGH (ref 70–99)
Potassium: 4.3 mmol/L (ref 3.5–5.1)
Sodium: 138 mmol/L (ref 135–145)

## 2021-02-25 SURGERY — ECHOCARDIOGRAM, TRANSESOPHAGEAL
Anesthesia: Monitor Anesthesia Care

## 2021-02-25 SURGERY — LOOP RECORDER INSERTION

## 2021-02-25 MED ORDER — TICAGRELOR 90 MG PO TABS
90.0000 mg | ORAL_TABLET | Freq: Two times a day (BID) | ORAL | Status: DC
  Administered 2021-02-26: 90 mg via ORAL
  Filled 2021-02-25: qty 1

## 2021-02-25 MED ORDER — INSULIN GLARGINE 100 UNIT/ML ~~LOC~~ SOLN: 20.0000 [IU] | Freq: Every day | SUBCUTANEOUS | Status: DC

## 2021-02-25 MED ORDER — SODIUM CHLORIDE 0.9 % IV SOLN
INTRAVENOUS | Status: DC | PRN
Start: 2021-02-25 — End: 2021-02-25

## 2021-02-25 MED ORDER — PROPOFOL 10 MG/ML IV BOLUS
INTRAVENOUS | Status: DC | PRN
  Administered 2021-02-25 (×2): 20 mg via INTRAVENOUS
  Administered 2021-02-25: 30 mg via INTRAVENOUS

## 2021-02-25 MED ORDER — LIDOCAINE-EPINEPHRINE 1 %-1:100000 IJ SOLN
INTRAMUSCULAR | Status: DC | PRN
Start: 2021-02-25 — End: 2021-02-25
  Administered 2021-02-25: 30 mL

## 2021-02-25 MED ORDER — PREGABALIN 100 MG PO CAPS
200.0000 mg | ORAL_CAPSULE | Freq: Three times a day (TID) | ORAL | Status: DC
  Administered 2021-02-25 – 2021-02-26 (×3): 200 mg via ORAL
  Filled 2021-02-25 (×3): qty 2

## 2021-02-25 MED ORDER — PROPOFOL 500 MG/50ML IV EMUL
INTRAVENOUS | Status: DC | PRN
  Administered 2021-02-25: 125 ug/kg/min via INTRAVENOUS

## 2021-02-25 MED ORDER — INSULIN ASPART 100 UNIT/ML IJ SOLN
0.0000 [IU] | Freq: Three times a day (TID) | INTRAMUSCULAR | Status: DC
Start: 2021-02-25 — End: 2021-02-26
  Administered 2021-02-25 – 2021-02-26 (×2): 11 [IU] via SUBCUTANEOUS
  Administered 2021-02-26: 20 [IU] via SUBCUTANEOUS

## 2021-02-25 MED ORDER — INSULIN GLARGINE 100 UNIT/ML ~~LOC~~ SOLN
30.0000 [IU] | Freq: Every day | SUBCUTANEOUS | Status: DC
  Administered 2021-02-25: 30 [IU] via SUBCUTANEOUS
  Filled 2021-02-25 (×2): qty 0.3

## 2021-02-25 MED ORDER — LIDOCAINE 2% (20 MG/ML) 5 ML SYRINGE
INTRAMUSCULAR | Status: DC | PRN
  Administered 2021-02-25: 100 mg via INTRAVENOUS

## 2021-02-25 MED ORDER — LIDOCAINE-EPINEPHRINE 1 %-1:100000 IJ SOLN
INTRAMUSCULAR | Status: AC
  Filled 2021-02-25: qty 1

## 2021-02-25 MED ORDER — BUTAMBEN-TETRACAINE-BENZOCAINE 2-2-14 % EX AERO
INHALATION_SPRAY | CUTANEOUS | Status: DC | PRN
  Administered 2021-02-25: 2 via TOPICAL

## 2021-02-25 SURGICAL SUPPLY — 2 items
MONITOR MOBILE MNGR LINQ22 (Prosthesis & Implant Heart) ×1 IMPLANT
PACK LOOP INSERTION (CUSTOM PROCEDURE TRAY) ×2 IMPLANT

## 2021-02-25 NOTE — Transfer of Care (Signed)
Immediate Anesthesia Transfer of Care Note  Patient: Erin [name]  Procedure(s) Performed: TRANSESOPHAGEAL ECHOCARDIOGRAM (TEE) BUBBLE STUDY  Patient Location: Endoscopy Unit  Anesthesia Type:MAC  Level of Consciousness: awake, alert  and oriented  Airway & Oxygen Therapy: Patient Spontanous Breathing and Patient connected to nasal cannula oxygen  Post-op Assessment: Report given to RN and Post -op Vital signs reviewed and stable  Post vital signs: Reviewed and stable  Last Vitals:  Vitals Value Taken Time  BP 98/64   Temp    Pulse 88   Resp 14   SpO2 99%     Last Pain:  Vitals:   02/25/21 0857  TempSrc: Oral  PainSc: 0-No pain         Complications: No notable events documented.

## 2021-02-25 NOTE — Progress Notes (Signed)
Occupational Therapy Evaluation - late entry  Pt reports difficulty with memory. Recommend follow up with OT at a neruo outpt center.Pt prefers [place] location. All further OT to be addressed in the outpt setting.    02/24/21 1500  OT Visit Information  Last OT Received On 02/25/21  Assistance Needed +1  History of Present Illness Pt is a 65 y/o female admitted 7/19 secondary to bilateral LE weakness and L facial droop. MRI negative. PMH includes CVA, DM, tobacco use, renal cell cancer, and HTN.  Precautions  Precautions Fall  Home Living  Family/patient expects to be discharged to: Private residence  Living Arrangements Other (Comment)  Available Help at Discharge Family  Type of [place] to enter  Entrance Stairs-Number of Steps 4  Entrance Stairs-Rails None  Home Layout One level  Bathroom Biomedical scientist Yes  How Accessible Accessible via walker  [place] seat  Prior Function  Level of Independence Independent  Comments helps care for her father - in -law  Communication  Communication Other (comment) (difficulty "finding the right words" at times)  Pain Assessment  Pain Assessment No/denies pain  Cognition  Arousal/Alertness Awake/alert  Behavior During Therapy WFL for tasks assessed/performed  Overall Cognitive Status No family/caregiver present to determine baseline cognitive functioning  General Comments endoreses difficulty "keeping her train of thought"; "I have a hard time with my memory, especially lately". Has missed medicaitons, appointments, frequently misplaces items around house; currently driving  Upper Extremity Assessment  Upper Extremity Assessment Overall WFL for tasks assessed  Lower Extremity Assessment  Lower Extremity Assessment Defer to PT evaluation  ADL  Overall ADL's  Needs assistance/impaired  Functional mobility during ADLs Supervision/safety   General ADL Comments able to complete basic ADL tasks; educated on strateiges to reduce risk of falls; educated on using alarms on phone as external cue to memory deficits  Vision- History  Baseline Vision/History No visual deficits  Bed Mobility  Overal bed mobility Modified Independent  Transfers  Overall transfer level Needs assistance  Equipment used None  Transfers Sit to/from Stand  Sit to Stand Supervision  Balance  Overall balance assessment Needs assistance  Sitting balance-Leahy Scale Good  Standing balance-Leahy Scale Fair  OT - End of Session  Activity Tolerance Patient tolerated treatment well  Patient left in bed;with call bell/phone within reach  Nurse Communication Mobility status;Other (comment) (DC needs)  OT Assessment  OT Recommendation/Assessment All further OT needs can be met in the next venue of care  OT Visit Diagnosis Unsteadiness on feet (R26.81);Other symptoms and signs involving cognitive function  OT Problem List Impaired balance (sitting and/or standing);Decreased cognition;Decreased safety awareness;Decreased knowledge of use of DME or AE  AM-PAC OT "6 Clicks" Daily Activity Outcome Measure (Version 2)  Help from another person eating meals? 4  Help from another person taking care of personal grooming? 4  Help from another person toileting, which includes using toliet, bedpan, or urinal? 4  Help from another person bathing (including washing, rinsing, drying)? 4  Help from another person to put on and taking off regular upper body clothing? 4  Help from another person to put on and taking off regular lower body clothing? 4  6 Click Score 24  Progressive Mobility  What is the highest level of mobility based on the progressive mobility assessment? Level 6 (Walks independently in room and hall) - Balance while walking in room without assist - Complete  OT Recommendation  Follow Up Recommendations Outpatient OT (neuro outpt in [place])  OT  Equipment Tub/shower seat  Individuals Consulted  Consulted and Agree with Results and Recommendations Patient  Acute Rehab OT Goals  Patient Stated Goal to go home  OT Goal Formulation All assessment and education complete, DC therapy  OT Time Calculation  OT Start Time (ACUTE ONLY) 1429  OT Stop Time (ACUTE ONLY) 1450  OT Time Calculation (min) 21 min  OT General Charges  $OT Visit 1 Visit  Written Expression  Dominant Hand Right  Maurie Boettcher, OT/L   Acute OT Clinical Specialist [place] Pager [phone] Office [phone]

## 2021-02-25 NOTE — Consult Note (Signed)
ELECTROPHYSIOLOGY CONSULT NOTE  Patient ID: Erin [name] MRN: [id number], DOB/AGE: February 20, 1956   Admit date: 02/23/2021 Date of Consult: 02/25/2021  Primary Physician: [facility] [place] Primary Cardiologist: None  Primary Electrophysiologist: New to Dr. Quentin Ore Reason for Consultation: Cryptogenic stroke; recommendations regarding Implantable Loop Recorder Insurance: Medicare  History of Present Illness EP has been asked to evaluate Erin [name] for placement of an implantable loop recorder to monitor for atrial fibrillation by Dr Leonie Man.  The patient was admitted on 02/23/2021 with sudden onset upper extremity weakness with transient expressive aphasia.   Of note, she initially presented to [facility] with episode of syncope and brief loss of consciousness and MRI on 12/12/2020 showed tiny punctate right temporal cortical infarct.  She had expressive aphasia and generalized weakness at that time.  She returned a week later to [facility] ER with transient episode of expressive aphasia and right-sided weakness and CT scan of the head was unremarkable  She has undergone workup for stroke including:  02/23/21 CT Head WO IV Contrast  1. No evidence of acute large vascular territory infarct or acute hemorrhage.ASPECTS is 10 2. Moderate chronic microvascular ischemic disease. An MRI could provide more sensitive evaluation for acute white matter infarct clinically indicated. 3. 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 02/23/21 MR Angio Head and Neck  Motion degraded head and neck MRAs.  No large vessel occlusion. 02/23/21 MRI Brain WO IV Contrast - NAICP 02/23/21 Echocardiogram Complete - LVEF 55-60 %, Grade 1 DD   The patient has been monitored on telemetry which has demonstrated sinus rhythm with no arrhythmias.  Inpatient stroke work-up will require a TEE per Neurology.    Echocardiogram as above. Lab work is reviewed.  Prior to admission, the patient denies chest pain, shortness of breath, dizziness, palpitations, or syncope.  She is recovering from her stroke with plans to return home  at discharge.  Past Medical History:  Diagnosis Date   Anxiety    Diabetes mellitus    Fatty liver    Hypertension    Kidney stones    Obesity      Surgical History:  Past Surgical History:  Procedure Laterality Date   ABDOMINAL HYSTERECTOMY     complete with BSO for dysplasia   LITHOTRIPSY  08-2008   [place]     Medications Prior to Admission  Medication Sig Dispense Refill Last Dose   ALPRAZolam (XANAX) 0.5 MG tablet Take 1 tablet (0.5 mg total) by mouth 2 (two) times daily as needed for anxiety. 60 tablet 0 02/23/2021   amLODipine (NORVASC) 10 MG tablet Take 10 mg by mouth daily.   02/22/2021   aspirin 325 MG tablet Take 325 mg by mouth daily.   02/22/2021   atenolol (TENORMIN) 50 MG tablet Take 1 tablet (50 mg total) by mouth daily. 90 tablet 1 02/22/2021 at 1800   atorvastatin (LIPITOR) 80 MG tablet Take 80 mg by mouth daily.   02/22/2021   hydrALAZINE (APRESOLINE) 25 MG tablet Take 25 mg by mouth every 8 (eight) hours.   02/22/2021   insulin NPH Human (NOVOLIN N) 100 UNIT/ML injection Inject 0.65 mLs (65 Units total) into the skin 2 (two) times daily before a meal. (Patient taking differently: Inject 100 Units into the skin 2 (two) times daily.) 20 mL prn 02/22/2021   NOVOLIN R FLEXPEN RELION 100 UNIT/ML SOPN Inject 15 Units as directed  3 (three) times daily with meals.   02/22/2021   omeprazole (PRILOSEC) 40 MG capsule Take 40 mg by mouth daily.   02/22/2021   pregabalin (LYRICA) 200 MG capsule Take 200 mg by mouth 3 (three) times daily.   01/02/7823   TRULICITY 3 MP/5.3IR SOPN Inject 3 mg into the skin once a week. [name]   02/22/2021   vitamin B-12 (CYANOCOBALAMIN) 500 MCG tablet Take 500 mcg by mouth daily.   02/22/2021   AMBULATORY NON FORMULARY  MEDICATION Accucheck Aviva Plus meter and test strips Please check glucose 3 times daily. Dx: E11.8 100 each 2    NEEDLE, DISP, 30 G (BD DISP NEEDLES) 30G X 1/2" MISC Inject into skin twice daily before meals. 100 each PRN    simvastatin (ZOCOR) 80 MG tablet Take 80 mg by mouth at bedtime. (Patient not taking: No sig reported)   Not Taking   Syringe, Disposable, 1 ML MISC Inject into skin twice daily before meals. 60 each PRN     Inpatient Medications:   aspirin EC  81 mg Oral Daily   atorvastatin  80 mg Oral Daily   clopidogrel  75 mg Oral Daily   insulin aspart  0-15 Units Subcutaneous TID WC   insulin glargine  16 Units Subcutaneous QHS   pantoprazole  40 mg Oral Daily   sodium chloride flush  3 mL Intravenous Once    Allergies:  Allergies  Allergen Reactions   Amitriptyline Other (See Comments)    Vision change.  Other reaction(s): Other Vision change.    Hydrochlorothiazide     REACTION: cause,weird feelings Other reaction(s): Other (See Comments) REACTION: cause,weird feelings REACTION: cause,weird feelings    Liraglutide Nausea And Vomiting    REACTION: N/V REACTION: N/V    Metformin Swelling    REACTION: swelling REACTION: swelling    Nortriptyline Other (See Comments)    Headache Other reaction(s): Other (See Comments) Headache Headache    Prednisone     Very high sugars 600 and above.  Other reaction(s): Other (See Comments) Very high sugars 600 and above.  Very high sugars 600 and above.     Tramadol Nausea And Vomiting    Other reaction(s): Nausea And Vomiting    Farxiga [Dapagliflozin]     Increased kidney function    Social History   Socioeconomic History   Marital status: Married    Spouse name: Not on file   Number of children: Not on file   Years of education: Not on file   Highest education level: Not on file  Occupational History   Not on file  Tobacco Use   Smoking status: Former    Types: Cigarettes    Quit date: 08/09/2003     Years since quitting: 17.5   Smokeless tobacco: Never  Vaping Use   Vaping Use: Never used  Substance and Sexual Activity   Alcohol use: No   Drug use: No   Sexual activity: Not on file    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  Other Topics Concern   Not on file  Social History Narrative   Not on file   Social Determinants of Health   Financial Resource Strain: Not on file  Food Insecurity: Not on file  Transportation Needs: Not on file  Physical Activity: Not on file  Stress: Not on file  Social Connections: Not on file  Intimate Partner  Violence: Not on file     Family History  Problem Relation Age of Onset   Cancer Mother        breast and kidney   Kidney disease Son    Cancer Other        pancreatic cancer      Review of Systems: All other systems reviewed and are otherwise negative except as noted above.  Physical Exam: Vitals:   02/24/21 2013 02/24/21 2356 02/25/21 0416 02/25/21 0735  BP: 134/77 134/67 125/66 135/88  Pulse: 86 89 87 88  Resp: 16 16 18 18   Temp: 99.4 F (37.4 C) 98.8 F (37.1 C) 98.3 F (36.8 C) 98.3 F (36.8 C)  TempSrc: Oral Oral Oral Oral  SpO2: 97% 95% 95% 93%  Weight:      Height:        GEN- The patient is well appearing, alert and oriented x 3 today.   Head- normocephalic, atraumatic Eyes-  Sclera clear, conjunctiva pink Ears- hearing intact Oropharynx- clear Neck- supple Lungs- Clear to ausculation bilaterally, normal work of breathing Heart- Regular rate and rhythm, no murmurs, rubs or gallops  GI- soft, NT, ND, + BS Extremities- no clubbing, cyanosis, or edema MS- no significant deformity or atrophy Skin- no rash or lesion Psych- euthymic mood, full affect   Labs:   Lab Results  Component Value Date   WBC 8.2 02/24/2021   HGB 10.5 (L) 02/24/2021   HCT 33.8 (L) 02/24/2021   MCV 88.5 02/24/2021    PLT 232 02/24/2021    Recent Labs  Lab 02/23/21 1316 02/23/21 1318 02/25/21 0030  NA 140   < > 138  K 4.3   < > 4.3  CL 105   < > 104  CO2 25   < > 26  BUN 25*   < > 22  CREATININE 1.86*   < > 1.57*  CALCIUM 9.3   < > 9.1  PROT 6.9  --   --   BILITOT 0.6  --   --   ALKPHOS 63  --   --   ALT 16  --   --   AST 16  --   --   GLUCOSE 179*   < > 285*   < > = values in this interval not displayed.     Radiology/Studies: MR ANGIO HEAD WO CONTRAST  Result Date: 02/23/2021 CLINICAL DATA:  Acute neuro deficit, stroke suspected. Left-sided weakness. EXAM: MRI HEAD WITHOUT CONTRAST MRA HEAD WITHOUT CONTRAST MRA NECK WITHOUT CONTRAST TECHNIQUE: Multiplanar, multiecho pulse sequences of the brain and surrounding structures were obtained without intravenous contrast. Angiographic images of the Circle of Willis were obtained using MRA technique without intravenous contrast. Angiographic images of the neck were obtained using MRA technique without intravenous contrast. Carotid stenosis measurements (when applicable) are obtained utilizing NASCET criteria, using the distal internal carotid diameter as the denominator. COMPARISON:  Head CT 02/22/2021. Head and neck CTA 12/17/2020. Head MRI 12/12/2020. FINDINGS: MRI HEAD FINDINGS The study is mildly motion degraded. Brain: There is no evidence of an acute infarct, mass, or midline shift. Confluent T2 hyperintensities in the cerebral white matter bilaterally are unchanged from the prior MRI and are nonspecific but compatible with severe chronic small vessel ischemic disease. There is mild cerebral atrophy. Small bilateral subdural fluid collections over both cerebral convexities on the prior MRI of decreased in size with a residual 4 mm thick collection on the right and trace collection on the left. 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 Vascular: Major intracranial vascular flow voids are preserved. Skull and upper cervical spine: Unremarkable bone marrow signal. Sinuses/Orbits: Unremarkable orbits. Small mucous retention cysts in the maxillary sinuses, right larger than left. Clear mastoid air cells. Other: None. MRA HEAD FINDINGS The study is mildly motion degraded. The intracranial vertebral arteries are widely patent to the basilar with the left being dominant. Patent PICA and SCA origins are seen bilaterally. Basilar artery is widely patent. Both PCAs are patent with mild irregularity but no flow limiting proximal stenosis. The internal carotid arteries are patent from skull base to carotid termini. Multifocal signal loss involving the petrous, cavernous, and proximal supraclinoid segments bilaterally is favored to reflect artifact, without a significant stenosis demonstrated on the recent prior CTA. ACAs and MCAs are patent with mild irregularity but no evidence of a flow limiting proximal stenosis or proximal branch occlusion. No aneurysm is identified. MRA NECK FINDINGS Image quality is degraded by noncontrast technique, moderate motion, and wrap artifact. The aortic arch, arch vessel origins, and proximal aspects of the common carotid and vertebral arteries are not well evaluated. The mid and distal portions of the common carotid arteries as well as the cervical internal carotid arteries are patent without a definite flow limiting stenosis within limitations of motion. The vertebral arteries are patent with antegrade flow bilaterally with the left being moderately dominant. The V1 segments are not well evaluated, and there is artifactual signal loss in both V3 segments distally. No definite flow limiting stenosis is evident in the V2 or proximal V3 segments. Apparent mild narrowing of the proximal to mid left V2 segment on reformats may be due to artifact based  on source images. IMPRESSION: 1. No acute intracranial abnormality. 2. Severe chronic small vessel ischemic disease. 3. Small residual bilateral subdural fluid collections as above, decreased in size from 12/12/2020 and without mass effect. 4. Motion degraded head and neck MRAs.  No large vessel occlusion. Electronically Signed   By: Logan Bores M.D.   On: 02/23/2021 19:33   MR ANGIO NECK WO CONTRAST  Result Date: 02/23/2021 CLINICAL DATA:  Acute neuro deficit, stroke suspected. Left-sided weakness. EXAM: MRI HEAD WITHOUT CONTRAST MRA HEAD WITHOUT CONTRAST MRA NECK WITHOUT CONTRAST TECHNIQUE: Multiplanar, multiecho pulse sequences of the brain and surrounding structures were obtained without intravenous contrast. Angiographic images of the Circle of Willis were obtained using MRA technique without intravenous contrast. Angiographic images of the neck were obtained using MRA technique without intravenous contrast. Carotid stenosis measurements (when applicable) are obtained utilizing NASCET criteria, using the distal internal carotid diameter as the denominator. COMPARISON:  Head CT 02/22/2021. Head and neck CTA 12/17/2020. Head MRI 12/12/2020. FINDINGS: MRI HEAD FINDINGS The study is mildly motion degraded. Brain: There is no evidence of an acute infarct, mass, or midline shift. Confluent T2 hyperintensities in the cerebral white matter bilaterally are unchanged from the prior MRI and are nonspecific but compatible with severe chronic small vessel ischemic disease. There is mild cerebral atrophy. Small bilateral subdural fluid collections over both cerebral convexities on the prior MRI of decreased in size with a residual 4 mm thick collection on the right and trace collection on the left. These are again noted to largely follow CSF in signal intensity, however there are some scattered foci of susceptibility artifact over the convexities suggesting that  these may reflect chronic subdural hematomas rather than  simple hygromas. Vascular: Major intracranial vascular flow voids are preserved. Skull and upper cervical spine: Unremarkable bone marrow signal. Sinuses/Orbits: Unremarkable orbits. Small mucous retention cysts in the maxillary sinuses, right larger than left. Clear mastoid air cells. Other: None. MRA HEAD FINDINGS The study is mildly motion degraded. The intracranial vertebral arteries are widely patent to the basilar with the left being dominant. Patent PICA and SCA origins are seen bilaterally. Basilar artery is widely patent. Both PCAs are patent with mild irregularity but no flow limiting proximal stenosis. The internal carotid arteries are patent from skull base to carotid termini. Multifocal signal loss involving the petrous, cavernous, and proximal supraclinoid segments bilaterally is favored to reflect artifact, without a significant stenosis demonstrated on the recent prior CTA. ACAs and MCAs are patent with mild irregularity but no evidence of a flow limiting proximal stenosis or proximal branch occlusion. No aneurysm is identified. MRA NECK FINDINGS Image quality is degraded by noncontrast technique, moderate motion, and wrap artifact. The aortic arch, arch vessel origins, and proximal aspects of the common carotid and vertebral arteries are not well evaluated. The mid and distal portions of the common carotid arteries as well as the cervical internal carotid arteries are patent without a definite flow limiting stenosis within limitations of motion. The vertebral arteries are patent with antegrade flow bilaterally with the left being moderately dominant. The V1 segments are not well evaluated, and there is artifactual signal loss in both V3 segments distally. No definite flow limiting stenosis is evident in the V2 or proximal V3 segments. Apparent mild narrowing of the proximal to mid left V2 segment on reformats may be due to artifact based on source images. IMPRESSION: 1. No acute intracranial  abnormality. 2. Severe chronic small vessel ischemic disease. 3. Small residual bilateral subdural fluid collections as above, decreased in size from 12/12/2020 and without mass effect. 4. Motion degraded head and neck MRAs.  No large vessel occlusion. Electronically Signed   By: Logan Bores M.D.   On: 02/23/2021 19:33   MR BRAIN WO CONTRAST  Result Date: 02/23/2021 CLINICAL DATA:  Acute neuro deficit, stroke suspected. Left-sided weakness. EXAM: MRI HEAD WITHOUT CONTRAST MRA HEAD WITHOUT CONTRAST MRA NECK WITHOUT CONTRAST TECHNIQUE: Multiplanar, multiecho pulse sequences of the brain and surrounding structures were obtained without intravenous contrast. Angiographic images of the Circle of Willis were obtained using MRA technique without intravenous contrast. Angiographic images of the neck were obtained using MRA technique without intravenous contrast. Carotid stenosis measurements (when applicable) are obtained utilizing NASCET criteria, using the distal internal carotid diameter as the denominator. COMPARISON:  Head CT 02/22/2021. Head and neck CTA 12/17/2020. Head MRI 12/12/2020. FINDINGS: MRI HEAD FINDINGS The study is mildly motion degraded. Brain: There is no evidence of an acute infarct, mass, or midline shift. Confluent T2 hyperintensities in the cerebral white matter bilaterally are unchanged from the prior MRI and are nonspecific but compatible with severe chronic small vessel ischemic disease. There is mild cerebral atrophy. Small bilateral subdural fluid collections over both cerebral convexities on the prior MRI of decreased in size with a residual 4 mm thick collection on the right and trace collection on the left. 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 Vascular: Major intracranial vascular flow  voids are preserved. Skull and upper cervical  spine: Unremarkable bone marrow signal. Sinuses/Orbits: Unremarkable orbits. Small mucous retention cysts in the maxillary sinuses, right larger than left. Clear mastoid air cells. Other: None. MRA HEAD FINDINGS The study is mildly motion degraded. The intracranial vertebral arteries are widely patent to the basilar with the left being dominant. Patent PICA and SCA origins are seen bilaterally. Basilar artery is widely patent. Both PCAs are patent with mild irregularity but no flow limiting proximal stenosis. The internal carotid arteries are patent from skull base to carotid termini. Multifocal signal loss involving the petrous, cavernous, and proximal supraclinoid segments bilaterally is favored to reflect artifact, without a significant stenosis demonstrated on the recent prior CTA. ACAs and MCAs are patent with mild irregularity but no evidence of a flow limiting proximal stenosis or proximal branch occlusion. No aneurysm is identified. MRA NECK FINDINGS Image quality is degraded by noncontrast technique, moderate motion, and wrap artifact. The aortic arch, arch vessel origins, and proximal aspects of the common carotid and vertebral arteries are not well evaluated. The mid and distal portions of the common carotid arteries as well as the cervical internal carotid arteries are patent without a definite flow limiting stenosis within limitations of motion. The vertebral arteries are patent with antegrade flow bilaterally with the left being moderately dominant. The V1 segments are not well evaluated, and there is artifactual signal loss in both V3 segments distally. No definite flow limiting stenosis is evident in the V2 or proximal V3 segments. Apparent mild narrowing of the proximal to mid left V2 segment on reformats may be due to artifact based on source images. IMPRESSION: 1. No acute intracranial abnormality. 2. Severe chronic small vessel ischemic disease. 3.  Small residual bilateral subdural fluid collections as above, decreased in size from 12/12/2020 and without mass effect. 4. Motion degraded head and neck MRAs.  No large vessel occlusion. Electronically Signed   By: Logan Bores M.D.   On: 02/23/2021 19:33   EEG adult  Result Date: 02/24/2021 Lora Havens, MD     02/24/2021  1:04 PM Patient Name: Erin [name] MRN: [id number] Epilepsy Attending: Lora Havens Referring Physician/Provider: [name], NP Date: 02/24/2021 Duration: 24.38 mins Patient history: 65 year old female with transient weakness, left-sided facial droop and speech disturbance.  EEG to evaluate for seizures. Level of alertness: Awake AEDs during EEG study: None Technical aspects: This EEG study was done with scalp electrodes positioned according to the 10-20 International system of electrode placement. Electrical activity was acquired at a sampling rate of 500Hz  and reviewed with a high frequency filter of 70Hz  and a low frequency filter of 1Hz . EEG data were recorded continuously and digitally stored. Description: The posterior dominant rhythm consists of 9 Hz activity of moderate voltage (25-35 uV) seen predominantly in posterior head regions, symmetric and reactive to eye opening and eye closing. Hyperventilation and photic stimulation were not performed.   IMPRESSION: This study is within normal limits. No seizures or epileptiform discharges were seen throughout the recording. Lora Havens   ECHOCARDIOGRAM COMPLETE  Result Date: 02/23/2021    ECHOCARDIOGRAM REPORT   Patient Name:   Erin [name] Date of Exam: 02/23/2021 Medical Rec #:  [id number]         Height:       66.0 in Accession #:    [id number]        Weight:       202.4 lb Date of Birth:  1955-08-28         BSA:  2.010 m Patient Age:    40 years          BP:           122/62 mmHg Patient Gender: F                 HR:           89 bpm. Exam Location:  Inpatient Procedure: 2D Echo, Cardiac Doppler and  Color Doppler Indications:    TIA  History:        Patient has no prior history of Echocardiogram examinations.                 Risk Factors:Obesity.  Sonographer:    Merrie Roof RDCS Referring Phys: [place]  1. Left ventricular ejection fraction, by estimation, is 55 to 60%. The left ventricle has normal function. The left ventricle has no regional wall motion abnormalities. Left ventricular diastolic parameters are consistent with Grade I diastolic dysfunction (impaired relaxation).  2. Right ventricular systolic function is normal. The right ventricular size is normal. Tricuspid regurgitation signal is inadequate for assessing PA pressure.  3. The mitral valve is normal in structure. No evidence of mitral valve regurgitation. No evidence of mitral stenosis.  4. The aortic valve is normal in structure. Aortic valve regurgitation is not visualized. No aortic stenosis is present.  5. The inferior vena cava is normal in size with greater than 50% respiratory variability, suggesting right atrial pressure of 3 mmHg. FINDINGS  Left Ventricle: Left ventricular ejection fraction, by estimation, is 55 to 60%. The left ventricle has normal function. The left ventricle has no regional wall motion abnormalities. The left ventricular internal cavity size was normal in size. There is  no left ventricular hypertrophy. Left ventricular diastolic parameters are consistent with Grade I diastolic dysfunction (impaired relaxation). Indeterminate filling pressures. Right Ventricle: The right ventricular size is normal. No increase in right ventricular wall thickness. Right ventricular systolic function is normal. Tricuspid regurgitation signal is inadequate for assessing PA pressure. Left Atrium: Left atrial size was normal in size. Right Atrium: Right atrial size was normal in size. Pericardium: There is no evidence of pericardial effusion. Mitral Valve: The mitral valve is normal in structure. No  evidence of mitral valve regurgitation. No evidence of mitral valve stenosis. Tricuspid Valve: The tricuspid valve is normal in structure. Tricuspid valve regurgitation is not demonstrated. No evidence of tricuspid stenosis. Aortic Valve: The aortic valve is normal in structure. Aortic valve regurgitation is not visualized. No aortic stenosis is present. Aortic valve mean gradient measures 3.0 mmHg. Aortic valve peak gradient measures 4.8 mmHg. Aortic valve area, by VTI measures 3.94 cm. Pulmonic Valve: The pulmonic valve was normal in structure. Pulmonic valve regurgitation is not visualized. No evidence of pulmonic stenosis. Aorta: The aortic root is normal in size and structure. Venous: The inferior vena cava is normal in size with greater than 50% respiratory variability, suggesting right atrial pressure of 3 mmHg. IAS/Shunts: No atrial level shunt detected by color flow Doppler.  LEFT VENTRICLE PLAX 2D LVIDd:         3.90 cm  Diastology LV PW:         1.00 cm  LV e' medial:    5.44 cm/s LV IVS:        1.10 cm  LV E/e' medial:  11.7 LVOT diam:     2.00 cm  LV e' lateral:   5.00 cm/s LV SV:  70       LV E/e' lateral: 12.7 LV SV Index:   35 LVOT Area:     3.14 cm  RIGHT VENTRICLE RV Basal diam:  2.80 cm LEFT ATRIUM           Index       RIGHT ATRIUM          Index LA Vol (A4C): 24.5 ml 12.19 ml/m RA Area:     9.58 cm                                   RA Volume:   19.50 ml 9.70 ml/m  AORTIC VALVE AV Area (Vmax):    3.52 cm AV Area (Vmean):   3.38 cm AV Area (VTI):     3.94 cm AV Vmax:           109.00 cm/s AV Vmean:          73.600 cm/s AV VTI:            0.177 m AV Peak Grad:      4.8 mmHg AV Mean Grad:      3.0 mmHg LVOT Vmax:         122.00 cm/s LVOT Vmean:        79.100 cm/s LVOT VTI:          0.222 m LVOT/AV VTI ratio: 1.25  AORTA Ao Root diam: 3.30 cm Ao Asc diam:  3.30 cm MITRAL VALVE MV Area (PHT): 3.91 cm     SHUNTS MV Decel Time: 194 msec     Systemic VTI:  0.22 m MV E velocity: 63.40 cm/s    Systemic Diam: 2.00 cm MV A velocity: 114.00 cm/s MV E/A ratio:  0.56 Mihai Croitoru MD Electronically signed by Sanda Klein MD Signature Date/Time: 02/23/2021/5:41:08 PM    Final    CT HEAD CODE STROKE WO CONTRAST  Result Date: 02/23/2021 CLINICAL DATA:  Code stroke.  Neuro deficit, acute stroke suspected. EXAM: CT HEAD WITHOUT CONTRAST TECHNIQUE: Contiguous axial images were obtained from the base of the skull through the vertex without intravenous contrast. COMPARISON:  Dec 11, 2020. (Dec 17, 2020 CT head not retrievable at the time of dictation). FINDINGS: Brain: No evidence of acute infarction, hemorrhage, hydrocephalus, or mass lesion. 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 Moderate patchy white matter hypoattenuation, nonspecific but most likely related to chronic microvascular ischemic disease. Vascular: No hyperdense vessel identified. Calcific intracranial atherosclerosis. Skull: No acute fracture. Nonspecific lucent lesion in the right frontal calvarium, unchanged from prior. Sinuses/Orbits: Right maxillary sinus retention cyst. Other: No mastoid effusions. ASPECTS [facility] Stroke Program Early CT Score) Total score (0-10 with 10 being normal): 10. IMPRESSION: 1. No evidence of acute large vascular territory infarct or acute hemorrhage.ASPECTS is 10 2. Moderate chronic microvascular ischemic disease. An MRI could provide more sensitive evaluation for acute white matter infarct clinically indicated. 3. 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 Code stroke imaging results were communicated on 02/23/2021 at 1:25 pm to provider Dr. Quinn Axe Via secure text paging. Electronically Signed   By: Margaretha Sheffield MD   On: 02/23/2021 13:29    12-lead ECG NSR at 94 bpm (personally reviewed) All prior EKG's in EPIC reviewed with no  documented atrial fibrillation  Telemetry NSR 70-90s (personally reviewed)  Assessment and Plan:  1. Cryptogenic stroke The patient presents with cryptogenic stroke.  The patient does have a TEE planned for this AM.  I spoke at length with the patient about monitoring for afib with an implantable loop recorder.  Risks, benefits, and alteratives to implantable loop recorder were discussed with the patient today.   At this time, the patient is very clear in their decision to proceed with implantable loop recorder if indicated based on results of TEE.   Wound care was reviewed with the patient (keep incision clean and dry for 3 days).  Wound check scheduled and entered in AVS. Please call with questions.   If PFO will need DVT US.   Shirley Friar, PA-C 02/25/2021 8:49 AM

## 2021-02-25 NOTE — Progress Notes (Signed)
PT Cancellation Note  Patient Details Name: Erin [name] MRN: [id number] DOB: 10/06/1955   Cancelled Treatment:    Reason Eval/Treat Not Completed: Patient at procedure or test/unavailable In AM, patient at endo for TEE. Checked back this PM, patient in cath lab. PT will re-attempt as time allows.   Keiley Levey A. Gilford Rile PT, DPT Acute Rehabilitation Services Pager [phone] Office [phone]    [name] 02/25/2021, 3:48 PM

## 2021-02-25 NOTE — Hospital Course (Signed)
TIA versus stroke Patient presented with left-sided weakness and facial droop. She has multiple stroke risk factors and recently had a stroke in May 2022. CT head was negative for acute intracranial abnormality. MRA head/neck showed no large vessel occlusion. MRI showed chronic small vessel ischemic disease but no acute intracranial abnormality. TTE with unrevealing findings; EF was 55-60%. She was restarted on dual antiplatelet therapy and lipitor 80 mg. Her antihypertensive medications were held for 24-48 hours to allow for permissive hypertension. On day 2 of her hospitalization, she had some word-finding difficulties. Thus, we contacted Neuro to evaluate the patient. They recommended an EEG (found to be unremarkable) and contacted Cardiology for concern that there was an embolic source responsible for her presentation. She subsequently underwent a TEE which revealed ***   Elevated creatinine Creatinine on admission 1.86, BUN 25. Baseline creatinine unclear. Her creatinine improved during her hospital stay down to ***.   Hypertension Home medications include atenolol 50 mg and hydralazine 25 mg 3 times daily. These were held for 24-48 hours to allow for permissive hypertension.   DM During her admission, she was placed on SSI. Her home diabetic regimen: ***   Patient's home medications were continued for her other chronic conditions.

## 2021-02-25 NOTE — Plan of Care (Signed)
Problem: Education: Goal: Knowledge of disease or condition will improve 02/25/2021 1804 by Laure Kidney, RN Outcome: Progressing 02/25/2021 1803 by Laure Kidney, RN Outcome: Progressing Goal: Knowledge of secondary prevention will improve 02/25/2021 1804 by Laure Kidney, RN Outcome: Progressing 02/25/2021 1803 by Laure Kidney, RN Outcome: Progressing Goal: Knowledge of patient specific risk factors addressed and post discharge goals established will improve 02/25/2021 1804 by Laure Kidney, RN Outcome: Progressing 02/25/2021 1803 by Laure Kidney, RN Outcome: Progressing   Problem: Coping: Goal: Will verbalize positive feelings about self 02/25/2021 1804 by Laure Kidney, RN Outcome: Progressing 02/25/2021 1803 by Laure Kidney, RN Outcome: Progressing Goal: Will identify appropriate support needs 02/25/2021 1804 by Laure Kidney, RN Outcome: Progressing 02/25/2021 1803 by Laure Kidney, RN Outcome: Progressing   Problem: Health Behavior/Discharge Planning: Goal: Ability to manage health-related needs will improve 02/25/2021 1804 by Laure Kidney, RN Outcome: Progressing 02/25/2021 1803 by Laure Kidney, RN Outcome: Progressing   Problem: Self-Care: Goal: Ability to participate in self-care as condition permits will improve 02/25/2021 1804 by Laure Kidney, RN Outcome: Progressing 02/25/2021 1803 by Laure Kidney, RN Outcome: Progressing Goal: Verbalization of feelings and concerns over difficulty with self-care will improve 02/25/2021 1804 by Laure Kidney, RN Outcome: Progressing 02/25/2021 1803 by Laure Kidney, RN Outcome: Progressing Goal: Ability to communicate needs accurately will improve 02/25/2021 1804 by Laure Kidney, RN Outcome: Progressing 02/25/2021 1803 by Laure Kidney, RN Outcome: Progressing   Problem: Nutrition: Goal: Risk of aspiration will decrease 02/25/2021 1804 by Laure Kidney, RN Outcome: Progressing 02/25/2021 1803 by Laure Kidney, RN Outcome: Progressing Goal: Dietary intake will improve 02/25/2021 1804 by Laure Kidney, RN Outcome: Progressing 02/25/2021 1803 by Laure Kidney, RN Outcome: Progressing   Problem: Intracerebral Hemorrhage Tissue Perfusion: Goal: Complications of Intracerebral Hemorrhage will be minimized 02/25/2021 1804 by Laure Kidney, RN Outcome: Progressing 02/25/2021 1803 by Laure Kidney, RN Outcome: Progressing   Problem: Ischemic Stroke/TIA Tissue Perfusion: Goal: Complications of ischemic stroke/TIA will be minimized 02/25/2021 1804 by Laure Kidney, RN Outcome: Progressing 02/25/2021 1803 by Laure Kidney, RN Outcome: Progressing   Problem: Spontaneous Subarachnoid Hemorrhage Tissue Perfusion: Goal: Complications of Spontaneous Subarachnoid Hemorrhage will be minimized 02/25/2021 1804 by Laure Kidney, RN Outcome: Progressing 02/25/2021 1803 by Laure Kidney, RN Outcome: Progressing   Problem: Education: Goal: Knowledge of General Education information will improve Description: Including pain rating scale, medication(s)/side effects and non-pharmacologic comfort measures 02/25/2021 1804 by Laure Kidney, RN Outcome: Progressing 02/25/2021 1803 by Laure Kidney, RN Outcome: Progressing   Problem: Health Behavior/Discharge Planning: Goal: Ability to manage health-related needs will improve 02/25/2021 1804 by Laure Kidney, RN Outcome: Progressing 02/25/2021 1803 by Laure Kidney, RN Outcome: Progressing   Problem: Clinical Measurements: Goal: Ability to maintain clinical measurements within normal limits will improve 02/25/2021 1804 by Laure Kidney, RN Outcome: Progressing 02/25/2021 1803 by Laure Kidney, RN Outcome: Progressing Goal: Will remain free from infection 02/25/2021 1804 by Laure Kidney, RN Outcome: Progressing 02/25/2021 1803 by Laure Kidney, RN Outcome: Progressing Goal: Diagnostic test results will improve 02/25/2021 1804 by Laure Kidney, RN Outcome: Progressing 02/25/2021 1803 by Laure Kidney, RN Outcome: Progressing Goal: Respiratory complications will improve 02/25/2021 1804 by Laure Kidney, RN Outcome: Progressing 02/25/2021 1803 by Laure Kidney, RN Outcome: Progressing Goal: Cardiovascular complication will be avoided 02/25/2021 1804  by Laure Kidney, RN Outcome: Progressing 02/25/2021 1803 by Laure Kidney, RN Outcome: Progressing   Problem: Activity: Goal: Risk for activity intolerance will decrease 02/25/2021 1804 by Laure Kidney, RN Outcome: Progressing 02/25/2021 1803 by Laure Kidney, RN Outcome: Progressing   Problem: Nutrition: Goal: Adequate nutrition will be maintained 02/25/2021 1804 by Laure Kidney, RN Outcome: Progressing 02/25/2021 1803 by Laure Kidney, RN Outcome: Progressing   Problem: Elimination: Goal: Will not experience complications related to bowel motility 02/25/2021 1804 by Laure Kidney, RN Outcome: Progressing 02/25/2021 1803 by Laure Kidney, RN Outcome: Progressing Goal: Will not experience complications related to urinary retention 02/25/2021 1804 by Laure Kidney, RN Outcome: Progressing 02/25/2021 1803 by Laure Kidney, RN Outcome: Progressing   Problem: Pain Managment: Goal: General experience of comfort will improve 02/25/2021 1804 by Laure Kidney, RN Outcome: Progressing 02/25/2021 1803 by Laure Kidney, RN Outcome: Progressing   Problem: Safety: Goal: Ability to remain free from injury will improve 02/25/2021 1804 by Laure Kidney, RN Outcome: Progressing 02/25/2021 1803 by Laure Kidney, RN Outcome: Progressing   Problem: Skin Integrity: Goal: Risk for impaired skin integrity will decrease 02/25/2021 1804 by Laure Kidney, RN Outcome: Progressing 02/25/2021 1803 by Laure Kidney, RN Outcome: Progressing

## 2021-02-25 NOTE — Progress Notes (Signed)
Inpatient Diabetes Program Recommendations  AACE/ADA: New Consensus Statement on Inpatient Glycemic Control (2015)  Target Ranges:  Prepandial:   less than 140 mg/dL      Peak postprandial:   less than 180 mg/dL (1-2 hours)      Critically ill patients:  140 - 180 mg/dL   Lab Results  Component Value Date   GLUCAP 279 (H) 02/25/2021   HGBA1C 8.0 (H) 02/24/2021   Results for KARL, KNARR (MRN [id number]) as of 02/25/2021 11:01  Ref. Range 02/24/2021 07:59 02/24/2021 12:06 02/24/2021 16:16 02/24/2021 21:20 02/25/2021 10:56  Glucose-Capillary Latest Ref Range: 70 - 99 mg/dL 288 (H) 357 (H) 377 (H) 320 (H) 279 (H)   Review of Glycemic Control  Diabetes history: type 2 Outpatient Diabetes medications: NPH insulin (Novolin) 100 units BID, Novolin insulin Regular 15 units TID, Trulicity 3 mg weekly Current orders for Inpatient glycemic control: Lantus 16 units at HS, Novolog 0-15 units TID  Inpatient Diabetes Program Recommendations:   Noted that blood sugars have been greater than 180 mg/dl. Recommend increasing Lantus to 20 units BID if blood sugars continue to be elevated. Titrate dosages as needed.   Harvel Ricks RN BSN CDE Diabetes Coordinator Pager: [phone]  8am-5pm

## 2021-02-25 NOTE — Interval H&P Note (Signed)
History and Physical Interval Note:  02/25/2021 9:17 AM  Erin [name]  has presented today for surgery, with the diagnosis of STROKE.  The various methods of treatment have been discussed with the patient and family. After consideration of risks, benefits and other options for treatment, the patient has consented to  Procedure(s): TRANSESOPHAGEAL ECHOCARDIOGRAM (TEE) (N/A) as a surgical intervention.  The patient's history has been reviewed, patient examined, no change in status, stable for surgery.  I have reviewed the patient's chart and labs.  Questions were answered to the patient's satisfaction.     Dorris Carnes

## 2021-02-25 NOTE — Progress Notes (Signed)
Subjective: The patient was seen at bedside during rounds today. She denies numbness and weakness today. She states that she does not have as much difficulty with her word-finding this morning. The patient otherwise has no other complaints or concerns.  Objective:  Vital signs in last 24 hours: Vitals:   02/25/21 0857 02/25/21 1010 02/25/21 1030 02/25/21 1101  BP: (!) 149/94 124/68 137/85 (!) 133/91  Pulse: 95 97 94 94  Resp: 14 19 15 16   Temp: 98.5 F (36.9 C) 98 F (36.7 C)  98.4 F (36.9 C)  TempSrc: Oral Temporal  Oral  SpO2: 100% 97% 99%   Weight:      Height:        General: NAD, nl appearance HE: Normocephalic, atraumatic , EOMI, Conjunctivae normal ENT: No congestion, no rhinorrhea, no exudate or erythema  Cardiovascular: Normal rate, regular rhythm. No murmurs, rubs, or gallops Pulmonary: Effort normal, breath sounds normal. No wheezes, rales, or rhonchi Abdominal: soft, nontender, bowel sounds present Musculoskeletal: no swelling, deformity, injury, or tenderness in extremities Skin: Warm, dry, no bruising, erythema, or rash  Neurologic exam: Mental status: A&Ox3.  Cranial Nerves:             II: PERRL             V, VII: Face symmetric             VIII: Hearing is grossly intact              IX, X: Palate rises symmetrically             XII: Tongue midline    Motor: Strength 5/5 on all upper and lower extremities, bulk muscle and tone are normal  Psychiatric: Normal mood and affect    Assessment/Plan:  Active Problems:   Transient ischemic attack   TIA (transient ischemic attack)  #TIA, with net onset word-finding difficulty (resolved) Patient presented with left-sided weakness and facial droop. She has multiple stroke risk factors and recently had a stroke in May 2022. CT head was negative for acute intracranial abnormality. MRA head/neck showed no large vessel occlusion. MRI showed chronic small vessel ischemic disease but no acute intracranial  abnormality. TTE with unrevealing findings; EF was 55-60%.  Neurology and Cardiology are on board. They are recommending a TEE for further evaluation due to high suspicion that there is an embolic source responsible for her presentation. She was made NPO last night and will undergo TEE at 9:30 AM. - Neurology is on board; greatly appreciate their recs - Greatly appreciate cardiology's assistance in managing this patient - TEE at 9:30 AM. Full report is currently pending - Continue Plavix 75 mg and Aspirin 81 mg daily for 3 weeks followed with aspirin alone - Continue Lipitor 80 mg - PT/OT -Frequent neurochecks   Elevated creatinine Creatinine on admission 1.86, BUN 25. Baseline creatinine unclear, last admission in May showed a creatinine of 1.86. GFR was decreased in the 20s to 30s during her last hospitalization. Improving, now at 1.57. Will continue to follow.  -Trend BMP -Avoid nephrotoxic agents   Hypertension Home medications include atenolol 50 mg and hydralazine 25 mg 3 times daily. BP is well-controlled at this time. Will continue to hold these medications.   DM Per chart review multiple different diabetic medications listed, will need to confirm with patient her diabetic regimen.  Medications include Trulicity 3 mg weekly, NovoLog 15 units daily with meals and insulin 80 units twice daily with meals. -SSI - Lantus  30 Unites units, daily at bedtime -Confirm diabetes regimen with patient tomorrow prior to discharge   GERD - Continue protonix, 40 mg daily  Prior to Admission Living Arrangement: Home Anticipated Discharge Location: Home Barriers to Discharge: Stroke workup Dispo: Anticipated discharge in approximately 1-2 day(s).   Orvis Brill, MD 02/25/2021, 11:10 AM Pager: [phone] After 5pm on weekdays and 1pm on weekends: On Call pager [phone]

## 2021-02-25 NOTE — Progress Notes (Signed)
Bilateral lower extremity venous duplex completed. Refer to "CV Proc" under chart review to view preliminary results.  02/25/2021 2:50 PM Kelby Aline., MHA, RVT, RDCS, RDMS

## 2021-02-25 NOTE — Plan of Care (Signed)

## 2021-02-25 NOTE — Progress Notes (Addendum)
STROKE TEAM PROGRESS NOTE    Interval History  No acute events overnight, patient underwent TEE today that revealed a PFO.   BLE Doppler ordered to evaluate for DVT.   Given she has been on DAPT and had events while on this regimen plan to start DAPT with Brilinta and ASA tomorrow followed by ASA monotherapy.   Neurological exam remains stable.   Pertinent Lab Work and Imaging    02/23/21 CT Head WO IV Contrast  02/23/21 MR Angio Head and Neck  Motion degraded head and neck MRAs.  No large vessel occlusion.  02/23/21 MRI Brain WO IV Contrast NAICP  02/23/21 Echocardiogram Complete   1. Left ventricular ejection fraction, by estimation, is 55 to 60%. The left ventricle has normal function. The left ventricle has no regional wall motion abnormalities. Left ventricular diastolic parameters are consistent with Grade I diastolic dysfunction (impaired relaxation).   2. Right ventricular systolic function is normal. The right ventricular size is normal. Tricuspid regurgitation signal is inadequate for assessing PA pressure.   3. The mitral valve is normal in structure. No evidence of mitral valve regurgitation. No evidence of mitral stenosis.   4. The aortic valve is normal in structure. Aortic valve regurgitation is not visualized. No aortic stenosis is present.   5. The inferior vena cava is normal in size with greater than 50% respiratory variability, suggesting right atrial pressure of 3 mmHg.   Physical Examination   Constitutional: Pleasant middle-aged Caucasian lady not in distress.  Appears anxious Cardiovascular: Normal RR Respiratory: No increased WOB   Mental status: AAOx4, following commands Speech: Fluent with intact repetition and naming  Cranial nerves: EOMI, VFF, Subtle Left Facial Droop, Tongue midline  Motor: Normal bulk and tone. No drift. RUE + RLE 5/5 LUE 5/5 LLE 4/5 hip flexor 5/5 knee extension/flexion, 5/5 dorsi/plantar flexion  Sensory: Intact to light touch  throughout  Coordination: Intact FNF  Gait: Deferred   NIHSS: 1  Assessment and Plan   Erin [name] is a 65 y.o. 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 Observers noted a left facial droop and patient noted difficulty getting her words out- sx resolved upon arriving to the hospital.   She was not a candidate for IVTPA given sx resolution, not a candidate for thrombectomy given lack of cortical signs on exam indicating LVO at the time.   #BLE Weakness #Left Facial Droop #Prior h/o silent right temporal stroke and left hemisphericTIA  Patient presented with the symptoms described above. At this time, stroke work up is ongoing. In the month of May, patient had two other admissions- 5/6 she presented to [facility] ED with word finding difficulties- MRI Brain showed a right posterior temporal parietal stroke. Started on DAPT. On 5/12 she presented once again with word finding difficulty and slurred speech- stroke work up was negative at the time and her DAPT was continued. It was felt that she had a TIA vs more of a stress response.   This admission she presents with BLE weakness and neurological exam is pertinent for subtle left facial droop + slight left hip flexor weakness. FD is new per patient and not documented in her outpatient neuro follow up note on 12/21/20.  Further stroke work up was done this admission including TEE. TEE showed a PFO. Given this BLE  [place] was ordered to evaluate for DVT. Her other work up done this admission included MRI Brain that was negative for acute intracranial pathology, unremarkable MRA Head and Neck, Echo w/EF 55 to 60 %, stroke labs w/LDL 53 and hemoglobin a1C 8.0.  - Follow up BLE [place] to evaluate for DVT. Ordered 02/25/21  - Her RoPE score is 2, indicating a low  probability that her stroke is due to her PFO  - Transition DAPT from Aspirin and Plavix to Aspirin and Brilinta starting on 7/22 for 21 days followed by Aspirin monotherapy ( DAPT from 02/26/21 to 03/19/21)  - Continue Atorvastatin 80 mg for secondary stroke prevention  - Amb referral to neurology placed for stroke follow up   #Permissive Hypertension No history of hypertension and pressures are trending normotensive. Recommend permissive hypertension for the first 24-48 hours then gently lower blood pressure avoiding acute drops.  - Long term blood pressure goal is < 140/90.  #Hyperlipidemia From a stroke prevention stand point, the LDL goal is < 70. LDL is 53, continue home Atorvastatin 80 mg.   #Stroke Dysphagia Screening Passed bedside swallow evaluation for regular diet, carb controlled.   #DMII  Hemoglobin A1C this admission noted to be 8.0. Recommend treating DMII with SSI while inpatient. PCP to manage on an outpatient basis.   Hospital day # 1  [name], NP  [facility] Nurse Practitioner Patient seen and discussed with attending physician Dr. Leonie Man   Stroke MD note:  TEE shows small PFO but likely incidental and LE dopplers negative for DVT. Recommend loop recorder for PAF and  dc home on DAPT.Long discussion with patient and answered questions.  Greater than 50% time during this 25-minute visit was spent in counseling and coordination of care about TIAs and embolic stroke and answering questions. Antony Contras, MD Medical Director [facility] Stroke [facility] Pager: [phone] 02/25/2021 8:19 AM   To contact Stroke Continuity provider, please refer to [email or web address] After hours, contact General Neurology

## 2021-02-25 NOTE — Discharge Instructions (Addendum)
Care After Your Loop Recorder  You have a [employer] Loop Recorder   Monitor your cardiac device site for redness, swelling, and drainage. Call the device clinic at [phone] if you experience these symptoms or fever/chills.  You may shower 3 days after your loop recorder implant and wash your incision with soap and water. Avoid lotions, ointments, or perfumes over your incision until it is well-healed.  You may use a hot tub or a pool AFTER your wound check appointment if the incision is completely closed.  Your device is MRI compatible.   Remote monitoring is used to monitor your cardiac device from home. This monitoring is scheduled every month days by our office. It allows [place] to keep an eye on the functioning of your device to ensure it is working properly.     Instructions: You were admitted to the hospital for a transient ischemic attack. You will take aspirin and brilinta together for 21 days, and then you will take aspirin alone. You will continue to take your statin to decrease your risk of a stroke. Please resume your home insulin regimen when you get home. For you blood pressure medications (amlodipine, atenolol, and hydralazine), you will resume these tomorrow. You will follow-up with Neurology. Please follow-up with your primary care physician in 1-2 weeks.

## 2021-02-25 NOTE — Anesthesia Preprocedure Evaluation (Addendum)
Anesthesia Evaluation  Patient identified by MRN, date of birth, ID band Patient awake    Reviewed: Allergy & Precautions, NPO status , Patient's Chart, lab work & pertinent test results, reviewed documented beta blocker date and time   Airway Mallampati: III  TM Distance: >3 FB Neck ROM: Full  Mouth opening: Limited Mouth Opening  Dental  (+) Edentulous Upper, Edentulous Lower, Dental Advisory Given   Pulmonary neg pulmonary ROS, former smoker,    Pulmonary exam normal breath sounds clear to auscultation       Cardiovascular hypertension, Pt. on home beta blockers and Pt. on medications Normal cardiovascular exam Rhythm:Regular Rate:Normal  TTE 2022 1. Left ventricular ejection fraction, by estimation, is 55 to 60%. The  left ventricle has normal function. The left ventricle has no regional  wall motion abnormalities. Left ventricular diastolic parameters are  consistent with Grade I diastolic  dysfunction (impaired relaxation).  2. Right ventricular systolic function is normal. The right ventricular  size is normal. Tricuspid regurgitation signal is inadequate for assessing  PA pressure.  3. The mitral valve is normal in structure. No evidence of mitral valve  regurgitation. No evidence of mitral stenosis.  4. The aortic valve is normal in structure. Aortic valve regurgitation is  not visualized. No aortic stenosis is present.  5. The inferior vena cava is normal in size with greater than 50%  respiratory variability, suggesting right atrial pressure of 3 mmHg.   Neuro/Psych Anxiety CVA (left arm weakness), Residual Symptoms negative psych ROS   GI/Hepatic negative GI ROS, Neg liver ROS,   Endo/Other  diabetes, Poorly Controlled, Insulin Dependent  Renal/GU Renal InsufficiencyRenal disease (Cr 1.57, K 4.3)  negative genitourinary   Musculoskeletal negative musculoskeletal ROS (+)   Abdominal   Peds   Hematology negative hematology ROS (+)   Anesthesia Other Findings TEE for stroke work up  Reproductive/Obstetrics                            Anesthesia Physical Anesthesia Plan  ASA: 3  Anesthesia Plan: MAC   Post-op Pain Management:    Induction: Intravenous  PONV Risk Score and Plan: Propofol infusion and Treatment may vary due to age or medical condition  Airway Management Planned: Natural Airway  Additional Equipment:   Intra-op Plan:   Post-operative Plan:   Informed Consent: I have reviewed the patients History and Physical, chart, labs and discussed the procedure including the risks, benefits and alternatives for the proposed anesthesia with the patient or authorized representative who has indicated his/her understanding and acceptance.     Dental advisory given  Plan Discussed with: CRNA  Anesthesia Plan Comments:         Anesthesia Quick Evaluation

## 2021-02-25 NOTE — Progress Notes (Signed)
  Echocardiogram Echocardiogram Transesophageal has been performed.  Erin [name] 02/25/2021, 10:39 AM

## 2021-02-25 NOTE — CV Procedure (Signed)
TEE  Pt sedated by anesthesia with Propofol intravenously Throat numbed with Cetacaine spray Biteguard placed     TEE probe advanced intraorally and down into mid esophagus without difficulty   LA, LAA without masses     PFO present (moderate).  There was a suggestion by color doppler and after injection of agitated saline and cough,  a large number of bubbles appeared in LA    TV normal  Trace TR AV normal  No AI MV normal  TRace MR PV normal  No PI  LVEF and RVEF are normal  Aorta with minimal plaquing   Procedure was without complications.  Full report to follow    Dorris Carnes MD

## 2021-02-25 NOTE — Anesthesia Postprocedure Evaluation (Signed)
Anesthesia Post Note  Patient: Erin [name]  Procedure(s) Performed: TRANSESOPHAGEAL ECHOCARDIOGRAM (TEE) BUBBLE STUDY     Patient location during evaluation: Endoscopy Anesthesia Type: MAC Level of consciousness: awake and alert Pain management: pain level controlled Vital Signs Assessment: post-procedure vital signs reviewed and stable Respiratory status: spontaneous breathing, nonlabored ventilation, respiratory function stable and patient connected to nasal cannula oxygen Cardiovascular status: blood pressure returned to baseline and stable Postop Assessment: no apparent nausea or vomiting Anesthetic complications: no   No notable events documented.  Last Vitals:  Vitals:   02/25/21 0857 02/25/21 1010  BP: (!) 149/94 124/68  Pulse: 95 97  Resp: 14 19  Temp: 36.9 C 36.7 C  SpO2: 100% 97%    Last Pain:  Vitals:   02/25/21 1020  TempSrc:   PainSc: 0-No pain                 Lyam Provencio L Shields Pautz

## 2021-02-26 ENCOUNTER — Encounter (HOSPITAL_COMMUNITY): Payer: Self-pay | Admitting: Cardiology

## 2021-02-26 DIAGNOSIS — G459 Transient cerebral ischemic attack, unspecified: Secondary | ICD-10-CM | POA: Diagnosis not present

## 2021-02-26 LAB — BASIC METABOLIC PANEL
Anion gap: 8 (ref 5–15)
BUN: 22 mg/dL (ref 8–23)
CO2: 24 mmol/L (ref 22–32)
Calcium: 9 mg/dL (ref 8.9–10.3)
Chloride: 105 mmol/L (ref 98–111)
Creatinine, Ser: 1.6 mg/dL — ABNORMAL HIGH (ref 0.44–1.00)
GFR, Estimated: 35 mL/min — ABNORMAL LOW (ref >60)
Glucose, Bld: 276 mg/dL — ABNORMAL HIGH (ref 70–99)
Potassium: 4 mmol/L (ref 3.5–5.1)
Sodium: 137 mmol/L (ref 135–145)

## 2021-02-26 LAB — GLUCOSE, CAPILLARY
Glucose-Capillary: 277 mg/dL — ABNORMAL HIGH (ref 70–99)
Glucose-Capillary: 359 mg/dL — ABNORMAL HIGH (ref 70–99)

## 2021-02-26 MED ORDER — TICAGRELOR 90 MG PO TABS: 90.0000 mg | ORAL_TABLET | Freq: Two times a day (BID) | ORAL | 0 refills | Status: AC

## 2021-02-26 MED ORDER — ATORVASTATIN CALCIUM 80 MG PO TABS: 80.0000 mg | ORAL_TABLET | Freq: Every day | ORAL | 0 refills | Status: AC

## 2021-02-26 MED ORDER — ASPIRIN 81 MG PO TBEC: 81.0000 mg | DELAYED_RELEASE_TABLET | Freq: Every day | ORAL | 11 refills | Status: AC

## 2021-02-26 NOTE — Discharge Summary (Addendum)
Name: Erin [name] MRN: [id number] DOB: Jan 26, 1956 65 y.o. PCP: [facility] [place]  Date of Admission: 02/23/2021  1:10 PM Date of Discharge: 02/26/21 Attending Physician: Sid Falcon, MD  Discharge Diagnosis: 1. TIA 2. Hypertension 3. DMII  Discharge Medications: Allergies as of 02/26/2021       Reactions   Amitriptyline Other (See Comments)   Vision change.  Other reaction(s): Other Vision change.    Hydrochlorothiazide    REACTION: cause,weird feelings Other reaction(s): Other (See Comments) REACTION: cause,weird feelings REACTION: cause,weird feelings   Liraglutide Nausea And Vomiting   REACTION: N/V REACTION: N/V   Metformin Swelling   REACTION: swelling REACTION: swelling   Nortriptyline Other (See Comments)   Headache Other reaction(s): Other (See Comments) Headache Headache   Prednisone    Very high sugars 600 and above.  Other reaction(s): Other (See Comments) Very high sugars 600 and above.  Very high sugars 600 and above.    Tramadol Nausea And Vomiting   Other reaction(s): Nausea And Vomiting   Farxiga [dapagliflozin]    Increased kidney function        Medication List     STOP taking these medications    aspirin 325 MG tablet Replaced by: aspirin 81 MG EC tablet   simvastatin 80 MG tablet Commonly known as: ZOCOR Replaced by: atorvastatin 80 MG tablet       TAKE these medications    ALPRAZolam 0.5 MG tablet Commonly known as: XANAX Take 1 tablet (0.5 mg total) by mouth 2 (two) times daily as needed for anxiety.   AMBULATORY NON FORMULARY MEDICATION Accucheck Aviva Plus meter and test strips Please check glucose 3 times daily. Dx: E11.8   amLODipine 10 MG tablet Commonly known as: NORVASC Take 10 mg by mouth daily.   aspirin 81 MG EC tablet Take 1 tablet (81 mg total) by mouth daily. Swallow whole. Start taking on: February 27, 2021 Replaces: aspirin 325 MG tablet   atenolol 50 MG tablet Commonly known as:  TENORMIN Take 1 tablet (50 mg total) by mouth daily.   atorvastatin 80 MG tablet Commonly known as: LIPITOR Take 1 tablet (80 mg total) by mouth daily. Start taking on: February 27, 2021 Replaces: simvastatin 80 MG tablet   hydrALAZINE 25 MG tablet Commonly known as: APRESOLINE Take 25 mg by mouth every 8 (eight) hours.   NEEDLE (DISP) 30 G 30G X 1/2" Misc Commonly known as: BD Disp Needles Inject into skin twice daily before meals.   NovoLIN R FlexPen ReliOn 100 UNIT/ML Sopn Generic drug: Insulin Regular Human Inject 15 Units as directed 3 (three) times daily with meals.   omeprazole 40 MG capsule Commonly known as: PRILOSEC Take 40 mg by mouth daily.   pregabalin 200 MG capsule Commonly known as: LYRICA Take 200 mg by mouth 3 (three) times daily.   Syringe (Disposable) 1 ML Misc Inject into skin twice daily before meals.   ticagrelor 90 MG Tabs tablet Commonly known as: BRILINTA Take 1 tablet (90 mg total) by mouth 2 (two) times daily.   Trulicity 3 0000000 Sopn Generic drug: Dulaglutide Inject 3 mg into the skin once a week. Sun- or Monday   vitamin B-12 500 MCG tablet Commonly known as: CYANOCOBALAMIN Take 500 mcg by mouth daily.       ASK your doctor about these medications    insulin NPH Human 100 UNIT/ML injection Commonly known as: NovoLIN N Inject 0.65 mLs (65 Units total) into the skin 2 (two) times  daily before a meal.        Disposition and follow-up:   Erin [name] was discharged from [facility] in Good condition. At the hospital follow up visit please address:  1.  TIA: Patient presented with left-sided weakness and facial droop. Concern for TIA due to negative imaging and resolution of her initial symptoms (she did have new word-finding difficulty on day 2 of her stay, but this also resolved, and subsequent work-up was negative). She was restarted on dual antiplatelet therapy (aspirin and brillinta at discharge). She  will continue this for 21 days followed by aspirin monotherapy. She was also started on atorvastatin 80 mg for secondary stroke prevention. Pt also has a loop recorder in place to monitor for Afib. She will follow-up with Neurology in the outpatient setting.  2. Hypertension: Patient is on atenolol 50 mg and hydralazine 25 mg 3 times daily at home. This was held during her hospital stay to allow for permissive hypertension. These will be resumed tomorrow after discharge.  Labs / imaging needed at time of follow-up: None  Pending labs/ test needing follow-up: None  Follow-up Appointments:  Follow-up [place]. Follow up.   Why: The rehab will contact you for the first appointment Contact information: [address] [facility] [address] [place] [address] [phone]         [facility] Follow up.   Why: on 8/3 at 320 pm for post loop recorder wound check Contact information: [place] [phone] [place] by problem list: 1. TIA  Patient presented with left-sided weakness and facial droop. She has multiple stroke risk factors and recently had a stroke in May 2022. CT head was negative for acute intracranial abnormality. MRA head/neck showed no large vessel occlusion. MRI showed chronic small vessel ischemic disease but no acute intracranial abnormality. TTE with unrevealing findings; EF was 55-60%. She was restarted on dual antiplatelet therapy and lipitor 80 mg. Her antihypertensive medications were held for 24-48 hours to allow for permissive hypertension. On day 2 of her hospitalization, she had some word-finding difficulties. Thus, we contacted Neuro to evaluate the patient. They recommended an EEG (found to be unremarkable) and contacted Cardiology for concern that there was an embolic source  responsible for her presentation. She subsequently underwent a TEE which revealed a PFO, however she had a RoPE score of 2 indicating a low probability that her stroke is due to her PFO. Neuro also recommended bilateral LE ultrasounds which were normal. On the day of discharge, she was transitioned from aspirin and plavix to aspirin and Brillinta. She will continue this for 21 days followed by aspirin monotherapy. She also continued her atorvastatin 80 mg for secondary stroke prevention.    Elevated creatinine Creatinine on admission 1.86, BUN 25. Baseline creatinine unclear. Her creatinine improved during her hospital stay, down to 1.6 at discharge.   Hypertension Home medications include atenolol 50 mg and hydralazine 25 mg 3 times daily. These were held during her hospital stay to allow for permissive hypertension.   DM During her admission, she was placed on SSI. She was transitioned to her home regimen after she was discharged.   Patient's home medications were continued for her other chronic conditions.     Discharge Exam:   BP Marland Kitchen)  150/97 (BP Location: Right Arm)   Pulse 92   Temp 98.2 F (36.8 C) (Oral)   Resp 15   Ht '5\' 6"'$  (1.676 m)   Wt 91.8 kg   SpO2 98%   BMI 32.67 kg/m  Discharge exam:  General: NAD, nl appearance HE: Normocephalic, atraumatic , EOMI, Conjunctivae normal ENT: No congestion, no rhinorrhea, no exudate or erythema Cardiovascular: Normal rate, regular rhythm. No murmurs, rubs, or gallops Pulmonary: Effort normal, breath sounds normal. No wheezes, rales, or rhonchi Abdominal: soft, nontender, bowel sounds present Musculoskeletal: no swelling, deformity, injury, or tenderness in extremities Neuro: AAO x3, no focal deficits Skin: Warm, dry, no bruising, erythema, or rash  Pertinent Labs, Studies, and Procedures:  CBC Latest Ref Rng & Units 02/24/2021 02/23/2021 02/23/2021  WBC 4.0 - 10.5 K/uL 8.2 - 10.0  Hemoglobin 12.0 - 15.0 g/dL 10.5(L) 11.2(L) 11.4(L)   Hematocrit 36.0 - 46.0 % 33.8(L) 33.0(L) 34.8(L)  Platelets 150 - 400 K/uL 232 - 245     BMP Latest Ref Rng & Units 02/26/2021 02/25/2021 02/24/2021  Glucose 70 - 99 mg/dL 276(H) 285(H) 201(H)  BUN 8 - 23 mg/dL '22 22 22  '$ Creatinine 0.44 - 1.00 mg/dL 1.60(H) 1.57(H) 1.58(H)  Sodium 135 - 145 mmol/L 137 138 140  Potassium 3.5 - 5.1 mmol/L 4.0 4.3 4.1  Chloride 98 - 111 mmol/L 105 104 107  CO2 22 - 32 mmol/L '24 26 27  '$ Calcium 8.9 - 10.3 mg/dL 9.0 9.1 9.1    HbA1c: 8 LDL: 53  MR ANGIO HEAD WO CONTRAST  Result Date: 02/23/2021 CLINICAL DATA:  Acute neuro deficit, stroke suspected. Left-sided weakness. EXAM: MRI HEAD WITHOUT CONTRAST MRA HEAD WITHOUT CONTRAST MRA NECK WITHOUT CONTRAST TECHNIQUE: Multiplanar, multiecho pulse sequences of the brain and surrounding structures were obtained without intravenous contrast. Angiographic images of the Circle of Willis were obtained using MRA technique without intravenous contrast. Angiographic images of the neck were obtained using MRA technique without intravenous contrast. Carotid stenosis measurements (when applicable) are obtained utilizing NASCET criteria, using the distal internal carotid diameter as the denominator. COMPARISON:  Head CT 02/22/2021. Head and neck CTA 12/17/2020. Head MRI 12/12/2020. FINDINGS: MRI HEAD FINDINGS The study is mildly motion degraded. Brain: There is no evidence of an acute infarct, mass, or midline shift. Confluent T2 hyperintensities in the cerebral white matter bilaterally are unchanged from the prior MRI and are nonspecific but compatible with severe chronic small vessel ischemic disease. There is mild cerebral atrophy. Small bilateral subdural fluid collections over both cerebral convexities on the prior MRI of decreased in size with a residual 4 mm thick collection on the right and trace collection on the left. These are again noted to largely follow CSF in signal intensity, however there are some scattered foci of  susceptibility artifact over the convexities suggesting that these may reflect chronic subdural hematomas rather than simple hygromas. Vascular: Major intracranial vascular flow voids are preserved. Skull and upper cervical spine: Unremarkable bone marrow signal. Sinuses/Orbits: Unremarkable orbits. Small mucous retention cysts in the maxillary sinuses, right larger than left. Clear mastoid air cells. Other: None. MRA HEAD FINDINGS The study is mildly motion degraded. The intracranial vertebral arteries are widely patent to the basilar with the left being dominant. Patent PICA and SCA origins are seen bilaterally. Basilar artery is widely patent. Both PCAs are patent with mild irregularity but no flow limiting proximal stenosis. The internal carotid arteries are patent from skull base to carotid termini. Multifocal signal loss involving the petrous,  cavernous, and proximal supraclinoid segments bilaterally is favored to reflect artifact, without a significant stenosis demonstrated on the recent prior CTA. ACAs and MCAs are patent with mild irregularity but no evidence of a flow limiting proximal stenosis or proximal branch occlusion. No aneurysm is identified. MRA NECK FINDINGS Image quality is degraded by noncontrast technique, moderate motion, and wrap artifact. The aortic arch, arch vessel origins, and proximal aspects of the common carotid and vertebral arteries are not well evaluated. The mid and distal portions of the common carotid arteries as well as the cervical internal carotid arteries are patent without a definite flow limiting stenosis within limitations of motion. The vertebral arteries are patent with antegrade flow bilaterally with the left being moderately dominant. The V1 segments are not well evaluated, and there is artifactual signal loss in both V3 segments distally. No definite flow limiting stenosis is evident in the V2 or proximal V3 segments. Apparent mild narrowing of the proximal to mid  left V2 segment on reformats may be due to artifact based on source images. IMPRESSION: 1. No acute intracranial abnormality. 2. Severe chronic small vessel ischemic disease. 3. Small residual bilateral subdural fluid collections as above, decreased in size from 12/12/2020 and without mass effect. 4. Motion degraded head and neck MRAs.  No large vessel occlusion. Electronically Signed   By: Logan Bores M.D.   On: 02/23/2021 19:33   MR ANGIO NECK WO CONTRAST  Result Date: 02/23/2021 CLINICAL DATA:  Acute neuro deficit, stroke suspected. Left-sided weakness. EXAM: MRI HEAD WITHOUT CONTRAST MRA HEAD WITHOUT CONTRAST MRA NECK WITHOUT CONTRAST TECHNIQUE: Multiplanar, multiecho pulse sequences of the brain and surrounding structures were obtained without intravenous contrast. Angiographic images of the Circle of Willis were obtained using MRA technique without intravenous contrast. Angiographic images of the neck were obtained using MRA technique without intravenous contrast. Carotid stenosis measurements (when applicable) are obtained utilizing NASCET criteria, using the distal internal carotid diameter as the denominator. COMPARISON:  Head CT 02/22/2021. Head and neck CTA 12/17/2020. Head MRI 12/12/2020. FINDINGS: MRI HEAD FINDINGS The study is mildly motion degraded. Brain: There is no evidence of an acute infarct, mass, or midline shift. Confluent T2 hyperintensities in the cerebral white matter bilaterally are unchanged from the prior MRI and are nonspecific but compatible with severe chronic small vessel ischemic disease. There is mild cerebral atrophy. Small bilateral subdural fluid collections over both cerebral convexities on the prior MRI of decreased in size with a residual 4 mm thick collection on the right and trace collection on the left. These are again noted to largely follow CSF in signal intensity, however there are some scattered foci of susceptibility artifact over the convexities suggesting that  these may reflect chronic subdural hematomas rather than simple hygromas. Vascular: Major intracranial vascular flow voids are preserved. Skull and upper cervical spine: Unremarkable bone marrow signal. Sinuses/Orbits: Unremarkable orbits. Small mucous retention cysts in the maxillary sinuses, right larger than left. Clear mastoid air cells. Other: None. MRA HEAD FINDINGS The study is mildly motion degraded. The intracranial vertebral arteries are widely patent to the basilar with the left being dominant. Patent PICA and SCA origins are seen bilaterally. Basilar artery is widely patent. Both PCAs are patent with mild irregularity but no flow limiting proximal stenosis. The internal carotid arteries are patent from skull base to carotid termini. Multifocal signal loss involving the petrous, cavernous, and proximal supraclinoid segments bilaterally is favored to reflect artifact, without a significant stenosis demonstrated on the recent prior CTA. ACAs  and MCAs are patent with mild irregularity but no evidence of a flow limiting proximal stenosis or proximal branch occlusion. No aneurysm is identified. MRA NECK FINDINGS Image quality is degraded by noncontrast technique, moderate motion, and wrap artifact. The aortic arch, arch vessel origins, and proximal aspects of the common carotid and vertebral arteries are not well evaluated. The mid and distal portions of the common carotid arteries as well as the cervical internal carotid arteries are patent without a definite flow limiting stenosis within limitations of motion. The vertebral arteries are patent with antegrade flow bilaterally with the left being moderately dominant. The V1 segments are not well evaluated, and there is artifactual signal loss in both V3 segments distally. No definite flow limiting stenosis is evident in the V2 or proximal V3 segments. Apparent mild narrowing of the proximal to mid left V2 segment on reformats may be due to artifact based on  source images. IMPRESSION: 1. No acute intracranial abnormality. 2. Severe chronic small vessel ischemic disease. 3. Small residual bilateral subdural fluid collections as above, decreased in size from 12/12/2020 and without mass effect. 4. Motion degraded head and neck MRAs.  No large vessel occlusion. Electronically Signed   By: Logan Bores M.D.   On: 02/23/2021 19:33   MR BRAIN WO CONTRAST  Result Date: 02/23/2021 CLINICAL DATA:  Acute neuro deficit, stroke suspected. Left-sided weakness. EXAM: MRI HEAD WITHOUT CONTRAST MRA HEAD WITHOUT CONTRAST MRA NECK WITHOUT CONTRAST TECHNIQUE: Multiplanar, multiecho pulse sequences of the brain and surrounding structures were obtained without intravenous contrast. Angiographic images of the Circle of Willis were obtained using MRA technique without intravenous contrast. Angiographic images of the neck were obtained using MRA technique without intravenous contrast. Carotid stenosis measurements (when applicable) are obtained utilizing NASCET criteria, using the distal internal carotid diameter as the denominator. COMPARISON:  Head CT 02/22/2021. Head and neck CTA 12/17/2020. Head MRI 12/12/2020. FINDINGS: MRI HEAD FINDINGS The study is mildly motion degraded. Brain: There is no evidence of an acute infarct, mass, or midline shift. Confluent T2 hyperintensities in the cerebral white matter bilaterally are unchanged from the prior MRI and are nonspecific but compatible with severe chronic small vessel ischemic disease. There is mild cerebral atrophy. Small bilateral subdural fluid collections over both cerebral convexities on the prior MRI of decreased in size with a residual 4 mm thick collection on the right and trace collection on the left. These are again noted to largely follow CSF in signal intensity, however there are some scattered foci of susceptibility artifact over the convexities suggesting that these may reflect chronic subdural hematomas rather than simple  hygromas. Vascular: Major intracranial vascular flow voids are preserved. Skull and upper cervical spine: Unremarkable bone marrow signal. Sinuses/Orbits: Unremarkable orbits. Small mucous retention cysts in the maxillary sinuses, right larger than left. Clear mastoid air cells. Other: None. MRA HEAD FINDINGS The study is mildly motion degraded. The intracranial vertebral arteries are widely patent to the basilar with the left being dominant. Patent PICA and SCA origins are seen bilaterally. Basilar artery is widely patent. Both PCAs are patent with mild irregularity but no flow limiting proximal stenosis. The internal carotid arteries are patent from skull base to carotid termini. Multifocal signal loss involving the petrous, cavernous, and proximal supraclinoid segments bilaterally is favored to reflect artifact, without a significant stenosis demonstrated on the recent prior CTA. ACAs and MCAs are patent with mild irregularity but no evidence of a flow limiting proximal stenosis or proximal branch occlusion. No aneurysm is  identified. MRA NECK FINDINGS Image quality is degraded by noncontrast technique, moderate motion, and wrap artifact. The aortic arch, arch vessel origins, and proximal aspects of the common carotid and vertebral arteries are not well evaluated. The mid and distal portions of the common carotid arteries as well as the cervical internal carotid arteries are patent without a definite flow limiting stenosis within limitations of motion. The vertebral arteries are patent with antegrade flow bilaterally with the left being moderately dominant. The V1 segments are not well evaluated, and there is artifactual signal loss in both V3 segments distally. No definite flow limiting stenosis is evident in the V2 or proximal V3 segments. Apparent mild narrowing of the proximal to mid left V2 segment on reformats may be due to artifact based on source images. IMPRESSION: 1. No acute intracranial abnormality.  2. Severe chronic small vessel ischemic disease. 3. Small residual bilateral subdural fluid collections as above, decreased in size from 12/12/2020 and without mass effect. 4. Motion degraded head and neck MRAs.  No large vessel occlusion. Electronically Signed   By: Logan Bores M.D.   On: 02/23/2021 19:33   EEG adult  Result Date: 02/24/2021 Lora Havens, MD     02/24/2021  1:04 PM Patient Name: Erin [name] MRN: [id number] Epilepsy Attending: Lora Havens Referring Physician/Provider: [name], NP Date: 02/24/2021 Duration: 24.38 mins Patient history: 65 year old female with transient weakness, left-sided facial droop and speech disturbance.  EEG to evaluate for seizures. Level of alertness: Awake AEDs during EEG study: None Technical aspects: This EEG study was done with scalp electrodes positioned according to the 10-20 International system of electrode placement. Electrical activity was acquired at a sampling rate of '500Hz'$  and reviewed with a high frequency filter of '70Hz'$  and a low frequency filter of '1Hz'$ . EEG data were recorded continuously and digitally stored. Description: The posterior dominant rhythm consists of 9 Hz activity of moderate voltage (25-35 uV) seen predominantly in posterior head regions, symmetric and reactive to eye opening and eye closing. Hyperventilation and photic stimulation were not performed.   IMPRESSION: This study is within normal limits. No seizures or epileptiform discharges were seen throughout the recording. Lora Havens   ECHOCARDIOGRAM COMPLETE  Result Date: 02/23/2021    ECHOCARDIOGRAM REPORT   Patient Name:   Erin [name] Date of Exam: 02/23/2021 Medical Rec #:  [id number]         Height:       66.0 in Accession #:    [id number]        Weight:       202.4 lb Date of Birth:  Sep 04, 1955         BSA:          2.010 m Patient Age:    29 years          BP:           122/62 mmHg Patient Gender: F                 HR:           89 bpm. Exam  Location:  Inpatient Procedure: 2D Echo, Cardiac Doppler and Color Doppler Indications:    TIA  History:        Patient has no prior history of Echocardiogram examinations.                 Risk Factors:Obesity.  Sonographer:    Merrie Roof RDCS Referring Phys: [place]  1. Left ventricular ejection fraction, by estimation, is 55 to 60%. The left ventricle has normal function. The left ventricle has no regional wall motion abnormalities. Left ventricular diastolic parameters are consistent with Grade I diastolic dysfunction (impaired relaxation).  2. Right ventricular systolic function is normal. The right ventricular size is normal. Tricuspid regurgitation signal is inadequate for assessing PA pressure.  3. The mitral valve is normal in structure. No evidence of mitral valve regurgitation. No evidence of mitral stenosis.  4. The aortic valve is normal in structure. Aortic valve regurgitation is not visualized. No aortic stenosis is present.  5. The inferior vena cava is normal in size with greater than 50% respiratory variability, suggesting right atrial pressure of 3 mmHg. FINDINGS  Left Ventricle: Left ventricular ejection fraction, by estimation, is 55 to 60%. The left ventricle has normal function. The left ventricle has no regional wall motion abnormalities. The left ventricular internal cavity size was normal in size. There is  no left ventricular hypertrophy. Left ventricular diastolic parameters are consistent with Grade I diastolic dysfunction (impaired relaxation). Indeterminate filling pressures. Right Ventricle: The right ventricular size is normal. No increase in right ventricular wall thickness. Right ventricular systolic function is normal. Tricuspid regurgitation signal is inadequate for assessing PA pressure. Left Atrium: Left atrial size was normal in size. Right Atrium: Right atrial size was normal in size. Pericardium: There is no evidence of pericardial effusion.  Mitral Valve: The mitral valve is normal in structure. No evidence of mitral valve regurgitation. No evidence of mitral valve stenosis. Tricuspid Valve: The tricuspid valve is normal in structure. Tricuspid valve regurgitation is not demonstrated. No evidence of tricuspid stenosis. Aortic Valve: The aortic valve is normal in structure. Aortic valve regurgitation is not visualized. No aortic stenosis is present. Aortic valve mean gradient measures 3.0 mmHg. Aortic valve peak gradient measures 4.8 mmHg. Aortic valve area, by VTI measures 3.94 cm. Pulmonic Valve: The pulmonic valve was normal in structure. Pulmonic valve regurgitation is not visualized. No evidence of pulmonic stenosis. Aorta: The aortic root is normal in size and structure. Venous: The inferior vena cava is normal in size with greater than 50% respiratory variability, suggesting right atrial pressure of 3 mmHg. IAS/Shunts: No atrial level shunt detected by color flow Doppler.  LEFT VENTRICLE PLAX 2D LVIDd:         3.90 cm  Diastology LV PW:         1.00 cm  LV e' medial:    5.44 cm/s LV IVS:        1.10 cm  LV E/e' medial:  11.7 LVOT diam:     2.00 cm  LV e' lateral:   5.00 cm/s LV SV:         70       LV E/e' lateral: 12.7 LV SV Index:   35 LVOT Area:     3.14 cm  RIGHT VENTRICLE RV Basal diam:  2.80 cm LEFT ATRIUM           Index       RIGHT ATRIUM          Index LA Vol (A4C): 24.5 ml 12.19 ml/m RA Area:     9.58 cm                                   RA Volume:   19.50 ml 9.70 ml/m  AORTIC VALVE AV Area (Vmax):  3.52 cm AV Area (Vmean):   3.38 cm AV Area (VTI):     3.94 cm AV Vmax:           109.00 cm/s AV Vmean:          73.600 cm/s AV VTI:            0.177 m AV Peak Grad:      4.8 mmHg AV Mean Grad:      3.0 mmHg LVOT Vmax:         122.00 cm/s LVOT Vmean:        79.100 cm/s LVOT VTI:          0.222 m LVOT/AV VTI ratio: 1.25  AORTA Ao Root diam: 3.30 cm Ao Asc diam:  3.30 cm MITRAL VALVE MV Area (PHT): 3.91 cm     SHUNTS MV Decel Time:  194 msec     Systemic VTI:  0.22 m MV E velocity: 63.40 cm/s   Systemic Diam: 2.00 cm MV A velocity: 114.00 cm/s MV E/A ratio:  0.56 Mihai Croitoru MD Electronically signed by Sanda Klein MD Signature Date/Time: 02/23/2021/5:41:08 PM    Final    CT HEAD CODE STROKE WO CONTRAST  Result Date: 02/23/2021 CLINICAL DATA:  Code stroke.  Neuro deficit, acute stroke suspected. EXAM: CT HEAD WITHOUT CONTRAST TECHNIQUE: Contiguous axial images were obtained from the base of the skull through the vertex without intravenous contrast. COMPARISON:  Dec 11, 2020. (Dec 17, 2020 CT head not retrievable at the time of dictation). FINDINGS: Brain: No evidence of acute infarction, hemorrhage, hydrocephalus, or mass lesion. 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 Moderate patchy white matter hypoattenuation, nonspecific but most likely related to chronic microvascular ischemic disease. Vascular: No hyperdense vessel identified. Calcific intracranial atherosclerosis. Skull: No acute fracture. Nonspecific lucent lesion in the right frontal calvarium, unchanged from prior. Sinuses/Orbits: Right maxillary sinus retention cyst. Other: No mastoid effusions. ASPECTS [facility] Stroke Program Early CT Score) Total score (0-10 with 10 being normal): 10. IMPRESSION: 1. No evidence of acute large vascular territory infarct or acute hemorrhage.ASPECTS is 10 2. Moderate chronic microvascular ischemic disease. An MRI could provide more sensitive evaluation for acute white matter infarct clinically indicated. 3. 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 Code stroke imaging results were communicated on 02/23/2021 at 1:25 pm to provider Dr. Quinn Axe Via secure text paging. Electronically Signed   By: Margaretha Sheffield MD   On: 02/23/2021 13:29     Discharge  Instructions: Discharge Instructions     Ambulatory referral to Neurology   Complete by: As directed    An appointment is requested in approximately: 4-8 weeks   Ambulatory referral to Occupational Therapy   Complete by: As directed    Ambulatory referral to Physical Therapy   Complete by: As directed    Call MD for:  difficulty breathing, headache or visual disturbances   Complete by: As directed    Call MD for:  persistant dizziness or light-headedness   Complete by: As directed        Signed: Orvis Brill, MD 02/26/2021, 1:05 PM   Pager: [phone]   Date: 03/01/2021  Patient name: Erin [name]  Medical record number: [id number]  Date of birth: 1956-03-22        I have seen and evaluated this patient  and I have discussed the plan of care with the house staff. Please see Dr. Evette Georges note for complete details. I concur with her findings and plan for discharge.   Sid Falcon, MD 03/01/2021, 1:29 PM

## 2021-02-26 NOTE — Plan of Care (Signed)
  Problem: Education: Goal: Knowledge of disease or condition will improve Outcome: Progressing Goal: Knowledge of secondary prevention will improve Outcome: Progressing Goal: Knowledge of patient specific risk factors addressed and post discharge goals established will improve Outcome: Progressing   Problem: Coping: Goal: Will verbalize positive feelings about self Outcome: Progressing Goal: Will identify appropriate support needs Outcome: Progressing   Problem: Health Behavior/Discharge Planning: Goal: Ability to manage health-related needs will improve Outcome: Progressing   Problem: Self-Care: Goal: Ability to participate in self-care as condition permits will improve Outcome: Progressing Goal: Verbalization of feelings and concerns over difficulty with self-care will improve Outcome: Progressing

## 2021-02-26 NOTE — TOC Transition Note (Signed)
Transition of Care [facility]) - CM/SW Discharge Note   Patient Details  Name: ZENAYAH CICCARELLO MRN: [id number] Date of Birth: 1955-11-12  [facility]) CM/SW Contact:  Pollie Friar, RN Phone Number: 02/26/2021, 1:15 PM   Clinical Narrative:    Patient discharging home with outpatient therapy through [facility]. Information on the AVS.  Pt has transportation home.   Final next level of care: OP Rehab Barriers to Discharge: No Barriers Identified   Patient Goals and CMS Choice     Choice offered to / list presented to : Patient  Discharge Placement                       Discharge Plan and Services   Discharge Planning Services: CM Consult                                 Social Determinants of Health (SDOH) Interventions     Readmission Risk Interventions No flowsheet data found.

## 2021-02-26 NOTE — Progress Notes (Addendum)
Inpatient Diabetes Program Recommendations  AACE/ADA: New Consensus Statement on Inpatient Glycemic Control (2015)  Target Ranges:  Prepandial:   less than 140 mg/dL      Peak postprandial:   less than 180 mg/dL (1-2 hours)      Critically ill patients:  140 - 180 mg/dL   Lab Results  Component Value Date   GLUCAP 359 (H) 02/26/2021   HGBA1C 8.0 (H) 02/24/2021    Review of Glycemic Control  Diabetes history: type 2 Outpatient Diabetes medications: Novolin [employer] Relion NPH insulin 100 units BID, Novolin Regular insulin 35 units TID (insulin pens),Trulicity 3 mg weekly Current orders for Inpatient glycemic control: Lantus 30 units daily, Novolog 0-20 units correction scale TID  Inpatient Diabetes Program Recommendations:   Spoke with patient on the phone about her diabetes. Verified her home doses of insulin : [employer] Relion Novolin 100 units BID, Novolin Regular 35 units TID. (Using insulin pens) States that these dosages were changed recently about 1 month ago by a new doctor at [facility] that she did have 2 incidents of low blood sugar after the change in dosage. She stated that she had not eaten as much that day. States that she does not check her blood sugars every day.  Encouraged her to follow up with her physician after dishcarge. Discussed her current dosages in the hospital. Patient asked if she was taking too much insulin at home. Encouraged her to talk with her physician here about dosages of insulin to go home on.   Will include Exit Notes on hypoglycemia, blood glucose monitoring in her discharge information.   Harvel Ricks RN BSN CDE Diabetes Coordinator Pager: [phone]  8am-5pm

## 2021-02-26 NOTE — Progress Notes (Signed)
Pt wheeled off unit by NT. All of patients belongings were in the car as her daughter gathered her belongings prior to the patient leaving the floor.

## 2021-02-26 NOTE — Progress Notes (Signed)
Physical Therapy Treatment Patient Details Name: Erin [name] MRN: [id number] DOB: 07-03-1956 Today's Date: 02/26/2021    History of Present Illness Pt is a 65 y/o female admitted 7/19 secondary to bilateral LE weakness and L facial droop. MRI negative. PMH includes CVA, DM, tobacco use, renal cell cancer, and HTN.    PT Comments    Patient continues to require minA at times during ambulation with dynamic activities or open environment. Patient negotiated 4 stairs with minA and no hand rails. Educated patient on benefit of use of SPC for mobility for support and stability, patient verbalized understanding but will consider use once she is home. Continue to recommend OPPT following discharge to address strength and balance deficits.     Follow Up Recommendations  Outpatient PT     Equipment Recommendations  Cane    Recommendations for Other Services       Precautions / Restrictions Precautions Precautions: Fall Restrictions Weight Bearing Restrictions: No    Mobility  Bed Mobility Overal bed mobility: Modified Independent                  Transfers Overall transfer level: Needs assistance Equipment used: None Transfers: Sit to/from Stand Sit to Stand: Supervision         General transfer comment: supervision for safety  Ambulation/Gait Ambulation/Gait assistance: Min guard;Min assist Gait Distance (Feet): 200 Feet Assistive device: None Gait Pattern/deviations: Step-through pattern;Decreased stride length;Staggering right Gait velocity: Decreased   General Gait Details: slow guarded gait. Stumbling initially in room with minA to recover. Patient with difficulty turning and staggering to R requiring minA to recover. Encouraged use of cane if patient unwilling to try RW for mobility   Stairs Stairs: Yes Stairs assistance: Min assist Stair Management: No rails;Step to pattern;Forwards Number of Stairs: 4 General stair comments: minA for balance as  patient unsteady on both ascent and descent   Wheelchair Mobility    Modified Rankin (Stroke Patients Only)       Balance Overall balance assessment: Needs assistance Sitting-balance support: No upper extremity supported;Feet supported Sitting balance-Leahy Scale: Good     Standing balance support: No upper extremity supported;During functional activity Standing balance-Leahy Scale: Fair Standing balance comment: minA for balance during dynamic activities                            Cognition Arousal/Alertness: Awake/alert Behavior During Therapy: WFL for tasks assessed/performed Overall Cognitive Status: No family/caregiver present to determine baseline cognitive functioning                                 General Comments: memory deficits at baseline. Following commands appropriately but decreased safety awareness and awareness of need for supervision/assistance for safety      Exercises      General Comments        Pertinent Vitals/Pain Pain Assessment: No/denies pain    Home Living                      Prior Function            PT Goals (current goals can now be found in the care plan section) Acute Rehab PT Goals Patient Stated Goal: to go home PT Goal Formulation: With patient Time For Goal Achievement: 03/10/21 Potential to Achieve Goals: Good Progress towards PT goals: Progressing toward goals    Frequency  Min 3X/week      PT Plan Current plan remains appropriate    Co-evaluation              AM-PAC PT "6 Clicks" Mobility   Outcome Measure  Help needed turning from your back to your side while in a flat bed without using bedrails?: None Help needed moving from lying on your back to sitting on the side of a flat bed without using bedrails?: None Help needed moving to and from a bed to a chair (including a wheelchair)?: A Little Help needed standing up from a chair using your arms (e.g., wheelchair  or bedside chair)?: A Little Help needed to walk in hospital room?: A Little Help needed climbing 3-5 steps with a railing? : A Little 6 Click Score: 20    End of Session Equipment Utilized During Treatment: Gait belt Activity Tolerance: Patient tolerated treatment well Patient left: in bed;with call bell/phone within reach;with bed alarm set Nurse Communication: Mobility status PT Visit Diagnosis: Unsteadiness on feet (R26.81)     Time: [id number] PT Time Calculation (min) (ACUTE ONLY): 24 min  Charges:  $Gait Training: 23-37 mins                     Erin [name] PT, DPT Acute Rehabilitation Services Pager [phone] Office [phone]    [name] 02/26/2021, 3:10 PM

## 2021-03-10 ENCOUNTER — Ambulatory Visit: Payer: Medicare Other

## 2021-04-06 ENCOUNTER — Ambulatory Visit (INDEPENDENT_AMBULATORY_CARE_PROVIDER_SITE_OTHER): Payer: Medicare Other

## 2021-04-06 DIAGNOSIS — G459 Transient cerebral ischemic attack, unspecified: Secondary | ICD-10-CM | POA: Diagnosis not present

## 2021-04-06 LAB — CUP PACEART REMOTE DEVICE CHECK
Date Time Interrogation Session: 20220830112204
Implantable Pulse Generator Implant Date: 20220721

## 2021-04-19 NOTE — Progress Notes (Signed)
[facility] Summary Report / Loop Recorder 

## 2021-05-10 ENCOUNTER — Ambulatory Visit (INDEPENDENT_AMBULATORY_CARE_PROVIDER_SITE_OTHER): Payer: Medicare Other

## 2021-05-10 DIAGNOSIS — G459 Transient cerebral ischemic attack, unspecified: Secondary | ICD-10-CM

## 2021-05-10 LAB — CUP PACEART REMOTE DEVICE CHECK
Date Time Interrogation Session: 20221002112133
Implantable Pulse Generator Implant Date: 20220721

## 2021-05-18 NOTE — Progress Notes (Signed)
[facility] Summary Report / Loop Recorder 

## 2021-06-13 LAB — CUP PACEART REMOTE DEVICE CHECK
Date Time Interrogation Session: 20221104112142
Implantable Pulse Generator Implant Date: 20220721

## 2021-06-14 ENCOUNTER — Ambulatory Visit (INDEPENDENT_AMBULATORY_CARE_PROVIDER_SITE_OTHER): Payer: Medicare Other

## 2021-06-14 DIAGNOSIS — G459 Transient cerebral ischemic attack, unspecified: Secondary | ICD-10-CM

## 2021-06-21 NOTE — Progress Notes (Signed)
[facility] Summary Report / Loop Recorder 

## 2021-07-14 LAB — CUP PACEART REMOTE DEVICE CHECK
Date Time Interrogation Session: 20221207112036
Implantable Pulse Generator Implant Date: 20220721

## 2021-07-19 ENCOUNTER — Ambulatory Visit (INDEPENDENT_AMBULATORY_CARE_PROVIDER_SITE_OTHER): Payer: Medicare Other

## 2021-07-19 DIAGNOSIS — G459 Transient cerebral ischemic attack, unspecified: Secondary | ICD-10-CM | POA: Diagnosis not present

## 2021-07-28 NOTE — Progress Notes (Signed)
[facility] Summary Report / Loop Recorder 

## 2021-08-23 ENCOUNTER — Ambulatory Visit (INDEPENDENT_AMBULATORY_CARE_PROVIDER_SITE_OTHER): Payer: Medicare Other

## 2021-08-23 DIAGNOSIS — G459 Transient cerebral ischemic attack, unspecified: Secondary | ICD-10-CM | POA: Diagnosis not present

## 2021-08-24 LAB — CUP PACEART REMOTE DEVICE CHECK
Date Time Interrogation Session: 20230115231256
Implantable Pulse Generator Implant Date: 20220721

## 2021-09-03 NOTE — Progress Notes (Signed)
[facility] Summary Report / Loop Recorder 

## 2021-10-25 ENCOUNTER — Emergency Department (HOSPITAL_COMMUNITY): Payer: Medicare Other

## 2021-10-25 ENCOUNTER — Other Ambulatory Visit: Payer: Self-pay

## 2021-10-25 ENCOUNTER — Emergency Department (HOSPITAL_COMMUNITY)
Admission: EM | Admit: 2021-10-25 | Discharge: 2021-10-25 | Disposition: A | Discharge status: Home / Self Care | Payer: Medicare Other | Attending: Emergency Medicine | Admitting: Emergency Medicine

## 2021-10-25 ENCOUNTER — Encounter (HOSPITAL_COMMUNITY): Payer: Self-pay | Admitting: Pharmacy Technician

## 2021-10-25 DIAGNOSIS — Z7901 Long term (current) use of anticoagulants: Secondary | ICD-10-CM | POA: Insufficient documentation

## 2021-10-25 DIAGNOSIS — R4781 Slurred speech: Secondary | ICD-10-CM | POA: Insufficient documentation

## 2021-10-25 DIAGNOSIS — Z5321 Procedure and treatment not carried out due to patient leaving prior to being seen by health care provider: Secondary | ICD-10-CM | POA: Diagnosis not present

## 2021-10-25 DIAGNOSIS — R29898 Other symptoms and signs involving the musculoskeletal system: Secondary | ICD-10-CM | POA: Diagnosis not present

## 2021-10-25 DIAGNOSIS — R42 Dizziness and giddiness: Secondary | ICD-10-CM | POA: Diagnosis present

## 2021-10-25 LAB — COMPREHENSIVE METABOLIC PANEL
ALT: 27 U/L (ref 0–44)
AST: 26 U/L (ref 15–41)
Albumin: 3.5 g/dL (ref 3.5–5.0)
Alkaline Phosphatase: 63 U/L (ref 38–126)
Anion gap: 9 (ref 5–15)
BUN: 30 mg/dL — ABNORMAL HIGH (ref 8–23)
CO2: 21 mmol/L — ABNORMAL LOW (ref 22–32)
Calcium: 9.4 mg/dL (ref 8.9–10.3)
Chloride: 108 mmol/L (ref 98–111)
Creatinine, Ser: 1.72 mg/dL — ABNORMAL HIGH (ref 0.44–1.00)
GFR, Estimated: 32 mL/min — ABNORMAL LOW (ref >60)
Glucose, Bld: 96 mg/dL (ref 70–99)
Potassium: 4.2 mmol/L (ref 3.5–5.1)
Sodium: 138 mmol/L (ref 135–145)
Total Bilirubin: 0.5 mg/dL (ref 0.3–1.2)
Total Protein: 7.4 g/dL (ref 6.5–8.1)

## 2021-10-25 LAB — DIFFERENTIAL
Abs Immature Granulocytes: 0.03 10*3/uL (ref 0.00–0.07)
Basophils Absolute: 0.1 10*3/uL (ref 0.0–0.1)
Basophils Relative: 1 %
Eosinophils Absolute: 0.2 10*3/uL (ref 0.0–0.5)
Eosinophils Relative: 3 %
Immature Granulocytes: 0 %
Lymphocytes Relative: 25 %
Lymphs Abs: 2.1 10*3/uL (ref 0.7–4.0)
Monocytes Absolute: 0.5 10*3/uL (ref 0.1–1.0)
Monocytes Relative: 6 %
Neutro Abs: 5.4 10*3/uL (ref 1.7–7.7)
Neutrophils Relative %: 65 %

## 2021-10-25 LAB — I-STAT CHEM 8, ED
BUN: 32 mg/dL — ABNORMAL HIGH (ref 8–23)
Calcium, Ion: 1.23 mmol/L (ref 1.15–1.40)
Chloride: 109 mmol/L (ref 98–111)
Creatinine, Ser: 1.8 mg/dL — ABNORMAL HIGH (ref 0.44–1.00)
Glucose, Bld: 91 mg/dL (ref 70–99)
HCT: 40 % (ref 36.0–46.0)
Hemoglobin: 13.6 g/dL (ref 12.0–15.0)
Potassium: 4.2 mmol/L (ref 3.5–5.1)
Sodium: 141 mmol/L (ref 135–145)
TCO2: 24 mmol/L (ref 22–32)

## 2021-10-25 LAB — CBC
HCT: 40.9 % (ref 36.0–46.0)
Hemoglobin: 13.7 g/dL (ref 12.0–15.0)
MCH: 27.9 pg (ref 26.0–34.0)
MCHC: 33.5 g/dL (ref 30.0–36.0)
MCV: 83.3 fL (ref 80.0–100.0)
Platelets: 264 10*3/uL (ref 150–400)
RBC: 4.91 MIL/uL (ref 3.87–5.11)
RDW: 15.4 % (ref 11.5–15.5)
WBC: 8.3 10*3/uL (ref 4.0–10.5)
nRBC: 0 % (ref 0.0–0.2)

## 2021-10-25 LAB — CBG MONITORING, ED: Glucose-Capillary: 93 mg/dL (ref 70–99)

## 2021-10-25 LAB — APTT: aPTT: 27 seconds (ref 24–36)

## 2021-10-25 LAB — PROTIME-INR
INR: 1.1 (ref 0.8–1.2)
Prothrombin Time: 13.9 seconds (ref 11.4–15.2)

## 2021-10-25 MED ORDER — SODIUM CHLORIDE 0.9% FLUSH: 3.0000 mL | Freq: Once | INTRAVENOUS | Status: DC

## 2021-10-25 NOTE — ED Triage Notes (Signed)
Pt here with reports of "wobbly legs" and slurred speech X3 days. Pt with hx of CVA.  ?

## 2021-10-25 NOTE — ED Notes (Signed)
Pt called for vitals and role call. No answer.  ?

## 2021-10-25 NOTE — ED Provider Triage Note (Signed)
Emergency Medicine Provider Triage Evaluation Note ? ?Erin [name] , a 66 y.o. female  was evaluated in triage.  Pt complains of wobbly legs and slurred speech x3 days.  Patient has a past medical history of CVA.  Patient currently takes anticoagulant. Has associated lightheadedness, shortness of breath.  Patient notes that she has been followed by neurology after her CVA.  She denies any residual weakness and notes that she completed physical therapy following her CVA.  Patient notes that her daughter noted that she appeared to be struggling to get her words out prior to arrival to the ED.  Patient's daughter is not in the room during initial evaluation.  Denies past medical history of asthma, COPD, CHF. Denies chest pain, shortness of breath, dizziness, lightheadedness, abdominal pain, nausea, vomiting. ? ?Review of Systems  ?Positive: As per HPI above ?Negative: ? ?Physical Exam  ?BP 130/87 (BP Location: Right Arm)   Pulse 87   Resp 18   Ht '5\' 6"'$  (1.676 m)   Wt 85.7 kg   SpO2 98%   BMI 30.51 kg/m?  ?Gen:   Awake, no distress  ?Resp:  Normal effort  ?MSK:   Moves extremities without difficulty  ?Other:  No focal neurological deficits.  Negative pronator drift.  Alert and oriented x3. ? ?Medical Decision Making  ?Medically screening exam initiated at 4:30 PM.  Appropriate orders placed.  AMORAH SEBRING was informed that the remainder of the evaluation will be completed by another provider, this initial triage assessment does not replace that evaluation, and the importance of remaining in the ED until their evaluation is complete. ? ?4:40 PM - Discussed with RN that patient is in need of a room. RN aware and working on room placement.  ?  ?Mcgregor Tinnon A, PA-C ?10/25/21 1640 ? ?

## 2022-02-24 ENCOUNTER — Telehealth: Payer: Self-pay

## 2022-02-24 NOTE — Telephone Encounter (Signed)
The patient did not know how to get to her [facility] app. I ordered the patient a Relay monitor. She should receive it in 7-10 business days. I updated her address as well.

## 2022-03-17 LAB — CUP PACEART REMOTE DEVICE CHECK
Date Time Interrogation Session: 20230805230835
Implantable Pulse Generator Implant Date: 20220721

## 2022-04-25 ENCOUNTER — Ambulatory Visit (INDEPENDENT_AMBULATORY_CARE_PROVIDER_SITE_OTHER): Payer: Medicare Other

## 2022-04-25 DIAGNOSIS — G459 Transient cerebral ischemic attack, unspecified: Secondary | ICD-10-CM | POA: Diagnosis not present

## 2022-04-26 LAB — CUP PACEART REMOTE DEVICE CHECK
Date Time Interrogation Session: 20230917231455
Implantable Pulse Generator Implant Date: 20220721

## 2022-05-07 NOTE — Progress Notes (Signed)
[facility] Summary Report / Loop Recorder 

## 2022-05-30 ENCOUNTER — Ambulatory Visit (INDEPENDENT_AMBULATORY_CARE_PROVIDER_SITE_OTHER): Payer: 59

## 2022-05-30 DIAGNOSIS — G459 Transient cerebral ischemic attack, unspecified: Secondary | ICD-10-CM | POA: Diagnosis not present

## 2022-05-31 LAB — CUP PACEART REMOTE DEVICE CHECK
Date Time Interrogation Session: 20231020230455
Implantable Pulse Generator Implant Date: 20220721

## 2022-06-22 NOTE — Progress Notes (Signed)
[facility] Summary Report / Loop Recorder 

## 2022-06-29 ENCOUNTER — Ambulatory Visit (INDEPENDENT_AMBULATORY_CARE_PROVIDER_SITE_OTHER): Payer: 59

## 2022-06-29 DIAGNOSIS — G459 Transient cerebral ischemic attack, unspecified: Secondary | ICD-10-CM

## 2022-07-01 LAB — CUP PACEART REMOTE DEVICE CHECK
Date Time Interrogation Session: 20231122230540
Implantable Pulse Generator Implant Date: 20220721

## 2022-07-20 NOTE — Progress Notes (Signed)
[facility] Summary Report / Loop Recorder 

## 2022-08-02 ENCOUNTER — Ambulatory Visit (INDEPENDENT_AMBULATORY_CARE_PROVIDER_SITE_OTHER): Payer: 59

## 2022-08-02 DIAGNOSIS — G459 Transient cerebral ischemic attack, unspecified: Secondary | ICD-10-CM

## 2022-08-02 LAB — CUP PACEART REMOTE DEVICE CHECK
Date Time Interrogation Session: 20231225231233
Implantable Pulse Generator Implant Date: 20220721

## 2022-08-25 NOTE — Progress Notes (Signed)
[facility] Summary Report / Loop Recorder

## 2022-09-05 ENCOUNTER — Ambulatory Visit: Payer: 59

## 2022-09-05 DIAGNOSIS — G459 Transient cerebral ischemic attack, unspecified: Secondary | ICD-10-CM | POA: Diagnosis not present

## 2022-09-06 LAB — CUP PACEART REMOTE DEVICE CHECK
Date Time Interrogation Session: 20240127230503
Implantable Pulse Generator Implant Date: 20220721

## 2022-10-10 ENCOUNTER — Ambulatory Visit (INDEPENDENT_AMBULATORY_CARE_PROVIDER_SITE_OTHER): Payer: 59

## 2022-10-10 DIAGNOSIS — G459 Transient cerebral ischemic attack, unspecified: Secondary | ICD-10-CM | POA: Diagnosis not present

## 2022-10-10 LAB — CUP PACEART REMOTE DEVICE CHECK
Date Time Interrogation Session: 20240303231650
Implantable Pulse Generator Implant Date: 20220721

## 2022-10-19 NOTE — Progress Notes (Signed)
[facility] Summary Report / Loop Recorder 

## 2022-11-14 ENCOUNTER — Ambulatory Visit (INDEPENDENT_AMBULATORY_CARE_PROVIDER_SITE_OTHER): Payer: 59

## 2022-11-14 DIAGNOSIS — G459 Transient cerebral ischemic attack, unspecified: Secondary | ICD-10-CM | POA: Diagnosis not present

## 2022-11-14 LAB — CUP PACEART REMOTE DEVICE CHECK
Date Time Interrogation Session: 20240405231002
Implantable Pulse Generator Implant Date: 20220721

## 2022-11-15 NOTE — Progress Notes (Signed)
[facility] Summary Report / Loop Recorder 

## 2022-12-15 LAB — CUP PACEART REMOTE DEVICE CHECK
Date Time Interrogation Session: 20240508231220
Implantable Pulse Generator Implant Date: 20220721

## 2022-12-19 ENCOUNTER — Ambulatory Visit (INDEPENDENT_AMBULATORY_CARE_PROVIDER_SITE_OTHER): Payer: 59

## 2022-12-19 DIAGNOSIS — G459 Transient cerebral ischemic attack, unspecified: Secondary | ICD-10-CM | POA: Diagnosis not present

## 2022-12-21 NOTE — Progress Notes (Signed)
[facility] Summary Report / Loop Recorder 

## 2023-01-12 NOTE — Progress Notes (Signed)
[facility] Summary Report / Loop Recorder 

## 2023-01-18 LAB — CUP PACEART REMOTE DEVICE CHECK
Date Time Interrogation Session: 20240610230550
Implantable Pulse Generator Implant Date: 20220721

## 2023-01-23 ENCOUNTER — Ambulatory Visit (INDEPENDENT_AMBULATORY_CARE_PROVIDER_SITE_OTHER): Payer: 59

## 2023-01-23 DIAGNOSIS — G459 Transient cerebral ischemic attack, unspecified: Secondary | ICD-10-CM

## 2023-02-13 NOTE — Progress Notes (Signed)
[facility] Summary Report / Loop Recorder 

## 2023-02-20 ENCOUNTER — Ambulatory Visit: Payer: 59

## 2023-02-20 DIAGNOSIS — G459 Transient cerebral ischemic attack, unspecified: Secondary | ICD-10-CM

## 2023-02-21 LAB — CUP PACEART REMOTE DEVICE CHECK
Date Time Interrogation Session: 20240713230705
Implantable Pulse Generator Implant Date: 20220721

## 2023-02-27 ENCOUNTER — Ambulatory Visit: Payer: 59

## 2023-03-06 NOTE — Progress Notes (Signed)
[facility] Summary Report / Loop Recorder 

## 2023-03-27 ENCOUNTER — Ambulatory Visit (INDEPENDENT_AMBULATORY_CARE_PROVIDER_SITE_OTHER): Payer: 59

## 2023-03-27 DIAGNOSIS — G459 Transient cerebral ischemic attack, unspecified: Secondary | ICD-10-CM | POA: Diagnosis not present

## 2023-03-27 LAB — CUP PACEART REMOTE DEVICE CHECK
Date Time Interrogation Session: 20240818231658
Implantable Pulse Generator Implant Date: 20220721

## 2023-04-03 ENCOUNTER — Ambulatory Visit: Payer: 59

## 2023-04-06 NOTE — Progress Notes (Signed)
[facility] Summary Report / Loop Recorder 

## 2023-05-01 ENCOUNTER — Ambulatory Visit: Payer: 59

## 2023-05-08 ENCOUNTER — Ambulatory Visit: Payer: 59

## 2023-05-12 IMAGING — MR MR HEAD W/O CM
12 of 13 series · 44 of 48 positions shown · non-contrast
Comparison: Head CT 02/22/2021. Head and neck CTA 12/17/2020. Head
MRI 12/12/2020.

CLINICAL DATA: Acute neuro deficit, stroke suspected. Left-sided
weakness.



[Series 5: DWI · axial · 3.0mm · 0.88mm/px · z∈[-66,+86]mm · 8 of 104 slices shown (1 of 4)]
[im 1/104]
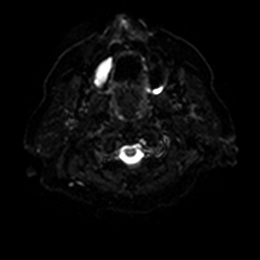
[im 15/104]
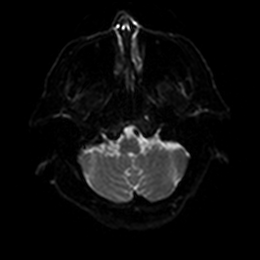
[im 30/104]
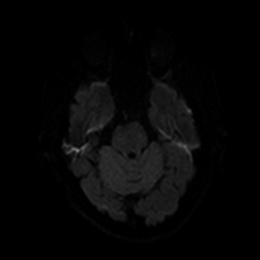
[im 45/104]
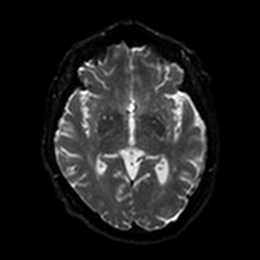
[im 59/104]
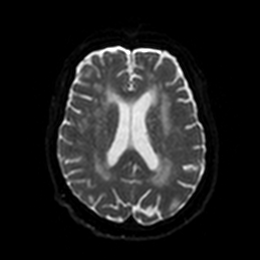
[im 74/104]
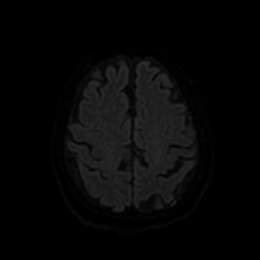
[im 89/104]
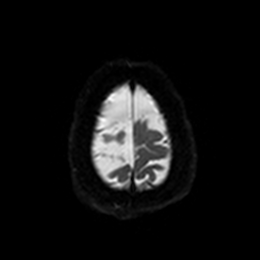
[im 104/104]
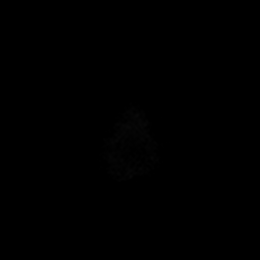

[Series 6: DWI · axial · 3.0mm · 0.88mm/px · z∈[-66,+86]mm · 4 of 52 slices shown (2 of 4)]
[im 1/52]
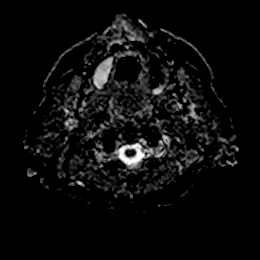
[im 18/52]
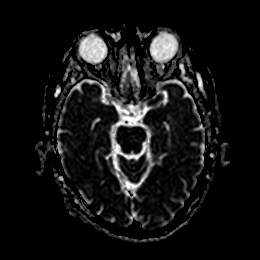
[im 35/52]
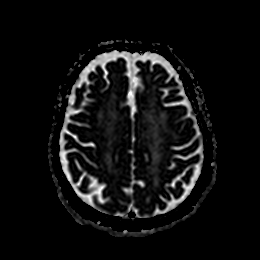
[im 52/52]
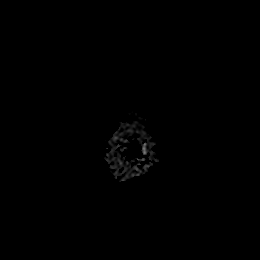

[Series 7: DWI · coronal · 4.0mm · 0.88mm/px · 5 of 72 slices shown (3 of 4)]
[im 1/72]
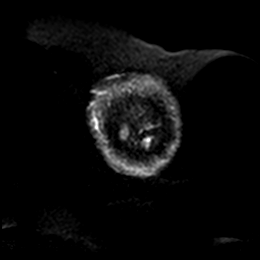
[im 18/72]
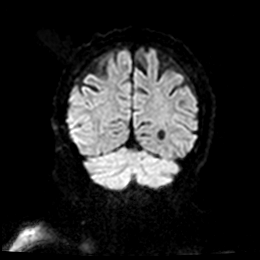
[im 36/72]
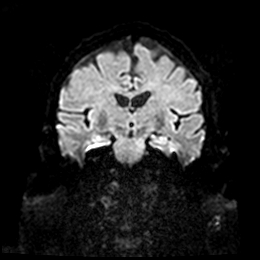
[im 54/72]
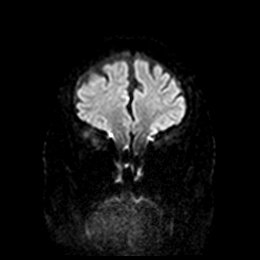
[im 72/72]
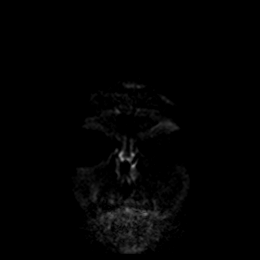

[Series 8: DWI · coronal · 4.0mm · 0.88mm/px · 3 of 36 slices shown (4 of 4)]
[im 1/36]
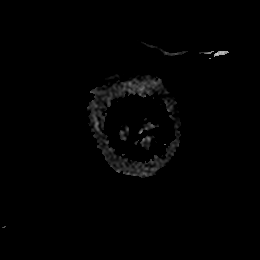
[im 18/36]
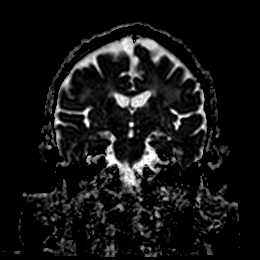
[im 36/36]
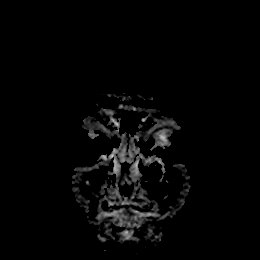

[Series 9: T1 · sagittal · 5.0mm · 0.75mm/px · 2 of 23 slices shown]
[im 1/23]
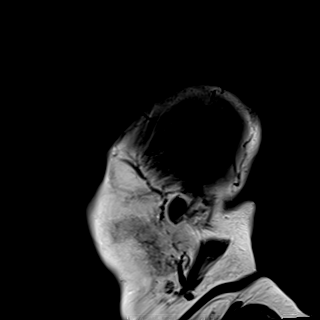
[im 23/23]
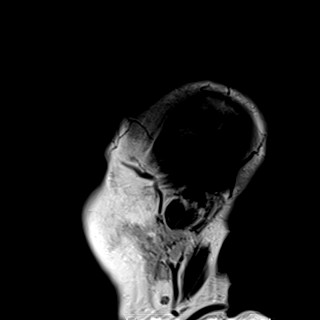

[Series 10: T2 · axial · 5.0mm · 0.72mm/px · z∈[-66,+76]mm · 2 of 25 slices shown (1 of 2)]
[im 1/25]
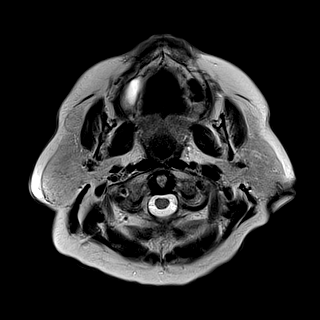
[im 25/25]
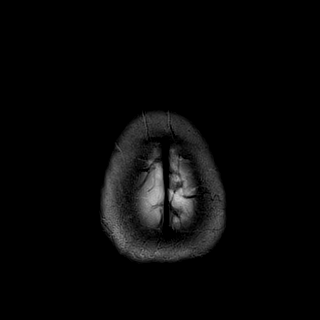

[Series 11: FLAIR · axial · 5.0mm · 0.45mm/px · z∈[-65,+77]mm · 2 of 25 slices shown]
[im 1/25]
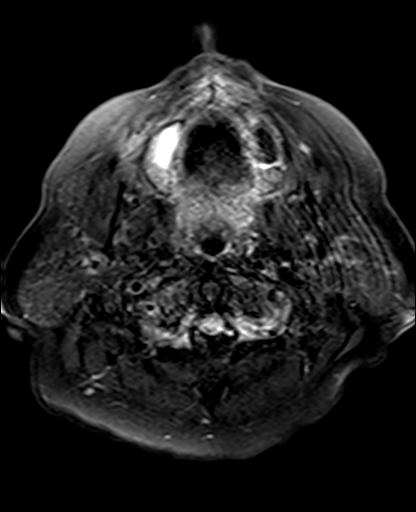
[im 25/25]
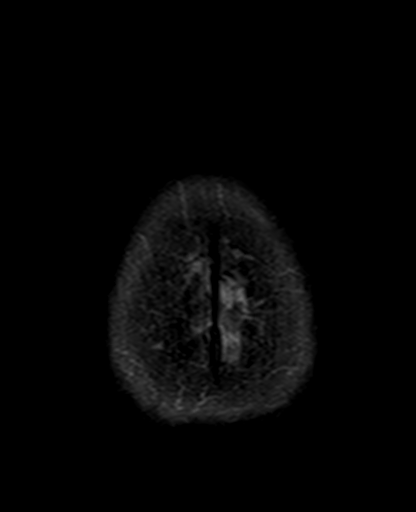

[Series 12: mag_images · axial · 3.0mm · 0.90mm/px · z∈[-76,+99]mm · 4 of 60 slices shown]
[im 1/60]
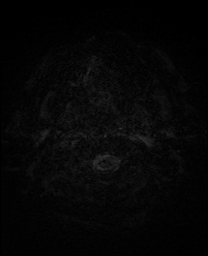
[im 20/60]
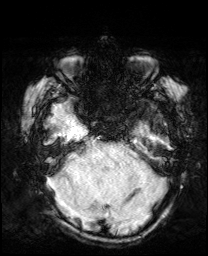
[im 40/60]
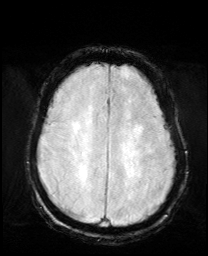
[im 60/60]
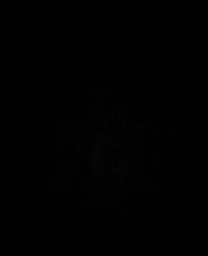

[Series 13: pha_images · axial · 3.0mm · 0.90mm/px · z∈[-76,+99]mm · 4 of 57 slices shown]
[im 1/57]
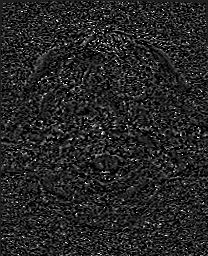
[im 19/57]
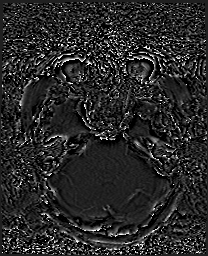
[im 38/57]
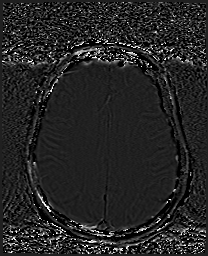
[im 57/57]
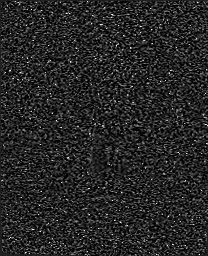

[Series 14: swi_images · axial · 3.0mm · 0.90mm/px · z∈[-76,+99]mm · 4 of 60 slices shown]
[im 1/60]
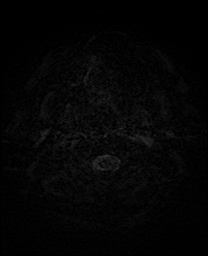
[im 20/60]
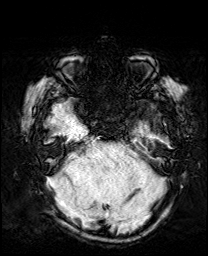
[im 40/60]
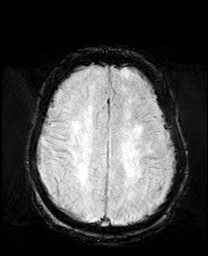
[im 60/60]
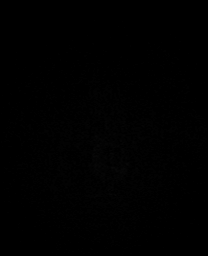

[Series 15: mip_images(sw) · axial · 24.0mm · 0.90mm/px · z∈[-66,+89]mm · 4 of 53 slices shown]
[im 1/53]
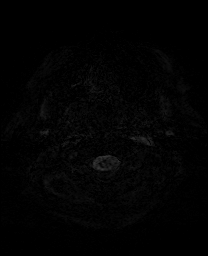
[im 18/53]
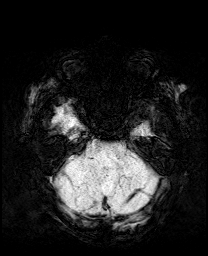
[im 35/53]
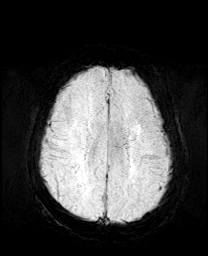
[im 53/53]
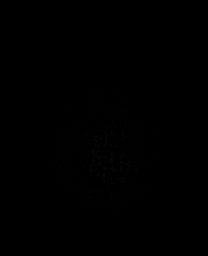

[Series 17: T2 · coronal · 5.0mm · 0.34mm/px · 2 of 29 slices shown (2 of 2)]
[im 1/29]
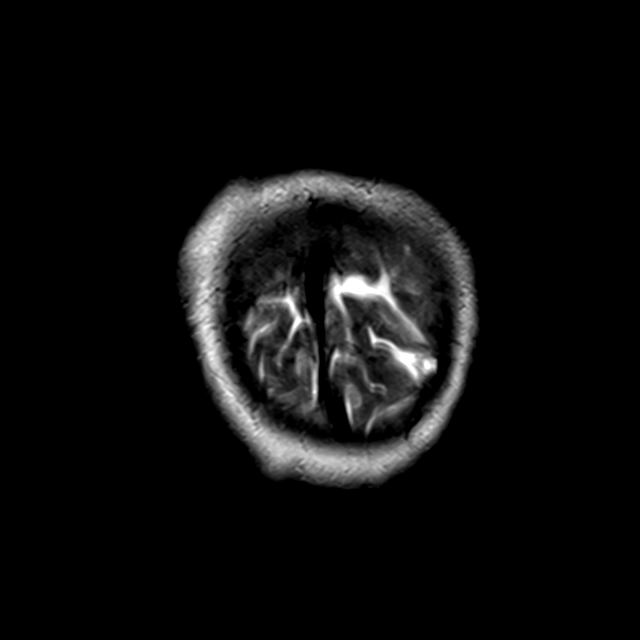
[im 29/29]
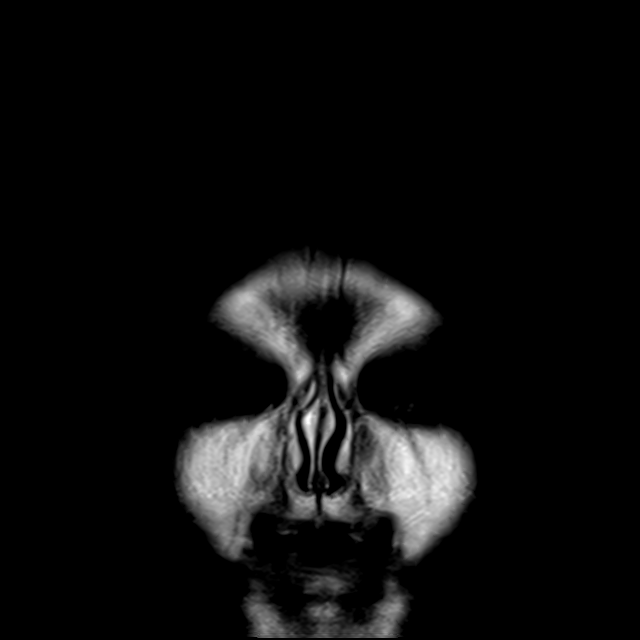

[44 of 48 positions shown; findings below may reference images not displayed]

FINDINGS: MRI HEAD FINDINGS

The study is mildly motion degraded.

Brain: There is no evidence of an acute infarct, mass, or midline
shift. Confluent T2 hyperintensities in the cerebral white matter
bilaterally are unchanged from the prior MRI and are nonspecific but
compatible with severe chronic small vessel ischemic disease. There
is mild cerebral atrophy.

Small bilateral subdural fluid collections over both cerebral
convexities on the prior MRI of decreased in size with a residual 4
mm thick collection on the right and trace collection on the left.
These are again noted to largely follow CSF in signal intensity,
however there are some scattered foci of susceptibility artifact
over the convexities suggesting that these may reflect chronic
subdural hematomas rather than simple hygromas.

Vascular: Major intracranial vascular flow voids are preserved.

Skull and upper cervical spine: Unremarkable bone marrow signal.

Sinuses/Orbits: Unremarkable orbits. Small mucous retention cysts in
the maxillary sinuses, right larger than left. Clear mastoid air
cells.

Other: None.

MRA HEAD FINDINGS

The study is mildly motion degraded.

The intracranial vertebral arteries are widely patent to the basilar
with the left being dominant. Patent PICA and SCA origins are seen
bilaterally. Basilar artery is widely patent. Both PCAs are patent
with mild irregularity but no flow limiting proximal stenosis.

The internal carotid arteries are patent from skull base to carotid
termini. Multifocal signal loss involving the petrous, cavernous,
and proximal supraclinoid segments bilaterally is favored to reflect
artifact, without a significant stenosis demonstrated on the recent
prior CTA. ACAs and MCAs are patent with mild irregularity but no
evidence of a flow limiting proximal stenosis or proximal branch
occlusion. No aneurysm is identified.

MRA NECK FINDINGS

Image quality is degraded by noncontrast technique, moderate motion,
and wrap artifact. The aortic arch, arch vessel origins, and
proximal aspects of the common carotid and vertebral arteries are
not well evaluated. The mid and distal portions of the common
carotid arteries as well as the cervical internal carotid arteries
are patent without a definite flow limiting stenosis within
limitations of motion.

The vertebral arteries are patent with antegrade flow bilaterally
with the left being moderately dominant. The V1 segments are not
well evaluated, and there is artifactual signal loss in both V3
segments distally. No definite flow limiting stenosis is evident in
the V2 or proximal V3 segments. Apparent mild narrowing of the
proximal to mid left V2 segment on reformats may be due to artifact
based on source images.
IMPRESSION: 1. No acute intracranial abnormality.
2. Severe chronic small vessel ischemic disease.
3. Small residual bilateral subdural fluid collections as above,
decreased in size from 12/12/2020 and without mass effect.
4. Motion degraded head and neck MRAs.  No large vessel occlusion.

## 2023-06-05 ENCOUNTER — Ambulatory Visit: Payer: 59

## 2023-06-12 ENCOUNTER — Ambulatory Visit: Payer: 59

## 2023-07-10 ENCOUNTER — Ambulatory Visit: Payer: 59

## 2023-07-17 ENCOUNTER — Ambulatory Visit: Payer: 59

## 2023-08-14 ENCOUNTER — Ambulatory Visit: Payer: 59

## 2023-09-18 ENCOUNTER — Ambulatory Visit: Payer: 59

## 2023-10-23 ENCOUNTER — Ambulatory Visit: Payer: 59

## 2023-11-27 ENCOUNTER — Ambulatory Visit: Payer: 59

## 2024-02-05 ENCOUNTER — Ambulatory Visit: Payer: 59

## 2024-03-11 ENCOUNTER — Ambulatory Visit: Payer: 59

## 2024-04-15 ENCOUNTER — Ambulatory Visit: Payer: 59

## 2024-05-20 ENCOUNTER — Ambulatory Visit: Payer: 59

## 2024-05-20 ENCOUNTER — Ambulatory Visit: Payer: Self-pay

## 2024-06-20 ENCOUNTER — Ambulatory Visit: Payer: Self-pay

## 2024-06-24 ENCOUNTER — Ambulatory Visit: Payer: 59

## 2024-07-21 ENCOUNTER — Ambulatory Visit: Payer: Self-pay

## 2024-07-22 ENCOUNTER — Ambulatory Visit: Payer: Self-pay

## 2024-07-29 ENCOUNTER — Ambulatory Visit: Payer: 59

## 2024-08-21 ENCOUNTER — Ambulatory Visit: Payer: Self-pay

## 2024-08-22 ENCOUNTER — Ambulatory Visit: Payer: Self-pay

## 2024-09-02 ENCOUNTER — Ambulatory Visit: Payer: 59

## 2024-09-21 ENCOUNTER — Ambulatory Visit: Payer: Self-pay

## 2024-09-23 ENCOUNTER — Ambulatory Visit: Payer: Self-pay

## 2024-10-07 ENCOUNTER — Ambulatory Visit: Payer: 59

## 2024-10-22 ENCOUNTER — Ambulatory Visit: Payer: Self-pay

## 2024-10-24 ENCOUNTER — Ambulatory Visit: Payer: Self-pay

## 2024-11-11 ENCOUNTER — Ambulatory Visit: Payer: 59

## 2024-11-22 ENCOUNTER — Ambulatory Visit: Payer: Self-pay

## 2024-11-25 ENCOUNTER — Ambulatory Visit: Payer: Self-pay

## 2024-12-16 ENCOUNTER — Ambulatory Visit: Payer: 59

## 2024-12-23 ENCOUNTER — Ambulatory Visit: Payer: Self-pay

## 2025-01-20 ENCOUNTER — Ambulatory Visit: Payer: 59

## 2025-01-23 ENCOUNTER — Ambulatory Visit: Payer: Self-pay

## 2025-02-24 ENCOUNTER — Ambulatory Visit: Payer: 59

## 2025-03-31 ENCOUNTER — Ambulatory Visit: Payer: 59

## 2025-05-05 ENCOUNTER — Ambulatory Visit: Payer: 59

## 2025-06-09 ENCOUNTER — Ambulatory Visit: Payer: 59
